# Patient Record
Sex: Male | Born: 1940 | Race: White | Hispanic: No | Smoking: Never smoker
Health system: Southern US, Community
[De-identification: ages and names within clinical notes are randomized; demographics above are authoritative.]

## PROBLEM LIST (undated history)

## (undated) DIAGNOSIS — E785 Hyperlipidemia, unspecified: Secondary | ICD-10-CM

## (undated) DIAGNOSIS — I10 Essential (primary) hypertension: Secondary | ICD-10-CM

## (undated) DIAGNOSIS — N400 Enlarged prostate without lower urinary tract symptoms: Secondary | ICD-10-CM

## (undated) HISTORY — DX: Hyperlipidemia, unspecified: E78.5

## (undated) HISTORY — DX: Benign prostatic hyperplasia without lower urinary tract symptoms: N40.0

## (undated) HISTORY — DX: Essential (primary) hypertension: I10

---

## 1978-03-13 HISTORY — PX: FINGER CONTRACTURE RELEASE: SHX637

## 2007-01-30 ENCOUNTER — Ambulatory Visit: Payer: Self-pay | Admitting: Internal Medicine

## 2007-01-30 DIAGNOSIS — I739 Peripheral vascular disease, unspecified: Secondary | ICD-10-CM

## 2007-01-30 DIAGNOSIS — G20 Parkinson's disease: Secondary | ICD-10-CM | POA: Insufficient documentation

## 2007-01-30 DIAGNOSIS — R7301 Impaired fasting glucose: Secondary | ICD-10-CM | POA: Insufficient documentation

## 2007-02-05 ENCOUNTER — Encounter: Payer: Self-pay | Admitting: Family Medicine

## 2007-02-05 ENCOUNTER — Ambulatory Visit: Payer: Self-pay

## 2007-02-25 ENCOUNTER — Ambulatory Visit: Payer: Self-pay | Admitting: Internal Medicine

## 2007-05-16 ENCOUNTER — Encounter: Payer: Self-pay | Admitting: Internal Medicine

## 2007-05-23 ENCOUNTER — Encounter: Payer: Self-pay | Admitting: Internal Medicine

## 2007-05-30 LAB — CONVERTED CEMR LAB
ALT: 28 U/L
AST: 20 U/L
Albumin: 4.3 g/dL
Alkaline Phosphatase: 52 U/L
BUN: 15 mg/dL
Basophils Absolute: 0 K/uL
Basophils Relative: 0.1 %
Bilirubin, Direct: 0.1 mg/dL
CO2: 30 meq/L
Calcium: 9.4 mg/dL
Chloride: 106 meq/L
Cholesterol: 198 mg/dL
Creatinine, Ser: 0.9 mg/dL
Creatinine,U: 168.7 mg/dL
Eosinophils Absolute: 0 K/uL
Eosinophils Relative: 0.3 %
GFR calc Af Amer: 108 mL/min
GFR calc non Af Amer: 89 mL/min
Glucose, Bld: 161 mg/dL — ABNORMAL HIGH
HCT: 45.8 %
HDL: 39.9 mg/dL
Hemoglobin: 15.9 g/dL
Hgb A1c MFr Bld: 6.6 % — ABNORMAL HIGH
LDL Cholesterol: 142 mg/dL — ABNORMAL HIGH
Lymphocytes Relative: 17.2 %
MCHC: 34.6 g/dL
MCV: 92.4 fL
Microalb Creat Ratio: 19.6 mg/g
Microalb, Ur: 3.3 mg/dL — ABNORMAL HIGH
Monocytes Absolute: 0.5 K/uL
Monocytes Relative: 6.2 %
Neutro Abs: 5.8 K/uL
Neutrophils Relative %: 76.2 %
PSA: 0.69 ng/mL
Phosphorus: 3.2 mg/dL
Platelets: 152 K/uL
Potassium: 4.4 meq/L
RBC: 4.96 M/uL
RDW: 12.5 %
Sed Rate: 4 mm/h
Sodium: 142 meq/L
TSH: 1.05 u[IU]/mL
Total Bilirubin: 0.8 mg/dL
Total CHOL/HDL Ratio: 5
Total CK: 114 U/L
Total Protein: 7.1 g/dL
Triglycerides: 83 mg/dL
VLDL: 17 mg/dL
WBC: 7.6 10*3/microliter

## 2007-06-14 ENCOUNTER — Encounter: Payer: Self-pay | Admitting: Internal Medicine

## 2007-06-24 ENCOUNTER — Encounter: Payer: Self-pay | Admitting: Internal Medicine

## 2007-07-26 ENCOUNTER — Encounter: Payer: Self-pay | Admitting: Internal Medicine

## 2007-08-30 ENCOUNTER — Ambulatory Visit: Payer: Self-pay | Admitting: Internal Medicine

## 2007-08-30 DIAGNOSIS — E785 Hyperlipidemia, unspecified: Secondary | ICD-10-CM | POA: Insufficient documentation

## 2007-08-30 DIAGNOSIS — M720 Palmar fascial fibromatosis [Dupuytren]: Secondary | ICD-10-CM

## 2007-09-02 LAB — CONVERTED CEMR LAB
BUN: 20 mg/dL (ref 6–23)
Basophils Relative: 0 % (ref 0.0–1.0)
Calcium: 8.8 mg/dL (ref 8.4–10.5)
Creatinine, Ser: 1 mg/dL (ref 0.4–1.5)
Creatinine,U: 157.4 mg/dL
GFR calc Af Amer: 95 mL/min
GFR calc non Af Amer: 79 mL/min
Glucose, Bld: 122 mg/dL — ABNORMAL HIGH (ref 70–99)
Hgb A1c MFr Bld: 6.2 % — ABNORMAL HIGH (ref 4.6–6.0)
Microalb Creat Ratio: 8.9 mg/g (ref 0.0–30.0)
Monocytes Absolute: 0.5 10*3/uL (ref 0.1–1.0)
Monocytes Relative: 7.7 % (ref 3.0–12.0)
Neutro Abs: 4.3 10*3/uL (ref 1.4–7.7)
Neutrophils Relative %: 68.3 % (ref 43.0–77.0)
Phosphorus: 3.3 mg/dL (ref 2.3–4.6)
Potassium: 4.1 meq/L (ref 3.5–5.1)
RDW: 12.4 % (ref 11.5–14.6)
TSH: 1.28 microintl units/mL (ref 0.35–5.50)

## 2009-05-19 ENCOUNTER — Emergency Department: Payer: Self-pay | Admitting: Internal Medicine

## 2009-06-14 ENCOUNTER — Ambulatory Visit: Payer: Self-pay | Admitting: Internal Medicine

## 2009-06-14 DIAGNOSIS — N401 Enlarged prostate with lower urinary tract symptoms: Secondary | ICD-10-CM

## 2009-06-14 DIAGNOSIS — N138 Other obstructive and reflux uropathy: Secondary | ICD-10-CM

## 2009-06-14 LAB — CONVERTED CEMR LAB
Glucose, Urine, Semiquant: NEGATIVE
Nitrite: NEGATIVE
WBC Urine, dipstick: NEGATIVE

## 2009-06-14 LAB — HM DIABETES FOOT EXAM

## 2009-06-15 LAB — CONVERTED CEMR LAB
ALT: 19 units/L (ref 0–53)
AST: 16 units/L (ref 0–37)
Alkaline Phosphatase: 62 units/L (ref 39–117)
BUN: 14 mg/dL (ref 6–23)
Bilirubin, Direct: 0.1 mg/dL (ref 0.0–0.3)
Creatinine, Ser: 1 mg/dL (ref 0.4–1.5)
Eosinophils Absolute: 0.2 10*3/uL (ref 0.0–0.7)
GFR calc non Af Amer: 78.32 mL/min (ref >60)
Lymphocytes Relative: 17.8 % (ref 12.0–46.0)
MCHC: 35.6 g/dL (ref 30.0–36.0)
MCV: 90.2 fL (ref 78.0–100.0)
Monocytes Absolute: 0.5 10*3/uL (ref 0.1–1.0)
Platelets: 166 10*3/uL (ref 150.0–400.0)
Potassium: 4.4 meq/L (ref 3.5–5.1)
RBC: 4.94 M/uL (ref 4.22–5.81)
RDW: 13.3 % (ref 11.5–14.6)
Sodium: 144 meq/L (ref 135–145)
Total Bilirubin: 0.5 mg/dL (ref 0.3–1.2)

## 2009-09-07 ENCOUNTER — Ambulatory Visit: Payer: Self-pay | Admitting: Internal Medicine

## 2009-09-07 DIAGNOSIS — I10 Essential (primary) hypertension: Secondary | ICD-10-CM

## 2009-10-17 ENCOUNTER — Encounter: Payer: Self-pay | Admitting: Internal Medicine

## 2009-10-17 ENCOUNTER — Inpatient Hospital Stay (HOSPITAL_COMMUNITY): Admission: EM | Admit: 2009-10-17 | Discharge: 2009-10-26 | Payer: Self-pay | Admitting: Emergency Medicine

## 2009-10-20 ENCOUNTER — Ambulatory Visit: Payer: Self-pay | Admitting: Infectious Diseases

## 2009-10-25 ENCOUNTER — Ambulatory Visit: Payer: Self-pay | Admitting: Physical Medicine & Rehabilitation

## 2009-10-25 ENCOUNTER — Telehealth: Payer: Self-pay | Admitting: Internal Medicine

## 2009-10-26 ENCOUNTER — Inpatient Hospital Stay (HOSPITAL_COMMUNITY)
Admission: RE | Admit: 2009-10-26 | Discharge: 2009-11-08 | Payer: Self-pay | Admitting: Physical Medicine & Rehabilitation

## 2009-10-29 ENCOUNTER — Ambulatory Visit: Payer: Self-pay | Admitting: Physical Medicine & Rehabilitation

## 2009-11-08 ENCOUNTER — Encounter: Payer: Self-pay | Admitting: Internal Medicine

## 2009-11-22 ENCOUNTER — Ambulatory Visit: Payer: Self-pay | Admitting: Internal Medicine

## 2009-11-22 LAB — CONVERTED CEMR LAB
Blood in Urine, dipstick: NEGATIVE
WBC Urine, dipstick: NEGATIVE
pH: 6

## 2009-12-24 ENCOUNTER — Ambulatory Visit: Payer: Self-pay | Admitting: Internal Medicine

## 2009-12-24 DIAGNOSIS — I872 Venous insufficiency (chronic) (peripheral): Secondary | ICD-10-CM | POA: Insufficient documentation

## 2010-02-23 ENCOUNTER — Ambulatory Visit: Payer: Self-pay | Admitting: Internal Medicine

## 2010-03-09 ENCOUNTER — Ambulatory Visit: Payer: Self-pay | Admitting: Internal Medicine

## 2010-04-14 NOTE — Miscellaneous (Signed)
Summary: Case Mgmt Form/[facility]  Case Mgmt Form/[facility]   Imported By: Lanelle Bal 12/03/2009 09:08:34  _____________________________________________________________________  External Attachment:    Type:   Image     Comment:   External Document

## 2010-04-14 NOTE — Assessment & Plan Note (Signed)
Summary: 4 M F/U DLO   Vital Signs:  Patient profile:   70 year old male Height:      70.75 inches Weight:      212 pounds Temp:     98.4 degrees F oral Pulse rate:   60 / minute Pulse rhythm:   regular BP sitting:   150 / 90  (left arm) Cuff size:   large  Vitals Entered By: Mervin Hack CMA Duncan Dull) (September 07, 2009 10:26 AM) CC: 57month follow-up   History of Present Illness: doing okay  went to another neurologist-- Dr Hyacinth Meeker in [facility] started zelink--MAOI inhibitor didn't help after 5 weeks so stopped didn't try the tamsulosin because of this Stiffness is some better with peanut oil topically  Now on saw palmetto sleeping through the night some daytime frequency but not troubling  BP still up a bit he feels it is something he can work on   Allergies: No Known Drug Allergies  Past History:  Past medical, surgical, family and social histories (including risk factors) reviewed for relevance to current acute and chronic problems.  Past Medical History: Diabetes mellitus, type II 2001  Has been diet controlled Parkinsons Hyperlipidemia Benign prostatic hypertrophy Hypertension  Past Surgical History: Reviewed history from 06/14/2009 and no changes required. 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 Dad died of CVA @76  Mom died @84 -- had jaundice 5 siblings 1 sister died as teen of meningitis No CAD, DM Mom had HTN No colon or prostate cancer  Social History: Reviewed history from 01/30/2007 and no changes required. 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     Psych:  normally interactive, good eye contact, not anxious appearing, and not depressed appearing.     Impression & Recommendations:  Problem # 1:  BENIGN PROSTATIC HYPERTROPHY (ICD-600.00) Assessment Comment Only doing okay on saw palmetto only took flomax for 1 week--not clear if it helped  Problem # 2:  PARKINSONISM (ICD-332.0) Assessment: Comment Only now seeing Dr Hyacinth Meeker  no meds failed sinemet twice  Problem # 3:  DIABETES MELLITUS, TYPE II (ICD-250.00) Assessment: Unchanged good control  Labs Reviewed: Creat: 1.0 (06/14/2009)    Reviewed HgBA1c results: 6.0 (06/14/2009)  6.2 (08/30/2007)  Problem # 4:  HYPERTENSION (ICD-401.9) Assessment: Comment Only discussed borderline positive microal also recommended meds--he prefers not  BP today: 150/90 Prior BP: 168/88 (06/14/2009)  Labs Reviewed: K+: 4.4 (06/14/2009) Creat: : 1.0 (06/14/2009)   Chol: 198 (01/30/2007)   HDL: 39.9 (01/30/2007)   LDL: 142 (01/30/2007)   TG: 83 (01/30/2007)  Complete Medication List: 1)  Vitamin D3 5000 Unit/ml Liqd (Cholecalciferol) .... Once daily 2)  Alpha-lipoic Acid 300 Mg Caps (Alpha-lipoic acid) .... Once daily 3)  Acetyl L-carnitine 500 Mg Caps (Acetylcarnitine hcl) .... Once daily 4)  Ginkgo Biloba 40 Mg Caps (Ginkgo biloba) .... Once daily 5)  Vitamin B-12 100 Mcg Tabs (Cyanocobalamin) .... Once daily 6)  Resveratrol 100 Mg Caps (Resveratrol) .... Once daily 7)  Cyto-q 80 Mg/84ml Liqd (Ubiquinol liposomal) .... Once daily 8)  Nattokinase 100 Mg Caps (Nattokinase) .... Once daily 9)  Fish Oil 1000  Mg Caps (Omega-3 fatty acids) .... Once daily 10)  Oil of Oregano 1500 Mg Caps (Oregano) .... Once daily 11)  Selenium 200 Mcg Tabs (Selenium) .... Once daily 12)  Garlic Oil 1000 Mg Caps (Garlic) .... Once daily 13)  Saw Palmetto 450 Mg Caps (Saw palmetto (serenoa repens)) .... Take 1 by mouth once daily  Patient Instructions: 1)  Please schedule a follow-up appointment in 6  months .   Current Allergies (reviewed today): No known allergies

## 2010-04-14 NOTE — Assessment & Plan Note (Signed)
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 Dad died of CVA @76  Mom died @84 -- had jaundice 5 siblings 1 sister died as teen of meningitis No CAD, DM Mom had HTN No colon or prostate cancer  Social History: Reviewed history from 01/30/2007 and no changes required. 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  NAD Neck:  supple,  no masses, no thyromegaly, no carotid bruits, and no cervical lymphadenopathy.   Lungs:  normal respiratory effort and normal breath sounds.   Heart:  normal rate, regular rhythm, no murmur, and no gallop.   Abdomen:  soft and non-tender.   No suprapubic dullness Neurologic:  moderate bradykinesia scant resting tremor Mild cog wheel rigidity Skin:  no suspicious lesions and no ulcerations.   Psych:  normally interactive, good eye contact, not anxious appearing, and not depressed appearing.    Diabetes Management Exam:    Foot Exam (with socks and/or shoes not present):       Sensory-Pinprick/Light touch:          Left medial foot (L-4): normal          Left dorsal foot (L-5): normal          Left lateral foot (S-1): normal          Right medial foot (L-4): normal          Right dorsal foot (L-5): normal          Right lateral foot (S-1): normal       Inspection:          Left foot: normal          Right foot: normal       Nails:          Left foot: fungal  infection          Right foot: fungal infection   Impression & Recommendations:  Problem # 1:  BENIGN PROSTATIC HYPERTROPHY (ICD-600.00) Assessment New  recent UTI but no now highly symptomatic will try tamsulosin---discussed possible orthostasis change to finasteride if not better  Orders: Prescription Created Electronically [phone]) UA Dipstick w/o Micro (manual) (60454)  Problem # 2:  DIABETES MELLITUS, TYPE II (ICD-250.00) Assessment: Unchanged  will recheck labs doesn't check and doesn't want meds BP up some but not clear this is consistent---would not start meds when starting tamsulosin anyway  Labs Reviewed: Creat: 1.0 (08/30/2007)    Reviewed HgBA1c results: 6.2 (08/30/2007)  6.6 (01/30/2007)  Orders: TLB-A1C / Hgb A1C (Glycohemoglobin) (83036-A1C) TLB-Renal Function Panel (80069-RENAL) TLB-CBC Platelet - w/Differential (85025-CBCD) TLB-Hepatic/Liver Function Pnl (80076-HEPATIC) TLB-TSH (Thyroid  Stimulating Hormone) (84443-TSH) Venipuncture (09811) TLB-Microalbumin/Creat Ratio, Urine (82043-MALB)  Problem # 3:  PARKINSONISM (ICD-332.0) Assessment: Deteriorated slow deterioration Dr Kennyth Arnold manages  Complete Medication List: 1)  Tamsulosin Hcl 0.4 Mg Caps (Tamsulosin hcl) .Marland Kitchen.. 1 tab daily for enlarged prostate 2)  Vitamin D3 5000 Unit/ml Liqd (Cholecalciferol) .... Once daily 3)  Alpha-lipoic Acid 300 Mg Caps (Alpha-lipoic acid) .... Once daily 4)  Acetyl L-carnitine 500 Mg Caps (Acetylcarnitine hcl) .... Once daily 5)  Ginkgo Biloba 40 Mg Caps (Ginkgo biloba) .... Once daily 6)  Vitamin B-12 100 Mcg Tabs (Cyanocobalamin) .... Once daily 7)  Resveratrol 100 Mg Caps (Resveratrol) .... Once daily 8)  Cyto-q 80 Mg/28ml Liqd (Ubiquinol liposomal) .... Once daily 9)  Nattokinase 100 Mg Caps (Nattokinase) .... Once daily 10)  Fish Oil 1000 Mg Caps (Omega-3 fatty acids) .... Once daily 11)  Oil of Oregano 1500 Mg Caps (Oregano) .... Once daily 12)  Selenium 200 Mcg Tabs (Selenium) .... Once daily 13)  Garlic Oil 1000 Mg Caps (Garlic) .... Once daily  Patient Instructions: 1)  Please call within about 2 weeks if the tamsulosin doesn't work. 2)  Please schedule a follow-up appointment in 3-4  months .  Prescriptions: TAMSULOSIN HCL 0.4 MG CAPS (TAMSULOSIN HCL) 1 tab daily for enlarged prostate  #30 x 11   Entered and Authorized by:   Cindee Salt MD   Signed by:   Cindee Salt MD on 06/14/2009   Method used:   Electronically to        [facility]  [address] [id number] (retail)       [address]       [place], [place]  [address]       Ph: [phone] or [phone]       Fax: [phone]   RxID:   [id number]   Current Allergies (reviewed today): No known allergies   Laboratory Results   Urine Tests  Date/Time Received: June 14, 2009 10:38 AM Date/Time Reported: June 14, 2009 10:38 AM  Routine Urinalysis   Color: yellow Appearance: Hazy Glucose:  negative   (Normal Range: Negative) Bilirubin: negative   (Normal Range: Negative) Ketone: negative   (Normal Range: Negative) Spec. Gravity: 1.025   (Normal Range: 1.003-1.035) Blood: negative   (Normal Range: Negative) pH: 5.5   (Normal Range: 5.0-8.0) Protein: trace   (Normal Range: Negative) Urobilinogen: 0.2   (Normal Range: 0-1) Nitrite: negative   (Normal Range: Negative) Leukocyte Esterace: negative   (Normal Range: Negative)

## 2010-04-14 NOTE — Assessment & Plan Note (Signed)
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  Past Medical History: Reviewed history from 09/07/2009 and no changes required. Diabetes mellitus, type II 2001  Has been diet controlled Parkinsons Hyperlipidemia Benign prostatic hypertrophy Hypertension  Past Surgical History: Reviewed history from 06/14/2009 and no changes required. 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 Dad died of CVA @76  Mom died @84 -- had jaundice 5 siblings 1 sister died as teen of meningitis No CAD, DM Mom had HTN No colon or prostate cancer  Social History: Reviewed history from 01/30/2007 and no changes required. 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   Neck:  supple, no masses, no thyromegaly, and no cervical lymphadenopathy.   Lungs:  normal respiratory effort, no intercostal retractions, no accessory muscle use, and normal breath sounds.   Heart:  normal rate, regular rhythm, no murmur, and no gallop.   Msk:  no joint tenderness and no joint swelling.   Extremities:  no edema Neurologic:  alert & oriented X3.   Moderate bradykinesia but fairly normal tone and no sig resting tremor Slow but stable walking with cane Psych:  normally interactive, good eye contact, not anxious appearing, and not depressed appearing.     Impression & Recommendations:  Problem # 1:  EDEMA (ICD-782.3) Assessment Improved better off the amlodipine no further intervention  Problem # 2:  HYPERTENSION (ICD-401.9) Assessment: Deteriorated up some but still not unacceptable for age will try without meds but consider restarting the metoprolol  The following medications were removed from the medication list:    Metoprolol Tartrate 25 Mg Tabs (Metoprolol tartrate) .Marland Kitchen... Take 1 by mouth two times a day  BP today: 158/80 Prior BP: 130/60 (12/24/2009)  Labs Reviewed: K+: 4.4 (06/14/2009) Creat: : 1.0 (06/14/2009)   Chol: 198 (01/30/2007)   HDL: 39.9 (01/30/2007)   LDL: 142 (01/30/2007)   TG: 83 (01/30/2007)  Problem # 3:  PARKINSONISM (ICD-332.0) Assessment: Unchanged stable functional status----close  to baseline before the diarrheal illness  Problem # 4:  DIABETES MELLITUS, TYPE II (ICD-250.00) Assessment: Unchanged blood work next time  Labs Reviewed: Creat: 1.0 (06/14/2009)    Reviewed HgBA1c results: 6.0 (06/14/2009)  6.2 (08/30/2007)  Complete Medication List: 1)  Vitamin D3 5000 Unit/ml Liqd (Cholecalciferol) .... Once daily  Patient  Instructions: 1)  Please schedule a follow-up appointment in 6 months for Medicare Wellness visit   Orders Added: 1)  Est. Patient Level IV [16109]    Current Allergies (reviewed today): No known allergies

## 2010-04-14 NOTE — Progress Notes (Signed)
  Phone Note Call from Patient Call back at Home Phone [phone]   Caller: Alan Peterson Call For: [facility] Summary of Call: I called pt. to set up f/u visit from the Hospital.  Pt's son,Alan Peterson,answered and said pt. is still in the hospital and will be going to rehab after he is released from the hospital for physical therapy to help w/ his weakness.  I asked his son to call after pt. finishes rehab to schedule a f/u appt. w/ Dr.Letvak. Initial call taken by: Beau Fanny,  October 25, 2009 9:42 AM  Follow-up for Phone Call        Okay that sounds good Follow-up by: Cindee Salt MD,  October 25, 2009 10:36 AM

## 2010-04-14 NOTE — Miscellaneous (Signed)
Summary: Professional Communication/[facility]  Professional Communication/[facility]   Imported By: Sherian Rein 11/26/2009 15:06:23  _____________________________________________________________________  External Attachment:    Type:   Image     Comment:   External Document

## 2010-04-14 NOTE — Assessment & Plan Note (Signed)
Summary: F/U HOSP,REHAB/CLE   Vital Signs:  Patient profile:   70 year old male Weight:      200 pounds Temp:     98.6 degrees F oral Pulse rate:   60 / minute Pulse rhythm:   regular BP sitting:   130 / 68  (left arm) Cuff size:   large  Vitals Entered By: Mervin Hack CMA Duncan Dull) (November 22, 2009 10:52 AM) CC: hospital follow-up   History of Present Illness: Hospitalized for diarrhea--Salmonella Then rehab for 2 weeks No diarrhea now  Appetite is fine weight is down though  No strength now Can't make it to work ---has to manage over the phone Generally sleeps okay-not tired  Has had nursing care at home--1 more visit Done with formal PT  Voiding okay no dysuria  some edema predated his illness  Allergies: No Known Drug Allergies  Review of Systems       The patient complains of dyspnea on exertion.  The patient denies chest pain, syncope, and abdominal pain.         weight is down 12#  Physical Exam  General:  alert.  NAD Neck:  supple, no masses, and no thyromegaly.   Lungs:  normal respiratory effort, no intercostal retractions, no accessory muscle use, and normal breath sounds.   Heart:  normal rate, regular rhythm, no murmur, and no gallop.   Abdomen:  soft and non-tender.   Extremities:  no edema mycotic toenails Neurologic:  marked bradykinesia and mild increased tone not miuch tremor Psych:  normally interactive, good eye contact, not anxious appearing, and not depressed appearing.     Impression & Recommendations:  Problem # 1:  FATIGUE (ICD-780.79) Assessment Comment Only post hospitalization and rehab slow improvement but not near previous baseline will continue home exercises eating well  Problem # 2:  HYPERTENSION (ICD-401.9) Assessment: Unchanged BP okay no changes needed consider cutting amlodipine--esp if ongoing edema   His updated medication list for this problem includes:    Amlodipine Besylate 10 Mg Tabs  (Amlodipine besylate) .Marland Kitchen... Take 1 by mouth  once daily    Metoprolol Tartrate 25 Mg Tabs (Metoprolol tartrate) .Marland Kitchen... Take 1 by mouth two times a day  BP today: 130/68 Prior BP: 150/90 (09/07/2009)  Labs Reviewed: K+: 4.4 (06/14/2009) Creat: : 1.0 (06/14/2009)   Chol: 198 (01/30/2007)   HDL: 39.9 (01/30/2007)   LDL: 142 (01/30/2007)   TG: 83 (01/30/2007)  Problem # 3:  BENIGN PROSTATIC HYPERTROPHY (ICD-600.00) Assessment: Unchanged voiding okay without meds or supplements for now  Complete Medication List: 1)  Amlodipine Besylate 10 Mg Tabs (Amlodipine besylate) .... Take 1 by mouth  once daily 2)  Metoprolol Tartrate 25 Mg Tabs (Metoprolol tartrate) .... Take 1 by mouth two times a day 3)  Vitamin D3 5000 Unit/ml Liqd (Cholecalciferol) .... Once daily  Patient Instructions: 1)  Please schedule a follow-up appointment in 1 month.   Current Allergies (reviewed today): No known allergies   Laboratory Results   Urine Tests  Date/Time Received: November 22, 2009 11:31 AM Date/Time Reported: November 22, 2009 11:31 AM  Routine Urinalysis   Color: yellow Appearance: Hazy Glucose: negative   (Normal Range: Negative) Bilirubin: negative   (Normal Range: Negative) Ketone: negative   (Normal Range: Negative) Spec. Gravity: 1.020   (Normal Range: 1.003-1.035) Blood: negative   (Normal Range: Negative) pH: 6.0   (Normal Range: 5.0-8.0) Protein: negative   (Normal Range: Negative) Urobilinogen: 0.2   (Normal Range: 0-1) Nitrite: negative   (  Normal Range: Negative) Leukocyte Esterace: negative   (Normal Range: Negative)

## 2010-04-14 NOTE — Assessment & Plan Note (Signed)
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  Past Medical History: Reviewed history from 09/07/2009 and no changes required. Diabetes mellitus, type II 2001  Has been diet controlled Parkinsons Hyperlipidemia Benign prostatic hypertrophy Hypertension  Past Surgical History: Reviewed history from 06/14/2009 and no changes required. 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 Dad died of CVA @76  Mom died @84 -- had jaundice 5 siblings 1 sister died as teen of meningitis No CAD, DM Mom had HTN No colon or prostate cancer  Social History: Reviewed history from 01/30/2007 and no changes required. 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   Neck:  supple, no masses, and no thyromegaly.   Lungs:  normal respiratory effort, no intercostal retractions, no accessory muscle use, and normal breath sounds.   Heart:  normal rate, regular rhythm, no murmur, and no gallop.   Extremities:  1+ non pitting edema bilaterally Neurologic:  mild increased tone mild bradykinesia flat facial expression Psych:  not anxious appearing and not depressed appearing.     Impression & Recommendations:  Problem # 1:  EDEMA (ICD-782.3) Assessment New may be from the amlodipine ??from malnurtrition and decreased proteins  BP fine so will try off the amlodipine  Problem # 2:  HYPERTENSION (ICD-401.9) Assessment: Unchanged good control try off the amlodipine  The following medications were removed from the medication list:    Amlodipine Besylate 10 Mg Tabs (Amlodipine besylate) .Marland Kitchen... Take 1 by mouth  once daily His updated medication list for this problem includes:    Metoprolol Tartrate 25 Mg Tabs (Metoprolol tartrate) .Marland Kitchen... Take 1 by mouth two times a day  BP today: 130/60 Prior BP: 130/68 (11/22/2009)  Labs Reviewed: K+: 4.4 (06/14/2009) Creat: : 1.0 (06/14/2009)   Chol: 198 (01/30/2007)   HDL: 39.9 (01/30/2007)   LDL: 142 (01/30/2007)   TG: 83 (01/30/2007)  Problem # 3:  PARKINSONISM (ICD-332.0) Assessment: Comment Only stable status has slowed his recovery  Complete Medication List: 1)  Metoprolol Tartrate 25 Mg Tabs (Metoprolol tartrate) .... Take 1 by mouth two times a day 2)  Vitamin D3 5000 Unit/ml Liqd (  Cholecalciferol) .... Once daily  Patient Instructions: 1)  Stop the amlodipine 2)  Please schedule a follow-up appointment in 2 months.   Current Allergies (reviewed today): No known allergies

## 2010-04-14 NOTE — Letter (Signed)
Summary: [facility] Triage Call Report  [facility] Triage Call Report   Imported By: Beau Fanny 10/18/2009 11:15:56  _____________________________________________________________________  External Attachment:    Type:   Image     Comment:   External Document  Appended Document: [facility] Triage Call Report    Phone Note Outgoing Call   Summary of Call: Please check on him Initial call taken by: Cindee Salt MD,  October 18, 2009 11:35 AM  Follow-up for Phone Call        Left message on machine for patient to call back. Sydell Axon LPN  October 18, 2009 1:17 PM  Spoke to patient's son and was advised that he took his dad to [facility] yesterday and they kept him.  Patient' son stated that they have been giving him IV fluids, running test and trying to find out why he is having diarrhea. Patient's son said that he is not doing any better and still having diarrhea today.  I requested patient's son [name] to keep Dr. Alphonsus Sias updated on patient's condition. Follow-up by: Sydell Axon LPN,  October 18, 2009 3:31 PM  Additional Follow-up for Phone Call Additional follow up Details #1::        In 1510 diarrhea better no diagnosis from stool studies May be able to go home tomorrow Cindee Salt MD  October 19, 2009 2:13 PM   Feeling some better Had Salmonella in urine and stool Antibiotics being adjusted Hopes to go home tomorrow will set up follow up next week Additional Follow-up by: Cindee Salt MD,  October 21, 2009 1:12 PM

## 2010-05-27 LAB — CBC
HCT: 36.2 % — ABNORMAL LOW (ref 39.0–52.0)
HCT: 37.1 % — ABNORMAL LOW (ref 39.0–52.0)
HCT: 47.8 % (ref 39.0–52.0)
Hemoglobin: 13.2 g/dL (ref 13.0–17.0)
Hemoglobin: 14.7 g/dL (ref 13.0–17.0)
MCH: 31.5 pg (ref 26.0–34.0)
MCH: 31.7 pg (ref 26.0–34.0)
MCHC: 34.5 g/dL (ref 30.0–36.0)
MCHC: 34.8 g/dL (ref 30.0–36.0)
MCHC: 34.7 g/dL (ref 30.0–36.0)
MCHC: 35.5 g/dL (ref 30.0–36.0)
MCHC: 35.5 g/dL (ref 30.0–36.0)
MCV: 88.9 fL (ref 78.0–100.0)
MCV: 89.4 fL (ref 78.0–100.0)
MCV: 90.7 fL (ref 78.0–100.0)
MCV: 91.1 fL (ref 78.0–100.0)
Platelets: 188 10*3/uL (ref 150–400)
Platelets: 192 10*3/uL (ref 150–400)
Platelets: 119 10*3/uL — ABNORMAL LOW (ref 150–400)
Platelets: 124 10*3/uL — ABNORMAL LOW (ref 150–400)
Platelets: 148 10*3/uL — ABNORMAL LOW (ref 150–400)
Platelets: 156 10*3/uL (ref 150–400)
Platelets: 172 10*3/uL (ref 150–400)
RBC: 4.45 MIL/uL (ref 4.22–5.81)
RBC: 3.99 MIL/uL — ABNORMAL LOW (ref 4.22–5.81)
RBC: 4.05 MIL/uL — ABNORMAL LOW (ref 4.22–5.81)
RBC: 4.18 MIL/uL — ABNORMAL LOW (ref 4.22–5.81)
RBC: 4.55 MIL/uL (ref 4.22–5.81)
RBC: 5.33 MIL/uL (ref 4.22–5.81)
RDW: 12.9 % (ref 11.5–15.5)
RDW: 13.1 % (ref 11.5–15.5)
RDW: 13 % (ref 11.5–15.5)
RDW: 13.4 % (ref 11.5–15.5)
RDW: 13.6 % (ref 11.5–15.5)
WBC: 7.3 10*3/uL (ref 4.0–10.5)
WBC: 5.4 10*3/uL (ref 4.0–10.5)
WBC: 6.8 10*3/uL (ref 4.0–10.5)
WBC: 7.2 10*3/uL (ref 4.0–10.5)
WBC: 7.4 10*3/uL (ref 4.0–10.5)
WBC: 8.6 10*3/uL (ref 4.0–10.5)
WBC: 9.4 10*3/uL (ref 4.0–10.5)

## 2010-05-27 LAB — URINE CULTURE
Colony Count: >=100000
Culture  Setup Time: 201108072129
Culture  Setup Time: 201108120042
Culture  Setup Time: 201108130122
Special Requests: NEGATIVE

## 2010-05-27 LAB — BASIC METABOLIC PANEL
BUN: 10 mg/dL (ref 6–23)
BUN: 11 mg/dL (ref 6–23)
BUN: 14 mg/dL (ref 6–23)
BUN: 16 mg/dL (ref 6–23)
BUN: 8 mg/dL (ref 6–23)
CO2: 30 mEq/L (ref 19–32)
CO2: 24 mEq/L (ref 19–32)
CO2: 29 mEq/L (ref 19–32)
Calcium: 7.9 mg/dL — ABNORMAL LOW (ref 8.4–10.5)
Calcium: 8.1 mg/dL — ABNORMAL LOW (ref 8.4–10.5)
Calcium: 8.3 mg/dL — ABNORMAL LOW (ref 8.4–10.5)
Chloride: 102 mEq/L (ref 96–112)
Chloride: 106 mEq/L (ref 96–112)
Chloride: 109 mEq/L (ref 96–112)
Chloride: 109 mEq/L (ref 96–112)
Chloride: 110 mEq/L (ref 96–112)
Chloride: 112 mEq/L (ref 96–112)
Creatinine, Ser: 0.73 mg/dL (ref 0.4–1.5)
Creatinine, Ser: 0.76 mg/dL (ref 0.4–1.5)
Creatinine, Ser: 0.78 mg/dL (ref 0.4–1.5)
Creatinine, Ser: 1.01 mg/dL (ref 0.4–1.5)
Creatinine, Ser: 1.04 mg/dL (ref 0.4–1.5)
GFR calc Af Amer: >60 mL/min (ref >60)
GFR calc Af Amer: >60 mL/min (ref >60)
GFR calc Af Amer: >60 mL/min (ref >60)
GFR calc Af Amer: >60 mL/min (ref >60)
GFR calc non Af Amer: >60 mL/min (ref >60)
GFR calc non Af Amer: >60 mL/min (ref >60)
GFR calc non Af Amer: >60 mL/min (ref >60)
Glucose, Bld: 123 mg/dL — ABNORMAL HIGH (ref 70–99)
Glucose, Bld: 126 mg/dL — ABNORMAL HIGH (ref 70–99)
Glucose, Bld: 132 mg/dL — ABNORMAL HIGH (ref 70–99)
Potassium: 3.9 mEq/L (ref 3.5–5.1)
Potassium: 2.5 mEq/L — CL (ref 3.5–5.1)
Potassium: 2.7 mEq/L — CL (ref 3.5–5.1)
Potassium: 4.3 mEq/L (ref 3.5–5.1)
Sodium: 137 mEq/L (ref 135–145)
Sodium: 138 mEq/L (ref 135–145)
Sodium: 140 mEq/L (ref 135–145)

## 2010-05-27 LAB — COMPREHENSIVE METABOLIC PANEL
ALT: 16 U/L (ref 0–53)
AST: 19 U/L (ref 0–37)
Albumin: 3 g/dL — ABNORMAL LOW (ref 3.5–5.2)
Albumin: 3.6 g/dL (ref 3.5–5.2)
BUN: 11 mg/dL (ref 6–23)
CO2: 23 mEq/L (ref 19–32)
Calcium: 7.9 mg/dL — ABNORMAL LOW (ref 8.4–10.5)
Chloride: 103 mEq/L (ref 96–112)
Chloride: 116 mEq/L — ABNORMAL HIGH (ref 96–112)
Creatinine, Ser: 0.85 mg/dL (ref 0.4–1.5)
GFR calc Af Amer: >60 mL/min (ref >60)
GFR calc Af Amer: >60 mL/min (ref >60)
GFR calc non Af Amer: 59 mL/min — ABNORMAL LOW (ref >60)
GFR calc non Af Amer: >60 mL/min (ref >60)
Glucose, Bld: 143 mg/dL — ABNORMAL HIGH (ref 70–99)
Potassium: 2.4 mEq/L — CL (ref 3.5–5.1)
Sodium: 139 mEq/L (ref 135–145)
Sodium: 142 mEq/L (ref 135–145)
Total Bilirubin: 1 mg/dL (ref 0.3–1.2)
Total Protein: 6 g/dL (ref 6.0–8.3)
Total Protein: 6.8 g/dL (ref 6.0–8.3)

## 2010-05-27 LAB — DIFFERENTIAL
Basophils Absolute: 0 10*3/uL (ref 0.0–0.1)
Basophils Absolute: 0 10*3/uL (ref 0.0–0.1)
Basophils Relative: 0 % (ref 0–1)
Basophils Relative: 0 % (ref 0–1)
Eosinophils Absolute: 0 10*3/uL (ref 0.0–0.7)
Lymphocytes Relative: 24 % (ref 12–46)
Lymphocytes Relative: 10 % — ABNORMAL LOW (ref 12–46)
Lymphocytes Relative: 9 % — ABNORMAL LOW (ref 12–46)
Lymphs Abs: 1.8 10*3/uL (ref 0.7–4.0)
Lymphs Abs: 0.7 10*3/uL (ref 0.7–4.0)
Monocytes Relative: 11 % (ref 3–12)
Monocytes Relative: 8 % (ref 3–12)
Neutro Abs: 5.8 10*3/uL (ref 1.7–7.7)
Neutro Abs: 5.9 10*3/uL (ref 1.7–7.7)
Neutrophils Relative %: 65 % (ref 43–77)
Neutrophils Relative %: 79 % — ABNORMAL HIGH (ref 43–77)
Neutrophils Relative %: 82 % — ABNORMAL HIGH (ref 43–77)

## 2010-05-27 LAB — OVA AND PARASITE EXAMINATION

## 2010-05-27 LAB — URINALYSIS, MICROSCOPIC ONLY
Bilirubin Urine: NEGATIVE
Hgb urine dipstick: NEGATIVE
Ketones, ur: NEGATIVE mg/dL
Nitrite: NEGATIVE
Specific Gravity, Urine: 1.015 (ref 1.005–1.030)
pH: 5 (ref 5.0–8.0)

## 2010-05-27 LAB — CLOSTRIDIUM DIFFICILE EIA: C difficile Toxins A+B, EIA: NEGATIVE

## 2010-05-27 LAB — URINALYSIS, ROUTINE W REFLEX MICROSCOPIC
Hgb urine dipstick: NEGATIVE
Nitrite: NEGATIVE
Protein, ur: 100 mg/dL — AB
Specific Gravity, Urine: 1.016 (ref 1.005–1.030)
Specific Gravity, Urine: 1.026 (ref 1.005–1.030)
pH: 5 (ref 5.0–8.0)

## 2010-05-27 LAB — HEMOGLOBIN A1C
Hgb A1c MFr Bld: 6.1 % — ABNORMAL HIGH (ref <5.7)
Mean Plasma Glucose: 128 mg/dL — ABNORMAL HIGH (ref <117)

## 2010-05-27 LAB — MAGNESIUM
Magnesium: 2.1 mg/dL (ref 1.5–2.5)
Magnesium: 2.1 mg/dL (ref 1.5–2.5)

## 2010-05-27 LAB — PHOSPHORUS: Phosphorus: 2.5 mg/dL (ref 2.3–4.6)

## 2010-05-27 LAB — STOOL CULTURE

## 2010-05-27 LAB — URINE MICROSCOPIC-ADD ON

## 2010-05-27 LAB — TSH: TSH: 0.958 u[IU]/mL (ref 0.350–4.500)

## 2010-06-07 ENCOUNTER — Telehealth: Payer: Self-pay | Admitting: *Deleted

## 2010-06-07 NOTE — Telephone Encounter (Signed)
Patient says that he has been having a lot of trouble with his ears stopping up, making it hard for him to hear out of them. He is asking if he could get referral to ENT. Prefers going to [facility].

## 2010-06-07 NOTE — Telephone Encounter (Signed)
Referral made 

## 2010-08-25 ENCOUNTER — Encounter: Payer: Self-pay | Admitting: Internal Medicine

## 2010-08-29 ENCOUNTER — Encounter: Payer: Self-pay | Admitting: Internal Medicine

## 2010-08-29 ENCOUNTER — Ambulatory Visit (INDEPENDENT_AMBULATORY_CARE_PROVIDER_SITE_OTHER): Payer: Medicare Other | Admitting: Internal Medicine

## 2010-08-29 VITALS — BP 158/80 | HR 70 | Temp 98.9°F | Ht 70.0 in | Wt 212.0 lb

## 2010-08-29 DIAGNOSIS — I10 Essential (primary) hypertension: Secondary | ICD-10-CM

## 2010-08-29 DIAGNOSIS — E785 Hyperlipidemia, unspecified: Secondary | ICD-10-CM

## 2010-08-29 DIAGNOSIS — G20 Parkinson's disease: Secondary | ICD-10-CM

## 2010-08-29 DIAGNOSIS — E119 Type 2 diabetes mellitus without complications: Secondary | ICD-10-CM

## 2010-08-29 DIAGNOSIS — Z0000 Encounter for general adult medical examination without abnormal findings: Secondary | ICD-10-CM

## 2010-08-29 DIAGNOSIS — Z23 Encounter for immunization: Secondary | ICD-10-CM

## 2010-08-29 DIAGNOSIS — R609 Edema, unspecified: Secondary | ICD-10-CM

## 2010-08-29 LAB — BASIC METABOLIC PANEL
BUN: 16 mg/dL (ref 6–23)
Calcium: 8.8 mg/dL (ref 8.4–10.5)
GFR: 98.14 mL/min (ref >60.00)
Glucose, Bld: 109 mg/dL — ABNORMAL HIGH (ref 70–99)
Sodium: 141 mEq/L (ref 135–145)

## 2010-08-29 LAB — CBC WITH DIFFERENTIAL/PLATELET
Basophils Absolute: 0 10*3/uL (ref 0.0–0.1)
Hemoglobin: 15.1 g/dL (ref 13.0–17.0)
Lymphocytes Relative: 15.3 % (ref 12.0–46.0)
Monocytes Relative: 5.3 % (ref 3.0–12.0)
Neutrophils Relative %: 78.9 % — ABNORMAL HIGH (ref 43.0–77.0)
Platelets: 158 10*3/uL (ref 150.0–400.0)
RDW: 13.1 % (ref 11.5–14.6)

## 2010-08-29 LAB — HEPATIC FUNCTION PANEL
Alkaline Phosphatase: 56 U/L (ref 39–117)
Bilirubin, Direct: 0.1 mg/dL (ref 0.0–0.3)

## 2010-08-29 LAB — LIPID PANEL
HDL: 46 mg/dL (ref >39.00)
Triglycerides: 84 mg/dL (ref 0.0–149.0)

## 2010-08-29 LAB — TSH: TSH: 1.12 u[IU]/mL (ref 0.35–5.50)

## 2010-08-29 NOTE — Assessment & Plan Note (Signed)
Reviewed his risk factors Increased risk of falls due to Parkinsons Does use cane Will update Td--doesn't want pneumovax

## 2010-08-29 NOTE — Assessment & Plan Note (Signed)
BP Readings from Last 3 Encounters:  08/29/10 158/80  02/23/10 158/80  12/24/09 130/60   Prefers no Rx Is at high risk of orthostasis due to Parkinsons

## 2010-08-29 NOTE — Assessment & Plan Note (Addendum)
Definite functional problems Can't step up on table here Can't use regular bed--will give Rx for electric bed so he doesn't have to sleep in recliner No meds  He requires frequent changes in position while in bed and he is unable to move himself properly in a regular bed. With the power feature, he can then move around as needed while in bed---as well as still allow independent transfers

## 2010-08-29 NOTE — Assessment & Plan Note (Signed)
Lab Results  Component Value Date   LDLCALC 142* 01/30/2007   Doesn't want meds for this

## 2010-08-29 NOTE — Progress Notes (Signed)
Subjective:    Patient ID: Alan Peterson, male    DOB: 1940/08/07, 70 y.o.   MRN: [id number]  HPI Had been doing well till overdoing it in lab last week On feet all day and it really wore him out Got increased ankle swelling and he is still fatigued Strength not completely back  Still no meds for Parkinson's  Nothing really helped No sig tremor Does have slow movements and is stiff  No chest pain Gets DOE with extended walking --no sig change in stamina though Sleeps in recliner--can't use a regular bed due to Parkinsons Can't get in or out of regular bed Would like to get electric bed so that he can sleep in bed again  Concerned about falls Uses cane when going to grocery store Reviewed his Medicare wellness form Discussed immunizations---he prefers to avoid Will take Td though  Current Outpatient Prescriptions on File Prior to Visit  Medication Sig Dispense Refill  . DISCONTD: Cholecalciferol (VITAMIN D3) LIQD Take by mouth daily.         Past Medical History  Diagnosis Date  . Diabetes mellitus   . Hyperlipidemia   . Hypertension   . BPH (benign prostatic hypertrophy)   . Parkinson's disease     Past Surgical History  Procedure Date  . Finger contracture release 1980    right, left 4th finger release 8/10    Family History  Problem Relation Age of Onset  . Hypertension Mother   . Coronary artery disease Neg Hx   . Diabetes Neg Hx   . Cancer Neg Hx     History   Social History  . Marital Status: Divorced    Spouse Name: N/A    Number of Children: 2  . Years of Education: N/A   Occupational History  . 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 Smoking status: Never Smoker   . Smokeless tobacco: Never Used  . Alcohol Use: Yes     beer  . Drug Use: Not on file  . Sexually Active: Not on file   Other Topics Concern  . Not on file   Social History Narrative   Son lives with him [place] the farm    Review of Systems  Constitutional: Positive for fatigue. Negative for unexpected weight change.       Weight is up 6# from last visit Drinks cranberry juice to try to prevent bladder infections  HENT: Negative for hearing loss, dental problem and tinnitus.        Hearing better since cerumen cleaned  Respiratory: Negative for chest tightness and shortness of breath.   Cardiovascular: Positive for leg swelling. Negative for chest pain and palpitations.  Gastrointestinal: Negative for constipation and blood in stool.  Genitourinary: Positive for frequency. Negative for dysuria and difficulty urinating.       Nocturia x 1 Some daytime frequency  Musculoskeletal: Negative for back pain and arthralgias.  Skin:       Had knot on right thumb---has gone away since  Neurological: Positive for weakness. Negative for dizziness, syncope, light-headedness, numbness and headaches.  Psychiatric/Behavioral: Negative for sleep disturbance and dysphoric mood. The patient is nervous/anxious.        Gets "upset with myself" trying to find out what is wrong       Objective:   Physical Exam  Constitutional: He is oriented to person, place, and time. He appears well-developed and well-nourished. No distress.  Eyes: Conjunctivae  and EOM are normal. Pupils are equal, round, and reactive to light.  Neck: Normal range of motion. Neck supple. No thyromegaly present.  Cardiovascular: Normal rate, regular rhythm, normal heart sounds and intact distal pulses.  Exam reveals no gallop.   No murmur heard. Pulmonary/Chest: Effort normal and breath sounds normal. No respiratory distress. He has no wheezes. He has no rales.  Musculoskeletal: Normal range of motion. He exhibits edema. He exhibits no tenderness.       Trace pedal and ankle edema  Lymphadenopathy:    He has no cervical adenopathy.  Neurological: He is alert and oriented to person, place, and time.       Louis Meckel,  [place]" [phone] Recall 3/3   Moderate bradykinesia Mild resting tremor Mild Parkinsonian gait Fairly normal tone  Skin: No rash noted.       Mycotic toenails but no ulcers  Psychiatric: He has a normal mood and affect. His behavior is normal. Judgment and thought content normal.          Assessment & Plan:

## 2010-08-29 NOTE — Assessment & Plan Note (Signed)
Mild Will check labs Needs to stop sea salt

## 2010-08-29 NOTE — Assessment & Plan Note (Signed)
Lab Results  Component Value Date   HGBA1C  Value: 6.1 (NOTE)                                                                       According to the ADA Clinical Practice Recommendations for 2011, when HbA1c is used as a screening test:   >=6.5%   Diagnostic of Diabetes Mellitus           (if abnormal result  is confirmed)  5.7-6.4%   Increased risk of developing Diabetes Mellitus  References:Diagnosis and Classification of Diabetes Mellitus,Diabetes Care,2011,34(Suppl 1):S62-S69 and Standards of Medical Care in         Diabetes - 2011,Diabetes Care,2011,34  (Suppl 1):S11-S61.* 10/27/2009   Doesn't check himself Will do labs again Check lipid also

## 2010-08-30 ENCOUNTER — Telehealth: Payer: Self-pay | Admitting: *Deleted

## 2010-08-30 NOTE — Telephone Encounter (Signed)
Rep from [facility] called, they are asking for chart notes to support hospital bed.  They have the copy of the office note but they need addendum with more supporting information, diagnosis codes.

## 2010-08-31 NOTE — Telephone Encounter (Signed)
What additional info do they need  Parkinsons is 332.0

## 2010-09-01 ENCOUNTER — Telehealth: Payer: Self-pay | Admitting: *Deleted

## 2010-09-01 NOTE — Telephone Encounter (Signed)
Left message to have Mitzi return my call.

## 2010-09-01 NOTE — Telephone Encounter (Signed)
I have addended the note from a few days ago Please send this to them to see if it works

## 2010-09-01 NOTE — Telephone Encounter (Signed)
Opened in error

## 2010-09-01 NOTE — Telephone Encounter (Signed)
Mitzi returned call. She says that the order needs to state that patient requires positioning of the body that it is not feasible with an ordinary bed and that he also requires frequent changes in body positions.

## 2010-09-05 ENCOUNTER — Telehealth: Payer: Self-pay | Admitting: *Deleted

## 2010-09-05 NOTE — Telephone Encounter (Signed)
Alan Peterson at [facility] says she received the 2nd office note that was to have included the addendum but there was no new information on it, she say she read line for line and everything was the same, except for the new copy had "other encounter related information" at the bottom, but nothing else.  Please advise.  Fax number is [phone].

## 2010-09-05 NOTE — Telephone Encounter (Signed)
We need to print the addendum and then fax that It sounds like the addendum didn't go

## 2010-09-15 NOTE — Telephone Encounter (Signed)
This has been taken care of per Carrie Efird. 

## 2011-02-27 ENCOUNTER — Ambulatory Visit (INDEPENDENT_AMBULATORY_CARE_PROVIDER_SITE_OTHER): Payer: Medicare Other | Admitting: Internal Medicine

## 2011-02-27 ENCOUNTER — Encounter: Payer: Self-pay | Admitting: Internal Medicine

## 2011-02-27 VITALS — BP 138/68 | HR 72 | Temp 97.7°F | Ht 70.0 in | Wt 210.0 lb

## 2011-02-27 DIAGNOSIS — G20 Parkinson's disease: Secondary | ICD-10-CM

## 2011-02-27 DIAGNOSIS — I10 Essential (primary) hypertension: Secondary | ICD-10-CM

## 2011-02-27 DIAGNOSIS — E119 Type 2 diabetes mellitus without complications: Secondary | ICD-10-CM

## 2011-02-27 DIAGNOSIS — E785 Hyperlipidemia, unspecified: Secondary | ICD-10-CM

## 2011-02-27 NOTE — Assessment & Plan Note (Signed)
Continues to have symptoms Never responded to meds Consider retry with sinemet if he worsens Using OTC product for now

## 2011-02-27 NOTE — Progress Notes (Signed)
  Subjective:    Patient ID: Alan Peterson, male    DOB: 16-Dec-1940, 70 y.o.   MRN: [id number]  HPI Here with son  Still gets soreness at night---wakes up stiff and sore Slow walking at first---has to really think about moving to the side No sig tremors Does loosen up after a while Still goes to work every day----gets fatigued by the late afternoon Taking OTC Cununa as tea---natural source of l-dopa. Feels it is helping some  Doesn't check sugars Tries to eat right  No chest pain No SOB Some AM congestion No edema recently---or at least much better  No current outpatient prescriptions on file prior to visit.    No Known Allergies  Past Medical History  Diagnosis Date  . Diabetes mellitus   . Hyperlipidemia   . Hypertension   . BPH (benign prostatic hypertrophy)   . Parkinson's disease     Past Surgical History  Procedure Date  . Finger contracture release 1980    right, left 4th finger release 8/10    Family History  Problem Relation Age of Onset  . Hypertension Mother   . Coronary artery disease Neg Hx   . Diabetes Neg Hx   . Cancer Neg Hx     History   Social History  . Marital Status: Divorced    Spouse Name: N/A    Number of Children: 2  . Years of Education: N/A   Occupational History  . 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 Smoking status: Never Smoker   . Smokeless tobacco: Never Used  . Alcohol Use: Yes     beer  . Drug Use: Not on file  . Sexually Active: Not on file   Other Topics Concern  . Not on file   Social History Narrative   Son lives with him on the farm   Review of Systems Sleeps okay at night---takes "power nap ~15 minutes" in afternoon Appetite is fine Weight is stable    Objective:   Physical Exam  Constitutional: He appears well-developed and well-nourished. No distress.  Neck: Normal range of motion. Neck supple.  Cardiovascular: Normal rate, regular rhythm  and normal heart sounds.  Exam reveals no gallop.   No murmur heard.      Faint pulse on right foot, 1+ on left  Pulmonary/Chest: Effort normal and breath sounds normal. No respiratory distress. He has no wheezes. He has no rales.  Musculoskeletal: He exhibits no edema.       Mycotic toenails without foot lesions  Lymphadenopathy:    He has no cervical adenopathy.  Neurological:       Walks with short steps but not shuffling Unable to get on table---got feet on step then froze Some drooling Mild increase tone  LE>UE  Skin: No rash noted.  Psychiatric: He has a normal mood and affect. His behavior is normal. Judgment and thought content normal.          Assessment & Plan:

## 2011-02-27 NOTE — Patient Instructions (Signed)
Please set up eye appointment

## 2011-02-27 NOTE — Assessment & Plan Note (Signed)
meds not appropriate given his muscle symptoms Lab Results  Component Value Date   LDLCALC 142* 01/30/2007

## 2011-02-27 NOTE — Assessment & Plan Note (Signed)
Has had good control No meds Will set up with [facility]

## 2011-02-27 NOTE — Assessment & Plan Note (Signed)
BP Readings from Last 3 Encounters:  02/27/11 138/68  08/29/10 158/80  02/23/10 158/80   Better today No meds

## 2011-03-03 ENCOUNTER — Ambulatory Visit: Payer: Medicare Other | Admitting: Internal Medicine

## 2011-09-01 ENCOUNTER — Encounter: Payer: Self-pay | Admitting: *Deleted

## 2011-09-01 ENCOUNTER — Encounter: Payer: Self-pay | Admitting: Internal Medicine

## 2011-09-01 ENCOUNTER — Ambulatory Visit (INDEPENDENT_AMBULATORY_CARE_PROVIDER_SITE_OTHER): Payer: Medicare Other | Admitting: Internal Medicine

## 2011-09-01 VITALS — BP 146/80 | HR 73 | Temp 97.7°F | Ht 70.0 in | Wt 198.0 lb

## 2011-09-01 DIAGNOSIS — I10 Essential (primary) hypertension: Secondary | ICD-10-CM

## 2011-09-01 DIAGNOSIS — E119 Type 2 diabetes mellitus without complications: Secondary | ICD-10-CM | POA: Diagnosis not present

## 2011-09-01 DIAGNOSIS — G20 Parkinson's disease: Secondary | ICD-10-CM

## 2011-09-01 DIAGNOSIS — E785 Hyperlipidemia, unspecified: Secondary | ICD-10-CM | POA: Diagnosis not present

## 2011-09-01 DIAGNOSIS — G20A1 Parkinson's disease without dyskinesia, without mention of fluctuations: Secondary | ICD-10-CM

## 2011-09-01 DIAGNOSIS — N400 Enlarged prostate without lower urinary tract symptoms: Secondary | ICD-10-CM

## 2011-09-01 LAB — CBC WITH DIFFERENTIAL/PLATELET
Basophils Absolute: 0 10*3/uL (ref 0.0–0.1)
Basophils Relative: 0.5 % (ref 0.0–3.0)
Eosinophils Absolute: 0 10*3/uL (ref 0.0–0.7)
HCT: 43.3 % (ref 39.0–52.0)
Hemoglobin: 14.6 g/dL (ref 13.0–17.0)
Lymphocytes Relative: 19.3 % (ref 12.0–46.0)
Lymphs Abs: 1.4 10*3/uL (ref 0.7–4.0)
MCHC: 33.7 g/dL (ref 30.0–36.0)
Monocytes Relative: 7.4 % (ref 3.0–12.0)
Neutro Abs: 5.1 10*3/uL (ref 1.4–7.7)
RBC: 4.64 Mil/uL (ref 4.22–5.81)
RDW: 13.1 % (ref 11.5–14.6)

## 2011-09-01 LAB — HEPATIC FUNCTION PANEL
ALT: 15 U/L (ref 0–53)
Albumin: 4.3 g/dL (ref 3.5–5.2)
Alkaline Phosphatase: 60 U/L (ref 39–117)
Bilirubin, Direct: 0 mg/dL (ref 0.0–0.3)
Total Protein: 7.2 g/dL (ref 6.0–8.3)

## 2011-09-01 LAB — MICROALBUMIN / CREATININE URINE RATIO: Microalb Creat Ratio: 1.5 mg/g (ref 0.0–30.0)

## 2011-09-01 LAB — LIPID PANEL
Cholesterol: 166 mg/dL (ref 0–200)
HDL: 46.1 mg/dL (ref >39.00)
Triglycerides: 58 mg/dL (ref 0.0–149.0)

## 2011-09-01 LAB — BASIC METABOLIC PANEL
CO2: 28 mEq/L (ref 19–32)
Glucose, Bld: 116 mg/dL — ABNORMAL HIGH (ref 70–99)
Potassium: 4.2 mEq/L (ref 3.5–5.1)
Sodium: 143 mEq/L (ref 135–145)

## 2011-09-01 LAB — HEMOGLOBIN A1C: Hgb A1c MFr Bld: 5.9 % (ref 4.6–6.5)

## 2011-09-01 NOTE — Assessment & Plan Note (Signed)
BP Readings from Last 3 Encounters:  09/01/11 146/80  02/27/11 138/68  08/29/10 158/80   Not quite at goal but with neuro syndrome, I am afraid of orthostasis with Rx Observe only

## 2011-09-01 NOTE — Assessment & Plan Note (Signed)
Doesn't check but control has been good Will check labs

## 2011-09-01 NOTE — Assessment & Plan Note (Signed)
Statin not appropriate with muscle issues

## 2011-09-01 NOTE — Progress Notes (Signed)
  Subjective:    Patient ID: Alan Peterson, male    DOB: 21-Nov-1940, 71 y.o.   MRN: [id number]  HPI Here with son Has gotten worse in the past 3-4 weeks Muscles are weaker, despite regular exercise (stands on vibrator, stretches, weight, walking) Harder even getting out of chair---muscles in extremities are weaker Hasn't seen Dr Hyacinth Meeker --neurologist --in over a year  Not checking sugars Eats healthy  No chest pain  No breathing problems Some ongoing swelling in ankles--mostly by the end of the day  No current outpatient prescriptions on file prior to visit.    No Known Allergies  Past Medical History  Diagnosis Date  . Diabetes mellitus   . Hyperlipidemia   . Hypertension   . BPH (benign prostatic hypertrophy)   . Parkinson's disease     Past Surgical History  Procedure Date  . Finger contracture release 1980    right, left 4th finger release 8/10    Family History  Problem Relation Age of Onset  . Hypertension Mother   . Coronary artery disease Neg Hx   . Diabetes Neg Hx   . Cancer Neg Hx     History   Social History  . Marital Status: Divorced    Spouse Name: N/A    Number of Children: 2  . Years of Education: N/A   Occupational History  . 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 Smoking status: Never Smoker   . Smokeless tobacco: Never Used  . Alcohol Use: Yes     beer  . Drug Use: Not on file  . Sexually Active: Not on file   Other Topics Concern  . Not on file   Social History Narrative   Son lives with him on the farm   Review of Systems Has lost some more weight Sleeps well Up once to void. Some daytime urgency    Objective:   Physical Exam  Constitutional: He appears well-developed. No distress.  Neck: Normal range of motion.  Cardiovascular: Normal rate, regular rhythm and normal heart sounds.  Exam reveals no gallop.   No murmur heard.      Pedal pulses not palpable    Pulmonary/Chest: Effort normal and breath sounds normal. No respiratory distress. He has no wheezes. He has no rales.  Musculoskeletal: He exhibits edema.       1+ pedal edema  Lymphadenopathy:    He has no cervical adenopathy.  Neurological:       Bradykinesia Mildly increased tone Generalized weakness drooling  Psychiatric: He has a normal mood and affect. His behavior is normal.       frustrated          Assessment & Plan:

## 2011-09-01 NOTE — Assessment & Plan Note (Signed)
Mild symptoms No Rx 

## 2011-09-01 NOTE — Assessment & Plan Note (Signed)
Has clear Parkinson's features but weakness is new Needs to go back to neurologist

## 2011-09-11 DIAGNOSIS — IMO0001 Reserved for inherently not codable concepts without codable children: Secondary | ICD-10-CM | POA: Diagnosis not present

## 2011-09-11 DIAGNOSIS — R5381 Other malaise: Secondary | ICD-10-CM | POA: Diagnosis not present

## 2011-09-11 DIAGNOSIS — G219 Secondary parkinsonism, unspecified: Secondary | ICD-10-CM | POA: Diagnosis not present

## 2011-09-11 DIAGNOSIS — G20 Parkinson's disease: Secondary | ICD-10-CM | POA: Diagnosis not present

## 2011-10-02 DIAGNOSIS — G608 Other hereditary and idiopathic neuropathies: Secondary | ICD-10-CM | POA: Diagnosis not present

## 2011-10-10 DIAGNOSIS — R262 Difficulty in walking, not elsewhere classified: Secondary | ICD-10-CM | POA: Diagnosis not present

## 2011-10-10 DIAGNOSIS — R5381 Other malaise: Secondary | ICD-10-CM | POA: Diagnosis not present

## 2011-10-10 DIAGNOSIS — G219 Secondary parkinsonism, unspecified: Secondary | ICD-10-CM | POA: Diagnosis not present

## 2011-10-10 DIAGNOSIS — IMO0001 Reserved for inherently not codable concepts without codable children: Secondary | ICD-10-CM | POA: Diagnosis not present

## 2011-10-10 DIAGNOSIS — G20 Parkinson's disease: Secondary | ICD-10-CM | POA: Diagnosis not present

## 2011-10-10 DIAGNOSIS — G609 Hereditary and idiopathic neuropathy, unspecified: Secondary | ICD-10-CM | POA: Diagnosis not present

## 2011-10-23 DIAGNOSIS — G609 Hereditary and idiopathic neuropathy, unspecified: Secondary | ICD-10-CM | POA: Diagnosis not present

## 2011-10-23 DIAGNOSIS — R262 Difficulty in walking, not elsewhere classified: Secondary | ICD-10-CM | POA: Diagnosis not present

## 2011-10-23 DIAGNOSIS — R5383 Other fatigue: Secondary | ICD-10-CM | POA: Diagnosis not present

## 2011-10-23 DIAGNOSIS — G20 Parkinson's disease: Secondary | ICD-10-CM | POA: Diagnosis not present

## 2011-11-30 DIAGNOSIS — R5381 Other malaise: Secondary | ICD-10-CM | POA: Diagnosis not present

## 2011-11-30 DIAGNOSIS — R262 Difficulty in walking, not elsewhere classified: Secondary | ICD-10-CM | POA: Diagnosis not present

## 2011-11-30 DIAGNOSIS — G219 Secondary parkinsonism, unspecified: Secondary | ICD-10-CM | POA: Diagnosis not present

## 2011-11-30 DIAGNOSIS — G20 Parkinson's disease: Secondary | ICD-10-CM | POA: Diagnosis not present

## 2011-11-30 DIAGNOSIS — R5383 Other fatigue: Secondary | ICD-10-CM | POA: Diagnosis not present

## 2012-01-24 DIAGNOSIS — G219 Secondary parkinsonism, unspecified: Secondary | ICD-10-CM | POA: Diagnosis not present

## 2012-01-24 DIAGNOSIS — R5383 Other fatigue: Secondary | ICD-10-CM | POA: Diagnosis not present

## 2012-01-24 DIAGNOSIS — R262 Difficulty in walking, not elsewhere classified: Secondary | ICD-10-CM | POA: Diagnosis not present

## 2012-01-24 DIAGNOSIS — R5381 Other malaise: Secondary | ICD-10-CM | POA: Diagnosis not present

## 2012-01-24 DIAGNOSIS — G20 Parkinson's disease: Secondary | ICD-10-CM | POA: Diagnosis not present

## 2012-04-02 ENCOUNTER — Encounter: Payer: Medicare Other | Admitting: Internal Medicine

## 2012-04-09 ENCOUNTER — Encounter: Payer: Self-pay | Admitting: Internal Medicine

## 2012-04-09 ENCOUNTER — Ambulatory Visit (INDEPENDENT_AMBULATORY_CARE_PROVIDER_SITE_OTHER): Payer: Medicare Other | Admitting: Internal Medicine

## 2012-04-09 VITALS — BP 138/82 | HR 66 | Temp 97.5°F | Ht 70.0 in | Wt 194.0 lb

## 2012-04-09 DIAGNOSIS — G20 Parkinson's disease: Secondary | ICD-10-CM | POA: Diagnosis not present

## 2012-04-09 DIAGNOSIS — I10 Essential (primary) hypertension: Secondary | ICD-10-CM | POA: Diagnosis not present

## 2012-04-09 DIAGNOSIS — Z1331 Encounter for screening for depression: Secondary | ICD-10-CM | POA: Diagnosis not present

## 2012-04-09 DIAGNOSIS — E785 Hyperlipidemia, unspecified: Secondary | ICD-10-CM | POA: Diagnosis not present

## 2012-04-09 DIAGNOSIS — Z0000 Encounter for general adult medical examination without abnormal findings: Secondary | ICD-10-CM | POA: Diagnosis not present

## 2012-04-09 DIAGNOSIS — E119 Type 2 diabetes mellitus without complications: Secondary | ICD-10-CM | POA: Diagnosis not present

## 2012-04-09 DIAGNOSIS — N400 Enlarged prostate without lower urinary tract symptoms: Secondary | ICD-10-CM

## 2012-04-09 DIAGNOSIS — G20C Parkinsonism, unspecified: Secondary | ICD-10-CM

## 2012-04-09 DIAGNOSIS — Z1211 Encounter for screening for malignant neoplasm of colon: Secondary | ICD-10-CM

## 2012-04-09 DIAGNOSIS — G20A1 Parkinson's disease without dyskinesia, without mention of fluctuations: Secondary | ICD-10-CM

## 2012-04-09 DIAGNOSIS — R609 Edema, unspecified: Secondary | ICD-10-CM

## 2012-04-09 LAB — BASIC METABOLIC PANEL
GFR: 95.02 mL/min (ref >60.00)
Potassium: 3.7 mEq/L (ref 3.5–5.1)
Sodium: 143 mEq/L (ref 135–145)

## 2012-04-09 LAB — CBC WITH DIFFERENTIAL/PLATELET
Eosinophils Relative: 0.5 % (ref 0.0–5.0)
HCT: 44.8 % (ref 39.0–52.0)
Hemoglobin: 15.3 g/dL (ref 13.0–17.0)
Lymphocytes Relative: 21.7 % (ref 12.0–46.0)
Lymphs Abs: 1.4 10*3/uL (ref 0.7–4.0)
Monocytes Relative: 7.7 % (ref 3.0–12.0)
Neutro Abs: 4.5 10*3/uL (ref 1.4–7.7)
RBC: 4.92 Mil/uL (ref 4.22–5.81)
WBC: 6.4 10*3/uL (ref 4.5–10.5)

## 2012-04-09 LAB — HEPATIC FUNCTION PANEL
AST: 17 U/L (ref 0–37)
Albumin: 4.4 g/dL (ref 3.5–5.2)
Alkaline Phosphatase: 56 U/L (ref 39–117)
Bilirubin, Direct: 0.1 mg/dL (ref 0.0–0.3)
Total Protein: 7.6 g/dL (ref 6.0–8.3)

## 2012-04-09 LAB — HEMOGLOBIN A1C: Hgb A1c MFr Bld: 5.8 % (ref 4.6–6.5)

## 2012-04-09 LAB — TSH: TSH: 1.06 u[IU]/mL (ref 0.35–5.50)

## 2012-04-09 NOTE — Assessment & Plan Note (Signed)
Voids okay No meds needed 

## 2012-04-09 NOTE — Assessment & Plan Note (Signed)
Venous insufficiency Discussed elevation

## 2012-04-09 NOTE — Assessment & Plan Note (Signed)
BP Readings from Last 3 Encounters:  04/09/12 138/82  09/01/11 146/80  02/27/11 138/68   124/70 at Dr Hyacinth Meeker No microalbuminuria No Rx needed

## 2012-04-09 NOTE — Assessment & Plan Note (Signed)
Lab Results  Component Value Date   LDLCALC 108* 09/01/2011   Given his Parkinson's, he doesn't want statin Close to goal without though

## 2012-04-09 NOTE — Assessment & Plan Note (Signed)
Didn't see improvement with neupro Is off again now Has follow up with Dr Hyacinth Meeker coming up

## 2012-04-09 NOTE — Assessment & Plan Note (Signed)
I have personally reviewed the Medicare Annual Wellness questionnaire and have noted 1. The patient's medical and social history 2. Their use of alcohol, tobacco or illicit drugs 3. Their current medications and supplements 4. The patient's functional ability including ADL's, fall risks, home safety risks and hearing or visual             impairment. 5. Diet and physical activities 6. Evidence for depression or mood disorders  The patients weight, height, BMI and visual acuity have been recorded in the chart I have made referrals, counseling and provided education to the patient based review of the above and I have provided the pt with a written personalized care plan for preventive services.  I have provided you with a copy of your personalized plan for preventive services. Please take the time to review along with your updated medication list.  He prefers no immunizations Will do stool immunoassay No PSA due to age--discussed

## 2012-04-09 NOTE — Progress Notes (Signed)
Subjective:    Patient ID: Alan Peterson, male    DOB: 1940-06-15, 72 y.o.   MRN: [id number]  HPI Here with son Wellness visit and follow up Vision and hearing okay Keeps up with Dr Oren Bracket, also sees neurologist No falls Occasional down time---no persistent depression. 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 rotigotine (NEUPRO) 4 MG/24HR Place 1 patch onto the skin daily.        No Known Allergies  Past Medical History  Diagnosis Date  . Diabetes mellitus   . Hyperlipidemia   . Hypertension   . BPH (benign prostatic hypertrophy)   . Parkinson's disease     Past Surgical History  Procedure Date  . Finger contracture release 1980    right, left 4th finger release 8/10    Family History  Problem Relation Age of Onset  . Hypertension Mother   . Coronary artery disease Neg Hx   . Diabetes Neg Hx   . Cancer Neg Hx     History   Social History  . Marital Status: Divorced    Spouse Name: N/A    Number of Children: 2  . Years of Education: N/A   Occupational History  . 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 Smoking status: Never Smoker   . Smokeless tobacco: Never Used  . Alcohol Use: Yes     Comment: beer  . Drug Use: Not on file  . Sexually Active: Not on file   Other Topics Concern  . Not on  file   Social History Narrative   Son lives with him on the farmHas a living will "somewhere"Requests son Alan Peterson to make decisions for himWould accept resuscitation attemptsProbably would want tube feeds   Review of Systems Appetite is good Weight 4# though Sleeps okay Bowels are fine Some puffiness of feet in the past few weeks---better in the morning. Not painful Easy bruising     Objective:   Physical Exam  Constitutional: He is oriented to person, place, and time. He appears well-developed and well-nourished. No distress.  Neck: Normal range of motion. Neck supple. No thyromegaly present.  Cardiovascular: Normal rate, normal heart sounds and intact distal pulses.  Exam reveals no gallop.   No murmur heard.      Regular with frequent extra beats  Pulmonary/Chest: Effort normal and breath sounds normal. No respiratory distress. He has no wheezes. He has no rales.  Abdominal: Soft. There is no tenderness.  Musculoskeletal: He exhibits edema.       1-2+ pitting edema in ankles  Lymphadenopathy:    He has no cervical adenopathy.  Neurological: He is alert and oriented to person, place, and time.       President-- "Obama, ?" [id number] Recall 3/3 Later "Bush"  Slow gait Unable to get on table Stiff Somewhat masked facies  Skin: No  erythema.       Slight stasis changes in calves  Psychiatric: He has a normal mood and affect. His behavior is normal.          Assessment & Plan:

## 2012-04-09 NOTE — Assessment & Plan Note (Signed)
Doesn't check Will do labs today 

## 2012-04-10 NOTE — Progress Notes (Signed)
  Subjective:    Patient ID: Alan Peterson, male    DOB: 10-03-1940, 72 y.o.   MRN: [id number]  HPI  Current Outpatient Prescriptions on File Prior to Visit  Medication Sig Dispense Refill  . rotigotine (NEUPRO) 4 MG/24HR Place 1 patch onto the skin daily.        No Known Allergies  Past Medical History  Diagnosis Date  . Diabetes mellitus   . Hyperlipidemia   . Hypertension   . BPH (benign prostatic hypertrophy)   . Parkinson's disease     Past Surgical History  Procedure Date  . Finger contracture release 1980    right, left 4th finger release 8/10    Family History  Problem Relation Age of Onset  . Hypertension Mother   . Coronary artery disease Neg Hx   . Diabetes Neg Hx   . Cancer Neg Hx     History   Social History  . Marital Status: Divorced    Spouse Name: N/A    Number of Children: 2  . Years of Education: N/A   Occupational History  . 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 Smoking status: Never Smoker   . Smokeless tobacco: Never Used  . Alcohol Use: Yes     Comment: beer  . Drug Use: Not on file  . Sexually Active: Not on file   Other Topics Concern  . Not on file   Social History Narrative   Son lives with him on the farmHas a living will "somewhere"Requests son Alan Peterson to make decisions for himWould accept resuscitation attemptsProbably would want tube feeds     Review of Systems     Objective:   Physical Exam        Assessment & Plan:

## 2012-04-12 ENCOUNTER — Encounter: Payer: Self-pay | Admitting: *Deleted

## 2012-05-03 DIAGNOSIS — G20 Parkinson's disease: Secondary | ICD-10-CM | POA: Diagnosis not present

## 2012-05-03 DIAGNOSIS — R5381 Other malaise: Secondary | ICD-10-CM | POA: Diagnosis not present

## 2012-05-03 DIAGNOSIS — R262 Difficulty in walking, not elsewhere classified: Secondary | ICD-10-CM | POA: Diagnosis not present

## 2012-05-03 DIAGNOSIS — R5383 Other fatigue: Secondary | ICD-10-CM | POA: Diagnosis not present

## 2012-05-03 DIAGNOSIS — G219 Secondary parkinsonism, unspecified: Secondary | ICD-10-CM | POA: Diagnosis not present

## 2012-07-25 DIAGNOSIS — R262 Difficulty in walking, not elsewhere classified: Secondary | ICD-10-CM | POA: Diagnosis not present

## 2012-07-25 DIAGNOSIS — R5383 Other fatigue: Secondary | ICD-10-CM | POA: Diagnosis not present

## 2012-07-25 DIAGNOSIS — G219 Secondary parkinsonism, unspecified: Secondary | ICD-10-CM | POA: Diagnosis not present

## 2012-07-25 DIAGNOSIS — G20 Parkinson's disease: Secondary | ICD-10-CM | POA: Diagnosis not present

## 2012-10-08 ENCOUNTER — Ambulatory Visit (INDEPENDENT_AMBULATORY_CARE_PROVIDER_SITE_OTHER): Payer: Medicare Other | Admitting: Internal Medicine

## 2012-10-08 ENCOUNTER — Encounter: Payer: Self-pay | Admitting: Internal Medicine

## 2012-10-08 VITALS — BP 146/70 | HR 66 | Temp 98.0°F | Wt 197.0 lb

## 2012-10-08 DIAGNOSIS — G20 Parkinson's disease: Secondary | ICD-10-CM | POA: Diagnosis not present

## 2012-10-08 DIAGNOSIS — E119 Type 2 diabetes mellitus without complications: Secondary | ICD-10-CM | POA: Diagnosis not present

## 2012-10-08 DIAGNOSIS — I10 Essential (primary) hypertension: Secondary | ICD-10-CM | POA: Diagnosis not present

## 2012-10-08 DIAGNOSIS — G20A1 Parkinson's disease without dyskinesia, without mention of fluctuations: Secondary | ICD-10-CM

## 2012-10-08 LAB — HEMOGLOBIN A1C: Hgb A1c MFr Bld: 5.8 % (ref 4.6–6.5)

## 2012-10-08 NOTE — Assessment & Plan Note (Signed)
Has been slack with diet Hopefully still good control

## 2012-10-08 NOTE — Assessment & Plan Note (Signed)
Doing some better on the sinemet Continues with Dr Hyacinth Meeker

## 2012-10-08 NOTE — Assessment & Plan Note (Signed)
BP Readings from Last 3 Encounters:  10/08/12 146/70  04/09/12 138/82  09/01/11 146/80   I am concerned about orthostasis with Rx since he has Parkinsons Will only Rx (probably ARB) if microal positive

## 2012-10-08 NOTE — Progress Notes (Signed)
  Subjective:    Patient ID: Alan Peterson, male    DOB: [birth year], 72 y.o.   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 rotigotine (NEUPRO) 4 MG/24HR Place 1 patch onto the skin daily.       No current facility-administered medications on file prior to visit.    No Known Allergies  Past Medical History  Diagnosis Date  . Diabetes mellitus   . Hyperlipidemia   . Hypertension   . BPH (benign prostatic hypertrophy)   . Parkinson's disease     Past Surgical History  Procedure Laterality Date  . Finger contracture release  1980    right, left 4th finger release 8/10    Family History  Problem Relation Age of Onset  . Hypertension Mother   . Coronary artery disease Neg Hx   . Diabetes Neg Hx   . Cancer Neg Hx     History   Social History  . Marital Status: Divorced    Spouse Name: N/A    Number of Children: 2  . Years of Education: N/A   Occupational History  . 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 Smoking status: Never Smoker   . Smokeless tobacco: Never Used  . Alcohol Use: Yes     Comment: beer  . Drug Use: Not on file  . Sexually Active: Not on file   Other Topics Concern  . Not on file   Social History Narrative   [place] the farm      Has a living will "somewhere"   Requests son Alan Ruiz to make decisions for him   Would accept resuscitation attempts   Probably would want tube feeds   Review of Systems Feels his urine is darker on the  neupro Sleeps great Weight is stable    Objective:   Physical Exam  Constitutional: He appears well-developed and well-nourished. No distress.  Neck: Normal range of motion.  Cardiovascular: Normal rate, regular rhythm, normal heart sounds and intact distal pulses.  Exam reveals no gallop and no friction rub.   No murmur heard. Frequent extra beats  Pulmonary/Chest: Effort normal and breath sounds normal. No respiratory distress. He has no wheezes. He has no rales.  Musculoskeletal: He exhibits edema.  1+ ankle and foot edema  Lymphadenopathy:    He has no cervical adenopathy.  Neurological:  Some stiffness and bradycardia  Psychiatric: He has a normal mood and affect. His behavior is normal.          Assessment & Plan:

## 2012-10-09 ENCOUNTER — Encounter: Payer: Self-pay | Admitting: *Deleted

## 2012-12-11 DIAGNOSIS — G20 Parkinson's disease: Secondary | ICD-10-CM | POA: Diagnosis not present

## 2012-12-11 DIAGNOSIS — R5381 Other malaise: Secondary | ICD-10-CM | POA: Diagnosis not present

## 2012-12-11 DIAGNOSIS — R262 Difficulty in walking, not elsewhere classified: Secondary | ICD-10-CM | POA: Diagnosis not present

## 2012-12-11 DIAGNOSIS — G219 Secondary parkinsonism, unspecified: Secondary | ICD-10-CM | POA: Diagnosis not present

## 2013-02-25 ENCOUNTER — Encounter: Payer: Self-pay | Admitting: Internal Medicine

## 2013-02-25 ENCOUNTER — Ambulatory Visit (INDEPENDENT_AMBULATORY_CARE_PROVIDER_SITE_OTHER): Payer: Medicare Other | Admitting: Internal Medicine

## 2013-02-25 VITALS — BP 144/80 | HR 58 | Temp 96.7°F | Wt 200.0 lb

## 2013-02-25 DIAGNOSIS — L97309 Non-pressure chronic ulcer of unspecified ankle with unspecified severity: Secondary | ICD-10-CM

## 2013-02-25 DIAGNOSIS — I83013 Varicose veins of right lower extremity with ulcer of ankle: Secondary | ICD-10-CM

## 2013-02-25 DIAGNOSIS — I83003 Varicose veins of unspecified lower extremity with ulcer of ankle: Secondary | ICD-10-CM | POA: Insufficient documentation

## 2013-02-25 MED ORDER — FUROSEMIDE 40 MG PO TABS: 40.0000 mg | ORAL_TABLET | Freq: Every day | ORAL | Status: DC | PRN

## 2013-02-25 NOTE — Progress Notes (Signed)
   Subjective:    Patient ID: Alan Peterson, male    DOB: 03/24/1940, 72 y.o.   MRN: [id number]  HPI Has ulcer on his medial right ankle for a week Just blistered and thin skin busted  Has stable edema---doesn't think that is worse  Using tincture of iodine--may have helped (has dried it up) Mild pain  Current Outpatient Prescriptions on File Prior to Visit  Medication Sig Dispense Refill  . carbidopa-levodopa (SINEMET IR) 25-100 MG per tablet Take 1 tablet by mouth 2 (two) times daily.       No current facility-administered medications on file prior to visit.    No Known Allergies  Past Medical History  Diagnosis Date  . Diabetes mellitus   . Hyperlipidemia   . Hypertension   . BPH (benign prostatic hypertrophy)   . Parkinson's disease     Past Surgical History  Procedure Laterality Date  . Finger contracture release  1980    right, left 4th finger release 8/10    Family History  Problem Relation Age of Onset  . Hypertension Mother   . Coronary artery disease Neg Hx   . Diabetes Neg Hx   . Cancer Neg Hx     History   Social History  . Marital Status: Divorced    Spouse Name: N/A    Number of Children: 2  . Years of Education: N/A   Occupational History  . 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 Smoking status: Never Smoker   . Smokeless tobacco: Never Used  . Alcohol Use: Yes     Comment: beer  . Drug Use: Not on file  . Sexual Activity: Not on file   Other Topics Concern  . Not on file   Social History Narrative   Son lives with him on the farm      Has a living will "somewhere"   Requests son [name] to make decisions for him   Would accept resuscitation attempts   Probably would want tube feeds   Review of Systems No fever Doesn't feel sick Appetite is great     Objective:   Physical Exam  Constitutional: He appears well-developed. No distress.  Cardiovascular:  Very faint pulse in  right foot  Musculoskeletal: He exhibits edema.  2+ edema in both calves/feet  Skin:  ~2.5cm circular ulcer on medial malleolus of right ankle Borders have dry eschar Not infected  Venous stasis changes in calves but no heat (mostly reddish)          Assessment & Plan:

## 2013-02-25 NOTE — Patient Instructions (Addendum)
Please clean the wound with soap and water. Cover daily with antibiotic ointment and a non stick dressing Please take the furosemide tab every day that you have swelling in your feet

## 2013-02-25 NOTE — Assessment & Plan Note (Signed)
Clearly has venous insufficiency and edema There is a small chance he has arterial insufficiency as well Not infected Will try antibiotic ointment and telfa---dressed here Start prn furosemide to remove fluid If not healing, will check ABI

## 2013-02-25 NOTE — Progress Notes (Signed)
Pre-visit discussion using our clinic review tool. No additional management support is needed unless otherwise documented below in the visit note.  

## 2013-03-05 ENCOUNTER — Encounter: Payer: Self-pay | Admitting: Internal Medicine

## 2013-03-05 ENCOUNTER — Ambulatory Visit (INDEPENDENT_AMBULATORY_CARE_PROVIDER_SITE_OTHER): Payer: Medicare Other | Admitting: Internal Medicine

## 2013-03-05 VITALS — BP 134/88 | HR 63 | Temp 97.4°F | Wt 196.8 lb

## 2013-03-05 DIAGNOSIS — I872 Venous insufficiency (chronic) (peripheral): Secondary | ICD-10-CM | POA: Diagnosis not present

## 2013-03-05 DIAGNOSIS — L97309 Non-pressure chronic ulcer of unspecified ankle with unspecified severity: Secondary | ICD-10-CM | POA: Diagnosis not present

## 2013-03-05 DIAGNOSIS — I83003 Varicose veins of unspecified lower extremity with ulcer of ankle: Secondary | ICD-10-CM

## 2013-03-05 DIAGNOSIS — E119 Type 2 diabetes mellitus without complications: Secondary | ICD-10-CM | POA: Diagnosis not present

## 2013-03-05 LAB — LIPID PANEL
Cholesterol: 168 mg/dL (ref 0–200)
HDL: 42.7 mg/dL (ref >39.00)
VLDL: 17.4 mg/dL (ref 0.0–40.0)

## 2013-03-05 LAB — BASIC METABOLIC PANEL
BUN: 25 mg/dL — ABNORMAL HIGH (ref 6–23)
CO2: 30 mEq/L (ref 19–32)
Chloride: 106 mEq/L (ref 96–112)
Creatinine, Ser: 0.9 mg/dL (ref 0.4–1.5)
Glucose, Bld: 129 mg/dL — ABNORMAL HIGH (ref 70–99)
Sodium: 142 mEq/L (ref 135–145)

## 2013-03-05 NOTE — Assessment & Plan Note (Signed)
I urged him to continue the furosemide most days---does limit him since he voids frequently for a few hours

## 2013-03-05 NOTE — Assessment & Plan Note (Signed)
Resolved No care needed ---just avoid trauma

## 2013-03-05 NOTE — Patient Instructions (Signed)
Please continue to prop your feet up when you are sitting. Take the furosemide most days---this will keep the swelling down and reduce the risk of another ulcer. We will call if you need to take potassium with the fluid pill.

## 2013-03-05 NOTE — Progress Notes (Signed)
   Subjective:    Patient ID: Alan Peterson, male    DOB: May 31, 1940, 72 y.o.   MRN: [id number]  HPI Ulcer is better Almost healed up  Used the furosemide daily till skipping yesterday Has really diuresed when he has taken it  Has been leaving it open---just home and walks around without shoes  Current Outpatient Prescriptions on File Prior to Visit  Medication Sig Dispense Refill  . carbidopa-levodopa (SINEMET IR) 25-100 MG per tablet Take 1 tablet by mouth 2 (two) times daily.      . furosemide (LASIX) 40 MG tablet Take 1 tablet (40 mg total) by mouth daily as needed.  30 tablet  3  . rotigotine (NEUPRO) 2 MG/24HR Place 1 patch onto the skin daily.       No current facility-administered medications on file prior to visit.    No Known Allergies  Past Medical History  Diagnosis Date  . Diabetes mellitus   . Hyperlipidemia   . Hypertension   . BPH (benign prostatic hypertrophy)   . Parkinson's disease     Past Surgical History  Procedure Laterality Date  . Finger contracture release  1980    right, left 4th finger release 8/10    Family History  Problem Relation Age of Onset  . Hypertension Mother   . Coronary artery disease Neg Hx   . Diabetes Neg Hx   . Cancer Neg Hx     History   Social History  . Marital Status: Divorced    Spouse Name: N/A    Number of Children: 2  . Years of Education: N/A   Occupational History  . 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 Smoking status: Never Smoker   . Smokeless tobacco: Never Used  . Alcohol Use: Yes     Comment: beer  . Drug Use: Not on file  . Sexual Activity: Not on file   Other Topics Concern  . Not on file   Social History Narrative   Son lives with him on the farm      Has a living will "somewhere"   Requests son [name] to make decisions for him   Would accept resuscitation attempts   Probably would want tube feeds   Review of Systems No  fever No pain in legs other than some leg cramps    Objective:   Physical Exam  Constitutional: He appears well-developed. No distress.  Cardiovascular:  Very faint dorsalis pedis pulse on right  Musculoskeletal: He exhibits edema.  1+ ankle edema still  Skin:  Medial left ankle ulcer has fully granulated No inflammation          Assessment & Plan:

## 2013-03-05 NOTE — Assessment & Plan Note (Signed)
Remain fine Will recheck

## 2013-03-05 NOTE — Progress Notes (Signed)
Pre-visit discussion using our clinic review tool. No additional management support is needed unless otherwise documented below in the visit note.  

## 2013-03-07 LAB — HEMOGLOBIN A1C: Hgb A1c MFr Bld: 5.7 % (ref 4.6–6.5)

## 2013-03-10 ENCOUNTER — Encounter: Payer: Self-pay | Admitting: *Deleted

## 2013-04-23 ENCOUNTER — Encounter: Payer: Self-pay | Admitting: Internal Medicine

## 2013-04-23 ENCOUNTER — Ambulatory Visit (INDEPENDENT_AMBULATORY_CARE_PROVIDER_SITE_OTHER): Payer: Medicare Other | Admitting: Internal Medicine

## 2013-04-23 VITALS — BP 158/80 | HR 68 | Temp 97.8°F | Ht 70.0 in | Wt 201.0 lb

## 2013-04-23 DIAGNOSIS — E1149 Type 2 diabetes mellitus with other diabetic neurological complication: Secondary | ICD-10-CM

## 2013-04-23 DIAGNOSIS — Z0000 Encounter for general adult medical examination without abnormal findings: Secondary | ICD-10-CM | POA: Diagnosis not present

## 2013-04-23 DIAGNOSIS — I872 Venous insufficiency (chronic) (peripheral): Secondary | ICD-10-CM | POA: Diagnosis not present

## 2013-04-23 DIAGNOSIS — G20 Parkinson's disease: Secondary | ICD-10-CM | POA: Diagnosis not present

## 2013-04-23 DIAGNOSIS — N400 Enlarged prostate without lower urinary tract symptoms: Secondary | ICD-10-CM

## 2013-04-23 DIAGNOSIS — I10 Essential (primary) hypertension: Secondary | ICD-10-CM

## 2013-04-23 LAB — HM DIABETES FOOT EXAM

## 2013-04-23 NOTE — Assessment & Plan Note (Signed)
Better Uses the furosemide rarely No ulcers now

## 2013-04-23 NOTE — Progress Notes (Signed)
Pre-visit discussion using our clinic review tool. No additional management support is needed unless otherwise documented below in the visit note.  

## 2013-04-23 NOTE — Assessment & Plan Note (Signed)
BP Readings from Last 3 Encounters:  04/23/13 158/80  03/05/13 134/88  02/25/13 144/80   Fine without meds usually I would not be aggressive given his Parkinsons

## 2013-04-23 NOTE — Assessment & Plan Note (Signed)
Lab Results  Component Value Date   HGBA1C 5.7 03/05/2013   Good control

## 2013-04-23 NOTE — Assessment & Plan Note (Signed)
Mild problems with flow and frequency No meds for now

## 2013-04-23 NOTE — Progress Notes (Signed)
Subjective:    Patient ID: Alan Peterson, male    DOB: 1940/09/06, 73 y.o.   MRN: [id number]  HPI Here for Medicare wellness visit and regular follow up No falls No depression or anhedonia No alcohol or tobacco Hearing and vision are okay. 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 Had aide to help with shower and shaving--tough with his Parkinson's Reviewed his form  Still sees Dr Hyacinth MeekerMiller for his Parkinson's Ran out of neupro--too much money No clear cut changes--will discuss with him Still has hesitation trying to start walking Does walk and do light weights  Diabetes control still fine Recent A1c still normal No meds No sores or pain in feet  Does have some swelling in legs still Generally better though Rarely uses the furosemide No ulcers now  Current Outpatient Prescriptions on File Prior to Visit  Medication Sig Dispense Refill  . carbidopa-levodopa (SINEMET IR) 25-100 MG per tablet Take 1 tablet by mouth 2 (two) times daily.      . furosemide (LASIX) 40 MG tablet Take 1 tablet (40 mg total) by mouth daily as needed.  30 tablet  3  . rotigotine (NEUPRO) 2 MG/24HR Place 1 patch onto the skin daily.       No current facility-administered medications on file prior to visit.    No Known Allergies  Past Medical History  Diagnosis Date  . Diabetes mellitus   . Hyperlipidemia   . Hypertension   . BPH (benign prostatic hypertrophy)   . Parkinson's disease     Past Surgical History  Procedure Laterality Date  . Finger contracture release  1980    right, left 4th finger release 8/10    Family History  Problem Relation Age of Onset  . Hypertension Mother   . Coronary artery disease Neg Hx   . Diabetes Neg Hx   . Cancer Neg Hx     History   Social History  . Marital Status: Divorced    Spouse Name: N/A    Number of  Children: 2  . Years of Education: N/A   Occupational History  . 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 Smoking status: Never Smoker   . Smokeless tobacco: Never Used  . Alcohol Use: Yes     Comment: beer  . Drug Use: Not on file  . Sexual Activity: Not on file   Other Topics Concern  . Not on file   Social History Narrative   Son lives with him on the farm      Has a living will "somewhere"   Requests son [name] to make decisions for him   Would accept resuscitation attempts   Probably would want tube feeds   Review of Systems Appetite is fine Weight is up 3# Sleeps well Bowels are fine-- no blood or changes    Objective:   Physical Exam  Constitutional: He is oriented to person, place, and time. He appears well-developed and well-nourished. No distress.  Neck: Normal range of motion. Neck supple. No thyromegaly present.  Cardiovascular: Normal rate, regular rhythm and normal heart sounds.  Exam reveals no gallop.   No murmur heard. Occasional skipped beats Faint pedal pulses  Pulmonary/Chest: Effort normal and breath sounds normal. No respiratory distress. He has no wheezes. He has no rales.  Abdominal: Soft. There is  no tenderness.  Musculoskeletal:  1+ calf/ankle edema   Lymphadenopathy:    He has no cervical adenopathy.  Neurological: He is alert and oriented to person, place, and time.  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

## 2013-04-23 NOTE — Assessment & Plan Note (Signed)
I have personally reviewed the Medicare Annual Wellness questionnaire and have noted 1. The patient's medical and social history 2. Their use of alcohol, tobacco or illicit drugs 3. Their current medications and supplements 4. The patient's functional ability including ADL's, fall risks, home safety risks and hearing or visual             impairment. 5. Diet and physical activities 6. Evidence for depression or mood disorders  The patients weight, height, BMI and visual acuity have been recorded in the chart I have made referrals, counseling and provided education to the patient based review of the above and I have provided the pt with a written personalized care plan for preventive services.  I have provided you with a copy of your personalized plan for preventive services. Please take the time to review along with your updated medication list.  Prefers no cancer screening now

## 2013-04-23 NOTE — Assessment & Plan Note (Addendum)
Ongoing issues No obvious progression Continues with Dr Hyacinth MeekerMiller  Concerned about drooling Could try glycopyrolate but I prefer to hold off on meds Asked him to discuss botox with Dr Hyacinth MeekerMiller

## 2013-04-23 NOTE — Patient Instructions (Addendum)
Please set up your regular eye examination  Diabetes Meal Planning Guide The diabetes meal planning guide is a tool to help you plan your meals and snacks. It is important for people with diabetes to manage their blood glucose (sugar) levels. Choosing the right foods and the right amounts throughout your day will help control your blood glucose. Eating right can even help you improve your blood pressure and reach or maintain a healthy weight. CARBOHYDRATE COUNTING MADE EASY When you eat carbohydrates, they turn to sugar. This raises your blood glucose level. Counting carbohydrates can help you control this level so you feel better. When you plan your meals by counting carbohydrates, you can have more flexibility in what you eat and balance your medicine with your food intake. Carbohydrate counting simply means adding up the total amount of carbohydrate grams in your meals and snacks. Try to eat about the same amount at each meal. Foods with carbohydrates are listed below. Each portion below is 1 carbohydrate serving or 15 grams of carbohydrates. Ask your dietician how many grams of carbohydrates you should eat at each meal or snack. Grains and Starches  1 slice bread.   English muffin or hotdog/hamburger bun.   cup cold cereal (unsweetened).   cup cooked pasta or rice.   cup starchy vegetables (corn, potatoes, peas, beans, winter squash).  1 tortilla (6 inches).   bagel.  1 waffle or pancake (size of a CD).   cup cooked cereal.  4 to 6 small crackers. *Whole grain is recommended. Fruit  1 cup fresh unsweetened berries, melon, papaya, pineapple.  1 small fresh fruit.   banana or mango.   cup fruit juice (4 oz unsweetened).   cup canned fruit in natural juice or water.  2 tbs dried fruit.  12 to 15 grapes or cherries. Milk and Yogurt  1 cup fat-free or 1% milk.  1 cup soy milk.  6 oz light yogurt with sugar-free sweetener.  6 oz low-fat soy yogurt.  6 oz  plain yogurt. Vegetables  1 cup raw or  cup cooked is counted as 0 carbohydrates or a "free" food.  If you eat 3 or more servings at 1 meal, count them as 1 carbohydrate serving. Other Carbohydrates   oz chips or pretzels.   cup ice cream or frozen yogurt.   cup sherbet or sorbet.  2 inch square cake, no frosting.  1 tbs honey, sugar, jam, jelly, or syrup.  2 small cookies.  3 squares of graham crackers.  3 cups popcorn.  6 crackers.  1 cup broth-based soup.  Count 1 cup casserole or other mixed foods as 2 carbohydrate servings.  Foods with less than 20 calories in a serving may be counted as 0 carbohydrates or a "free" food. You may want to purchase a book or computer software that lists the carbohydrate gram counts of different foods. In addition, the nutrition facts panel on the labels of the foods you eat are a good source of this information. The label will tell you how big the serving size is and the total number of carbohydrate grams you will be eating per serving. Divide this number by 15 to obtain the number of carbohydrate servings in a portion. Remember, 1 carbohydrate serving equals 15 grams of carbohydrate. SERVING SIZES Measuring foods and serving sizes helps you make sure you are getting the right amount of food. The list below tells how big or small some common serving sizes are.  1 oz.........4 stacked dice.  3 oz........Marland Kitchen.Deck of cards.  1 tsp.......Marland Kitchen.Tip of little finger.  1 tbs......Marland Kitchen.Marland Kitchen.Thumb.  2 tbs.......Marland Kitchen.Golf ball.   cup......Marland Kitchen.Half of a fist.  1 cup.......Marland Kitchen.A fist. SAMPLE DIABETES MEAL PLAN Below is a sample meal plan that includes foods from the grain and starches, dairy, vegetable, fruit, and meat groups. A dietician can individualize a meal plan to fit your calorie needs and tell you the number of servings needed from each food group. However, controlling the total amount of carbohydrates in your meal or snack is more important than making  sure you include all of the food groups at every meal. You may interchange carbohydrate containing foods (dairy, starches, and fruits). The meal plan below is an example of a 2000 calorie diet using carbohydrate counting. This meal plan has 17 carbohydrate servings. Breakfast  1 cup oatmeal (2 carb servings).   cup light yogurt (1 carb serving).  1 cup blueberries (1 carb serving).   cup almonds. Snack  1 large apple (2 carb servings).  1 low-fat string cheese stick. Lunch  Chicken breast salad.  1 cup spinach.   cup chopped tomatoes.  2 oz chicken breast, sliced.  2 tbs low-fat [place] dressing.  12 whole-wheat crackers (2 carb servings).  12 to 15 grapes (1 carb serving).  1 cup low-fat milk (1 carb serving). Snack  1 cup carrots.   cup hummus (1 carb serving). Dinner  3 oz broiled salmon.  1 cup brown rice (3 carb servings). Snack  1  cups steamed broccoli (1 carb serving) drizzled with 1 tsp olive oil and lemon juice.  1 cup light pudding (2 carb servings). DIABETES MEAL PLANNING WORKSHEET Your dietician can use this worksheet to help you decide how many servings of foods and what types of foods are right for you.  BREAKFAST Food Group and Servings / Carb Servings Grain/Starches __________________________________ Dairy __________________________________________ Vegetable ______________________________________ Fruit ___________________________________________ Meat __________________________________________ Fat ____________________________________________ LUNCH Food Group and Servings / Carb Servings Grain/Starches ___________________________________ Dairy ___________________________________________ Fruit ____________________________________________ Meat ___________________________________________ Fat _____________________________________________ Laural GoldenINNER Food Group and Servings / Carb Servings Grain/Starches  ___________________________________ Dairy ___________________________________________ Fruit ____________________________________________ Meat ___________________________________________ Fat _____________________________________________ SNACKS Food Group and Servings / Carb Servings Grain/Starches ___________________________________ Dairy ___________________________________________ Vegetable _______________________________________ Fruit ____________________________________________ Meat ___________________________________________ Fat _____________________________________________ DAILY TOTALS Starches _________________________ Vegetable ________________________ Fruit ____________________________ Dairy ____________________________ Meat ____________________________ Fat ______________________________ Document Released: 11/24/2004 Document Revised: 05/22/2011 Document Reviewed: 10/05/2008 [employer] Patient Information 2014 [employer]

## 2013-04-24 ENCOUNTER — Telehealth: Payer: Self-pay | Admitting: Internal Medicine

## 2013-04-24 NOTE — Telephone Encounter (Signed)
Relevant patient education assigned to patient using Emmi. ° °

## 2013-04-25 ENCOUNTER — Telehealth: Payer: Self-pay

## 2013-04-25 NOTE — Telephone Encounter (Signed)
Relevant patient education assigned to patient using Emmi. ° °

## 2013-06-11 DIAGNOSIS — R262 Difficulty in walking, not elsewhere classified: Secondary | ICD-10-CM | POA: Diagnosis not present

## 2013-06-11 DIAGNOSIS — R5381 Other malaise: Secondary | ICD-10-CM | POA: Diagnosis not present

## 2013-06-11 DIAGNOSIS — R5383 Other fatigue: Secondary | ICD-10-CM | POA: Diagnosis not present

## 2013-06-11 DIAGNOSIS — G20 Parkinson's disease: Secondary | ICD-10-CM | POA: Diagnosis not present

## 2013-06-11 DIAGNOSIS — G219 Secondary parkinsonism, unspecified: Secondary | ICD-10-CM | POA: Diagnosis not present

## 2013-08-05 ENCOUNTER — Telehealth: Payer: Self-pay | Admitting: Internal Medicine

## 2013-08-05 NOTE — Telephone Encounter (Signed)
Left message on VM that pt would need an appt in order to fill the form out and advised calling the office for an appt

## 2013-08-05 NOTE — Telephone Encounter (Signed)
Does patient need a OV? Form on your desk

## 2013-08-05 NOTE — Telephone Encounter (Signed)
Pt dropped off [employer] Form to be filled out by Dr Alphonsus Sias. Please call when form is ready. Form placed on Dee's desk to give to Dr Alphonsus Sias. Thank you!

## 2013-08-05 NOTE — Telephone Encounter (Signed)
Yes, he will need an appt to do that form

## 2013-08-18 ENCOUNTER — Ambulatory Visit (INDEPENDENT_AMBULATORY_CARE_PROVIDER_SITE_OTHER): Payer: Medicare Other | Admitting: Internal Medicine

## 2013-08-18 ENCOUNTER — Encounter: Payer: Self-pay | Admitting: Internal Medicine

## 2013-08-18 VITALS — BP 150/80 | HR 80 | Temp 98.0°F | Wt 191.0 lb

## 2013-08-18 DIAGNOSIS — G20 Parkinson's disease: Secondary | ICD-10-CM | POA: Diagnosis not present

## 2013-08-18 NOTE — Progress Notes (Signed)
Pre visit review using our clinic review tool, if applicable. No additional management support is needed unless otherwise documented below in the visit note. 

## 2013-08-18 NOTE — Progress Notes (Signed)
   Subjective:    Patient ID: Alan Peterson, male    DOB: 10/16/1940, 73 y.o.   MRN: [id number]  HPI Larey Seat on April 10th Had things in his hands, and reached back to [place] door. Latch didn't release and he fell backwards and hit tailbone Was able to break his fall some Has had to be careful sitting since then   Hasn't driven since then due to pain Has family, etc drive him around since then Still hopes to drive  Current Outpatient Prescriptions on File Prior to Visit  Medication Sig Dispense Refill  . carbidopa-levodopa (SINEMET IR) 25-100 MG per tablet Take 1 tablet by mouth 2 (two) times daily.      . rotigotine (NEUPRO) 2 MG/24HR Place 1 patch onto the skin daily.       No current facility-administered medications on file prior to visit.    No Known Allergies  Past Medical History  Diagnosis Date  . Diabetes mellitus   . Hyperlipidemia   . Hypertension   . BPH (benign prostatic hypertrophy)   . Parkinson's disease     Past Surgical History  Procedure Laterality Date  . Finger contracture release  1980    right, left 4th finger release 8/10    Family History  Problem Relation Age of Onset  . Hypertension Mother   . Coronary artery disease Neg Hx   . Diabetes Neg Hx   . Cancer Neg Hx     History   Social History  . Marital Status: Divorced    Spouse Name: N/A    Number of Children: 2  . Years of Education: N/A   Occupational History  . 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 Smoking status: Never Smoker   . Smokeless tobacco: Never Used  . Alcohol Use: Yes     Comment: beer  . Drug Use: Not on file  . Sexual Activity: Not on file   Other Topics Concern  . Not on file   Social History Narrative   Son lives with him [place] the farm      Has a living will "somewhere"   Requests son Alan Peterson to make decisions for him   Would accept resuscitation attempts   Probably would want tube feeds   Review of  Systems Sleeps okay--tends to sleep "too much". Thinks it is related to neupro Appetite is fine    Objective:   Physical Exam  Neurological:  Independent out of chair fairly quickly Walks with small steps but not shuffling--slightly bent over Some bradykinesia with hands          Assessment & Plan:

## 2013-08-18 NOTE — Assessment & Plan Note (Signed)
I suspect he is not safe enough to continue driving I discussed this with him  I will indicate that he needs to pass driving test before he should be allowed to drive (I suspect he won't)

## 2013-10-22 ENCOUNTER — Ambulatory Visit (INDEPENDENT_AMBULATORY_CARE_PROVIDER_SITE_OTHER): Payer: Medicare Other | Admitting: Internal Medicine

## 2013-10-22 ENCOUNTER — Encounter: Payer: Self-pay | Admitting: Internal Medicine

## 2013-10-22 VITALS — BP 128/70 | HR 75 | Temp 97.8°F | Wt 188.0 lb

## 2013-10-22 DIAGNOSIS — R0609 Other forms of dyspnea: Secondary | ICD-10-CM | POA: Diagnosis not present

## 2013-10-22 DIAGNOSIS — I10 Essential (primary) hypertension: Secondary | ICD-10-CM | POA: Diagnosis not present

## 2013-10-22 DIAGNOSIS — E1149 Type 2 diabetes mellitus with other diabetic neurological complication: Secondary | ICD-10-CM

## 2013-10-22 DIAGNOSIS — E785 Hyperlipidemia, unspecified: Secondary | ICD-10-CM

## 2013-10-22 DIAGNOSIS — R0989 Other specified symptoms and signs involving the circulatory and respiratory systems: Secondary | ICD-10-CM

## 2013-10-22 LAB — HEMOGLOBIN A1C: Hgb A1c MFr Bld: 5.8 % (ref 4.6–6.5)

## 2013-10-22 NOTE — Assessment & Plan Note (Signed)
Still seems to be well controlled but doesn't check Will check A1c

## 2013-10-22 NOTE — Assessment & Plan Note (Signed)
Lab Results  Component Value Date   LDLCALC 108* 03/05/2013   Normal glycemia without meds No meds due to risks of side effects

## 2013-10-22 NOTE — Assessment & Plan Note (Signed)
BP Readings from Last 3 Encounters:  10/22/13 128/70  08/18/13 150/80  04/23/13 158/80   Good now Would not treat anyway due to risk of orthostatic hypotension

## 2013-10-22 NOTE — Progress Notes (Signed)
   Subjective:    Patient ID: Alan BlightJames Peterson, male    DOB: May 25, 1940, [age] y.o.   MRN: [id number]  HPI Here with son Doing okay  Still having some back pain since April No falls since Still bent over--wonders about getting a brace  Still with sig hesitancy walking---does okay once he gets started No med changes Due to see neurologist in October  Notices DOE More limited by leg pains/spasms when walking No chest pain No dizziness or syncope. Ongoing balance problems---especially in AM  Not checking sugars Still hasn't made eye appointment  Voids okay Sleeps 6 hours straight without nocturia  Current Outpatient Prescriptions on File Prior to Visit  Medication Sig Dispense Refill  . carbidopa-levodopa (SINEMET IR) 25-100 MG per tablet Take 1 tablet by mouth 2 (two) times daily.      . rotigotine (NEUPRO) 2 MG/24HR Place 1 patch onto the skin daily.       No current facility-administered medications on file prior to visit.    No Known Allergies  Past Medical History  Diagnosis Date  . Diabetes mellitus   . Hyperlipidemia   . Hypertension   . BPH (benign prostatic hypertrophy)   . Parkinson's disease     Past Surgical History  Procedure Laterality Date  . Finger contracture release  1980    right, left 4th finger release 8/10    Family History  Problem Relation Age of Onset  . Hypertension Mother   . Coronary artery disease Neg Hx   . Diabetes Neg Hx   . Cancer Neg Hx     History   Social History  . Marital Status: Divorced    Spouse Name: N/A    Number of Children: 2  . Years of Education: N/A   Occupational History  . 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 Smoking status: Never Smoker   . Smokeless tobacco: Never Used  . Alcohol Use: Yes     Comment: beer  . Drug Use: Not on file  . Sexual Activity: Not on file   Other Topics Concern  . Not on file   Social History Narrative   [place]      Has a living will "somewhere"   Requests son Alan RuizJohn to make decisions for him   Would accept resuscitation attempts   Probably would want tube feeds   Review of Systems Still working--goes in 3 days per week (someone drives him)--from home otherwise Good appetite  Weight down 3# Generally sleeps okay     Objective:   Physical Exam  Constitutional: He appears well-developed. No distress.  Neck: No thyromegaly present.  Neck flexed and he can't extend  Cardiovascular: Normal rate, regular rhythm, normal heart sounds and intact distal pulses.  Exam reveals no gallop.   No murmur heard. Trigeminy 1+ PT pulses  Pulmonary/Chest: Effort normal and breath sounds normal. No respiratory distress. He has no wheezes. He has no rales.  Abdominal: Soft. There is no tenderness.  Musculoskeletal: He exhibits no edema and no tenderness.  Lymphadenopathy:    He has no cervical adenopathy.  Neurological:  Short steps, hunched over, stiff and bradykinesia  Skin:  Rubor in feet Mycotic toenails No ulcers  Psychiatric: He has a normal mood and affect. His behavior is normal.          Assessment & Plan:

## 2013-10-22 NOTE — Progress Notes (Signed)
Pre visit review using our clinic review tool, if applicable. No additional management support is needed unless otherwise documented below in the visit note. 

## 2013-10-22 NOTE — Assessment & Plan Note (Addendum)
Has had recent change in exercise tolerance No signs of infection No clear CHF Exam benign EKG shows no ischemic changes  Discussed options If ongoing problems would consider echo CXR if any pulmonary symptoms

## 2013-10-22 NOTE — Assessment & Plan Note (Signed)
Ongoing problems but no sig change in functional status Upcoming neurology follow up

## 2013-10-27 ENCOUNTER — Encounter: Payer: Self-pay | Admitting: *Deleted

## 2014-01-07 DIAGNOSIS — R269 Unspecified abnormalities of gait and mobility: Secondary | ICD-10-CM | POA: Diagnosis not present

## 2014-01-07 DIAGNOSIS — G629 Polyneuropathy, unspecified: Secondary | ICD-10-CM | POA: Diagnosis not present

## 2014-01-07 DIAGNOSIS — G20 Parkinson's disease: Secondary | ICD-10-CM | POA: Diagnosis not present

## 2014-04-24 ENCOUNTER — Encounter: Payer: Self-pay | Admitting: Internal Medicine

## 2014-04-24 ENCOUNTER — Ambulatory Visit (INDEPENDENT_AMBULATORY_CARE_PROVIDER_SITE_OTHER): Payer: Medicare Other | Admitting: Internal Medicine

## 2014-04-24 VITALS — BP 122/70 | HR 91 | Temp 97.8°F | Wt 183.8 lb

## 2014-04-24 DIAGNOSIS — I10 Essential (primary) hypertension: Secondary | ICD-10-CM | POA: Diagnosis not present

## 2014-04-24 DIAGNOSIS — E1140 Type 2 diabetes mellitus with diabetic neuropathy, unspecified: Secondary | ICD-10-CM

## 2014-04-24 DIAGNOSIS — E1142 Type 2 diabetes mellitus with diabetic polyneuropathy: Secondary | ICD-10-CM

## 2014-04-24 DIAGNOSIS — G20 Parkinson's disease: Secondary | ICD-10-CM

## 2014-04-24 DIAGNOSIS — Z0000 Encounter for general adult medical examination without abnormal findings: Secondary | ICD-10-CM

## 2014-04-24 DIAGNOSIS — N400 Enlarged prostate without lower urinary tract symptoms: Secondary | ICD-10-CM

## 2014-04-24 DIAGNOSIS — Z7189 Other specified counseling: Secondary | ICD-10-CM | POA: Diagnosis not present

## 2014-04-24 LAB — COMPREHENSIVE METABOLIC PANEL
ALT: 9 U/L (ref 0–53)
AST: 12 U/L (ref 0–37)
Albumin: 4.3 g/dL (ref 3.5–5.2)
Alkaline Phosphatase: 64 U/L (ref 39–117)
BUN: 20 mg/dL (ref 6–23)
CALCIUM: 9.4 mg/dL (ref 8.4–10.5)
CO2: 30 meq/L (ref 19–32)
Chloride: 105 mEq/L (ref 96–112)
Creatinine, Ser: 0.96 mg/dL (ref 0.40–1.50)
GFR: 81 mL/min (ref >60.00)
GLUCOSE: 114 mg/dL — AB (ref 70–99)
Potassium: 4.2 mEq/L (ref 3.5–5.1)
SODIUM: 141 meq/L (ref 135–145)
Total Bilirubin: 0.6 mg/dL (ref 0.2–1.2)
Total Protein: 7.3 g/dL (ref 6.0–8.3)

## 2014-04-24 LAB — CBC WITH DIFFERENTIAL/PLATELET
BASOS ABS: 0 10*3/uL (ref 0.0–0.1)
Basophils Relative: 0.3 % (ref 0.0–3.0)
EOS ABS: 0 10*3/uL (ref 0.0–0.7)
Eosinophils Relative: 0.2 % (ref 0.0–5.0)
HCT: 43.4 % (ref 39.0–52.0)
HEMOGLOBIN: 15 g/dL (ref 13.0–17.0)
LYMPHS ABS: 1.2 10*3/uL (ref 0.7–4.0)
LYMPHS PCT: 14 % (ref 12.0–46.0)
MCHC: 34.5 g/dL (ref 30.0–36.0)
MCV: 90.6 fl (ref 78.0–100.0)
MONO ABS: 0.5 10*3/uL (ref 0.1–1.0)
Monocytes Relative: 5.7 % (ref 3.0–12.0)
NEUTROS ABS: 6.6 10*3/uL (ref 1.4–7.7)
Neutrophils Relative %: 79.8 % — ABNORMAL HIGH (ref 43.0–77.0)
Platelets: 165 10*3/uL (ref 150.0–400.0)
RBC: 4.79 Mil/uL (ref 4.22–5.81)
RDW: 13.2 % (ref 11.5–15.5)
WBC: 8.2 10*3/uL (ref 4.0–10.5)

## 2014-04-24 LAB — MICROALBUMIN / CREATININE URINE RATIO
Creatinine,U: 199.4 mg/dL
MICROALB/CREAT RATIO: 2.1 mg/g (ref 0.0–30.0)
Microalb, Ur: 4.1 mg/dL — ABNORMAL HIGH (ref 0.0–1.9)

## 2014-04-24 LAB — LIPID PANEL
Cholesterol: 175 mg/dL (ref 0–200)
HDL: 44.9 mg/dL (ref >39.00)
LDL Cholesterol: 116 mg/dL — ABNORMAL HIGH (ref 0–99)
NonHDL: 130.1
Total CHOL/HDL Ratio: 4
Triglycerides: 69 mg/dL (ref 0.0–149.0)
VLDL: 13.8 mg/dL (ref 0.0–40.0)

## 2014-04-24 LAB — HM DIABETES FOOT EXAM

## 2014-04-24 LAB — HEMOGLOBIN A1C: HEMOGLOBIN A1C: 5.8 % (ref 4.6–6.5)

## 2014-04-24 LAB — T4, FREE: FREE T4: 0.91 ng/dL (ref 0.60–1.60)

## 2014-04-24 NOTE — Patient Instructions (Signed)
Please set up an eye appointment with [facility] as soon as possible.

## 2014-04-24 NOTE — Assessment & Plan Note (Signed)
BP Readings from Last 3 Encounters:  04/24/14 122/70  10/22/13 128/70  08/18/13 150/80   Has been fine without Rx High risk of orthostatic problems with Parkinson's--not sure ACEI good idea even if proteinuria (but will check)

## 2014-04-24 NOTE — Assessment & Plan Note (Signed)
Worsening functional status Discussed getting more help at home--he is resistant to this also

## 2014-04-24 NOTE — Assessment & Plan Note (Signed)
Hopefully still good control without meds Due for labs Urged him to get eye appt--he has been resistant. Asked son to make appt

## 2014-04-24 NOTE — Progress Notes (Signed)
Subjective:    Patient ID: Alan Peterson, male    DOB: 13-Nov-1940, 74 y.o.   MRN: [id number]  HPI Here for Medicare wellness visit and follow up of diabetes and other chronic medical conditions Reviewed advanced directives and wellness form Has had some falls--did hurt back with recent one Sees Dr Chriss CzarKeith Miller for neurology, hasn't seen eye doctor in years ([place]) No alcohol or tobacco Does try to do some exercise--but difficult for him Needs help with instrumental ADLs and for shower and some transfers. Had aide 2 hours twice a week and son lives nearby Hearing stopped up last week--some problems. Vision is okay. No longer wears glasses since not driving No depression or anhedonia--still enjoys his work No apparent cognitive problems  Parkinson's has worsened some Hurt back in fall and now is more hunched over Still walks with cane Only goes to work 1-2 times per week---mostly working at home  Not checking sugars No Rx for diabetes Still with some sensory loss in legs and arms No sig pain though--- no ulcers  Still has some DOE No change since last visit No chest pain or palpitations No dizziness or syncope Does note fast breathing with activity  Voids a lot in day Nocturia x 1 Some trouble with urge incontinence-- does use a pad on chair just in case  Current Outpatient Prescriptions on File Prior to Visit  Medication Sig Dispense Refill  . carbidopa-levodopa (SINEMET IR) 25-100 MG per tablet Take 1 tablet by mouth 2 (two) times daily.    . rotigotine (NEUPRO) 2 MG/24HR Place 1 patch onto the skin daily.     No current facility-administered medications on file prior to visit.    No Known Allergies  Past Medical History  Diagnosis Date  . Diabetes mellitus   . Hyperlipidemia   . Hypertension   . BPH (benign prostatic hypertrophy)   . Parkinson's disease     Past Surgical History  Procedure Laterality Date  . Finger contracture release  1980    right,  left 4th finger release 8/10    Family History  Problem Relation Age of Onset  . Hypertension Mother   . Coronary artery disease Neg Hx   . Diabetes Neg Hx   . Cancer Neg Hx     History   Social History  . Marital Status: Divorced    Spouse Name: N/A  . Number of Children: 2  . Years of Education: N/A   Occupational History  . 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 Smoking status: Never Smoker   . Smokeless tobacco: Never Used  . Alcohol Use: Yes     Comment: beer  . Drug Use: Not on file  . Sexual Activity: Not on file   Other Topics Concern  . Not on file   Social History Narrative   Son lives with him [place] the farm      Has a living will "somewhere"   Requests son [name] to make decisions for him   Would accept resuscitation attempts   Probably would want tube feeds   Review of Systems Appetite is good Weight down 5# since last visit Sleeps fine Teeth are bad--- will probably need them pulled. Hasn't seen dentist Some pain in fingers---mostly stiffness    Objective:   Physical Exam  Constitutional: He is oriented to person, place, and time. He appears well-developed and well-nourished. No distress.  HENT:  Mouth/Throat: Oropharynx  is clear and moist. No oropharyngeal exudate.  Neck: No thyromegaly present.  Cardiovascular: Normal rate, regular rhythm and normal heart sounds.  Exam reveals no gallop.   No murmur heard. Normal PT pulses but absent DP  Pulmonary/Chest: Effort normal and breath sounds normal. No respiratory distress. He has no wheezes. He has no rales.  Abdominal: Soft. There is no tenderness.  Musculoskeletal: He exhibits no edema or tenderness.  Lymphadenopathy:    He has no cervical adenopathy.  Neurological: He is alert and oriented to person, place, and time.  President-- "Alan Peterson, Alan Peterson, Alan Peterson" [phone] [id number]---- then d-l-r-[id number]-w 3/3  Decreased sensation in feet    Skin:  Feet purplish by toes and cold No ulcers or rash   Psychiatric: He has a normal mood and affect. His behavior is normal.          Assessment & Plan:

## 2014-04-24 NOTE — Assessment & Plan Note (Signed)
Mild symptoms  No Rx indicated

## 2014-04-24 NOTE — Assessment & Plan Note (Signed)
See social history 

## 2014-04-24 NOTE — Progress Notes (Signed)
Pre visit review using our clinic review tool, if applicable. No additional management support is needed unless otherwise documented below in the visit note. 

## 2014-04-24 NOTE — Assessment & Plan Note (Signed)
Sensory without pain so no Rx

## 2014-04-24 NOTE — Assessment & Plan Note (Signed)
I have personally reviewed the Medicare Annual Wellness questionnaire and have noted 1. The patient's medical and social history 2. Their use of alcohol, tobacco or illicit drugs 3. Their current medications and supplements 4. The patient's functional ability including ADL's, fall risks, home safety risks and hearing or visual             impairment. 5. Diet and physical activities 6. Evidence for depression or mood disorders  The patients weight, height, BMI and visual acuity have been recorded in the chart I have made referrals, counseling and provided education to the patient based review of the above and I have provided the pt with a written personalized care plan for preventive services.  I have provided you with a copy of your personalized plan for preventive services. Please take the time to review along with your updated medication list.  He refuses flu, pneumonia and shingles vaccines No cancer screening by his choice

## 2014-04-27 ENCOUNTER — Encounter: Payer: Self-pay | Admitting: *Deleted

## 2014-10-27 ENCOUNTER — Encounter: Payer: Self-pay | Admitting: Internal Medicine

## 2014-10-27 ENCOUNTER — Ambulatory Visit (INDEPENDENT_AMBULATORY_CARE_PROVIDER_SITE_OTHER): Payer: Medicare Other | Admitting: Internal Medicine

## 2014-10-27 VITALS — BP 140/80 | HR 72 | Temp 98.1°F | Ht 69.0 in | Wt 186.0 lb

## 2014-10-27 DIAGNOSIS — R7301 Impaired fasting glucose: Secondary | ICD-10-CM

## 2014-10-27 DIAGNOSIS — N400 Enlarged prostate without lower urinary tract symptoms: Secondary | ICD-10-CM

## 2014-10-27 DIAGNOSIS — G20 Parkinson's disease: Secondary | ICD-10-CM

## 2014-10-27 DIAGNOSIS — I10 Essential (primary) hypertension: Secondary | ICD-10-CM

## 2014-10-27 LAB — HEMOGLOBIN A1C: Hgb A1c MFr Bld: 5.5 % (ref 4.6–6.5)

## 2014-10-27 NOTE — Assessment & Plan Note (Signed)
BP Readings from Last 3 Encounters:  10/27/14 140/80  04/24/14 122/70  10/22/13 128/70   Reasonable still without meds

## 2014-10-27 NOTE — Assessment & Plan Note (Signed)
Continues on the meds Overdue for neuro follow up Hasn't driven in over a year--now working from home

## 2014-10-27 NOTE — Progress Notes (Signed)
Pre visit review using our clinic review tool, if applicable. No additional management support is needed unless otherwise documented below in the visit note. 

## 2014-10-27 NOTE — Assessment & Plan Note (Signed)
Normal A1c without meds Will downgrade diagnosis (and quality measures) therefore Check A1c again

## 2014-10-27 NOTE — Progress Notes (Signed)
   Subjective:    Patient ID: Alan Peterson, male    DOB: 08-23-1940, 74 y.o.   MRN: [id number]  HPI Here for follow up of diabetes and other chronic medical conditions With son as usual  Still doesn't check sugars Not clear he really is diabetic now No meds Ongoing sensory changes-- no pain  Parkinson's is about the same Missed last neurology appt--is rescheduling  Voids okay Nocturia x 2 Some daytime urgency and frequency  Current Outpatient Prescriptions on File Prior to Visit  Medication Sig Dispense Refill  . carbidopa-levodopa (SINEMET IR) 25-100 MG per tablet Take 1 tablet by mouth 2 (two) times daily.     No current facility-administered medications on file prior to visit.    No Known Allergies  Past Medical History  Diagnosis Date  . Diabetes mellitus   . Hyperlipidemia   . Hypertension   . BPH (benign prostatic hypertrophy)   . Parkinson's disease     Past Surgical History  Procedure Laterality Date  . Finger contracture release  1980    right, left 4th finger release 8/10    Family History  Problem Relation Age of Onset  . Hypertension Mother   . Coronary artery disease Neg Hx   . Diabetes Neg Hx   . Cancer Neg Hx     Social History   Social History  . Marital Status: Divorced    Spouse Name: N/A  . Number of Children: 2  . Years of Education: N/A   Occupational History  . 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 Smoking status: Never Smoker   . Smokeless tobacco: Never Used  . Alcohol Use: Yes     Comment: beer  . Drug Use: Not on file  . Sexual Activity: Not on file   Other Topics Concern  . Not on file   Social History Narrative   Son lives with him [place] the farm      Has a living will "somewhere"   Requests son [name] to make decisions for him   Would accept resuscitation attempts   Probably would want tube feeds   Review of Systems Sleeps well Appetite is good Weight is  up 2# Congestion and rhinorrhea-- no meds. Goes back a year Some eye gunk in AM but no redness Now working from home    Objective:   Physical Exam  Constitutional: No distress.  Neck: Normal range of motion. Neck supple. No thyromegaly present.  Cardiovascular: Normal rate, regular rhythm and normal heart sounds.  Exam reveals no gallop.   No murmur heard. Pulmonary/Chest: Effort normal and breath sounds normal. No respiratory distress. He has no wheezes. He has no rales.  Musculoskeletal: He exhibits no edema.  Flexed neck  Lymphadenopathy:    He has no cervical adenopathy.  Neurological:  Not overly stiff but bradykinesia  Psychiatric: He has a normal mood and affect. His behavior is normal.          Assessment & Plan:

## 2014-10-27 NOTE — Assessment & Plan Note (Signed)
Ongoing symptoms but not severe Will hold off on meds

## 2014-10-28 ENCOUNTER — Encounter: Payer: Self-pay | Admitting: *Deleted

## 2014-11-03 DIAGNOSIS — H2513 Age-related nuclear cataract, bilateral: Secondary | ICD-10-CM | POA: Diagnosis not present

## 2014-11-09 DIAGNOSIS — H60503 Unspecified acute noninfective otitis externa, bilateral: Secondary | ICD-10-CM | POA: Diagnosis not present

## 2014-11-09 DIAGNOSIS — H6123 Impacted cerumen, bilateral: Secondary | ICD-10-CM | POA: Diagnosis not present

## 2014-12-07 ENCOUNTER — Ambulatory Visit (INDEPENDENT_AMBULATORY_CARE_PROVIDER_SITE_OTHER): Payer: Medicare Other | Admitting: Internal Medicine

## 2014-12-07 ENCOUNTER — Encounter: Payer: Self-pay | Admitting: Internal Medicine

## 2014-12-07 VITALS — BP 124/78 | HR 72 | Temp 98.5°F | Wt 189.0 lb

## 2014-12-07 DIAGNOSIS — B372 Candidiasis of skin and nail: Secondary | ICD-10-CM

## 2014-12-07 MED ORDER — KETOCONAZOLE 2 % EX CREA: 1.0000 "application " | TOPICAL_CREAM | Freq: Two times a day (BID) | CUTANEOUS | Status: DC

## 2014-12-07 NOTE — Progress Notes (Signed)
Subjective:    Patient ID: Alan Peterson, male    DOB: Mar 10, 1941, 74 y.o.   MRN: [id number]  HPI  Pt presents to the clinic today with c/o a rash under his left armpit. He noticed this 1 week ago. The rash has now spread under his left breast. The rash itches and burns. He has noticed increased in sweat under the left armpit- insist that he persistently drains yellow fluid. He has never had a rash like this before. He has not tried anything OTC. He has been trying to keep it dry with a dry rag, but the rag gets soaked up within 30-45 minutes.  Review of Systems      Past Medical History  Diagnosis Date  . Diabetes mellitus   . Hyperlipidemia   . Hypertension   . BPH (benign prostatic hypertrophy)   . Parkinson's disease     Current Outpatient Prescriptions  Medication Sig Dispense Refill  . carbidopa-levodopa (SINEMET IR) 25-100 MG per tablet Take 1 tablet by mouth 2 (two) times daily.     No current facility-administered medications for this visit.    No Known Allergies  Family History  Problem Relation Age of Onset  . Hypertension Mother   . Coronary artery disease Neg Hx   . Diabetes Neg Hx   . Cancer Neg Hx     Social History   Social History  . Marital Status: Divorced    Spouse Name: N/A  . Number of Children: 2  . Years of Education: N/A   Occupational History  . 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 Smoking status: Never Smoker   . Smokeless tobacco: Never Used  . Alcohol Use: Yes     Comment: beer  . Drug Use: Not on file  . Sexual Activity: Not on file   Other Topics Concern  . Not on file   Social History Narrative   Son lives with him [place] the farm      Has a living will "somewhere"   Requests son [name] to make decisions for him   Would accept resuscitation attempts   Probably would want tube feeds     Constitutional: Denies fever, malaise, fatigue, headache or abrupt weight  changes.  Respiratory: Denies difficulty breathing, shortness of breath, cough or sputum production.   Cardiovascular: Denies chest pain, chest tightness, palpitations or swelling in the hands or feet.  Skin: Pt reports rash. Denies ulcercations.    No other specific complaints in a complete review of systems (except as listed in HPI above).  Objective:   Physical Exam   BP 124/78 mmHg  Pulse 72  Temp(Src) 98.5 F (36.9 C) (Oral)  Wt 189 lb (85.73 kg)  SpO2 92% Wt Readings from Last 3 Encounters:  12/07/14 189 lb (85.73 kg)  10/27/14 186 lb (84.369 kg)  04/24/14 183 lb 12 oz (83.348 kg)    General: Appears his stated age, chronically ill appearing in NAD. Skin: He has a severe yeast infection of the left armpit, extending under the left breast. There is serous drainage noted.  BMET    Component Value Date/Time   NA 141 04/24/2014 1128   K 4.2 04/24/2014 1128   CL 105 04/24/2014 1128   CO2 30 04/24/2014 1128   GLUCOSE 114* 04/24/2014 1128   BUN 20 04/24/2014 1128   CREATININE 0.96 04/24/2014 1128   CALCIUM 9.4 04/24/2014 1128   GFRNONAA >60 11/05/2009  0700   GFRAA  11/05/2009 0700    >60        The eGFR has been calculated using the MDRD equation. This calculation has not been validated in all clinical situations. eGFR's persistently <60 mL/min signify possible Chronic Kidney Disease.    Lipid Panel     Component Value Date/Time   CHOL 175 04/24/2014 1128   TRIG 69.0 04/24/2014 1128   HDL 44.90 04/24/2014 1128   CHOLHDL 4 04/24/2014 1128   VLDL 13.8 04/24/2014 1128   LDLCALC 116* 04/24/2014 1128    CBC    Component Value Date/Time   WBC 8.2 04/24/2014 1128   RBC 4.79 04/24/2014 1128   HGB 15.0 04/24/2014 1128   HCT 43.4 04/24/2014 1128   PLT 165.0 04/24/2014 1128   MCV 90.6 04/24/2014 1128   MCH 30.7 11/05/2009 0700   MCHC 34.5 04/24/2014 1128   RDW 13.2 04/24/2014 1128   LYMPHSABS 1.2 04/24/2014 1128   MONOABS 0.5 04/24/2014 1128   EOSABS  0.0 04/24/2014 1128   BASOSABS 0.0 04/24/2014 1128    Hgb A1C Lab Results  Component Value Date   HGBA1C 5.5 10/27/2014        Assessment & Plan:   Candidal Intertrigo:  Try to keep the area as dry as possible but avoid rubbing the area eRx for Ketoconazole cream Return precautions given  Follow up with PCP in 1 week 

## 2014-12-07 NOTE — Patient Instructions (Signed)
Intertrigo Intertrigo is a skin condition that occurs in between folds of skin in places on the body that rub together a lot and do not get much ventilation. It is caused by heat, moisture, friction, sweat retention, and lack of air circulation, which produces red, irritated patches and, sometimes, scaling or drainage. People who have diabetes, who are obese, or who have treatment with antibiotics are at increased risk for intertrigo. The most common sites for intertrigo to occur include:  The groin.  The breasts.  The armpits.  Folds of abdominal skin.  Webbed spaces between the fingers or toes. Intertrigo may be aggravated by:  Sweat.  Feces.  Yeast or bacteria that are present near skin folds.  Urine.  Vaginal discharge. HOME CARE INSTRUCTIONS  The following steps can be taken to reduce friction and keep the affected area cool and dry:  Expose skin folds to the air.  Keep deep skin folds separated with cotton or linen cloth. Avoid tight fitting clothing that could cause chafing.  Wear open-toed shoes or sandals to help reduce moisture between the toes.  Apply absorbent powders to affected areas as directed by your caregiver.  Apply over-the-counter barrier pastes, such as zinc oxide, as directed by your caregiver.  If you develop a fungal infection in the affected area, your caregiver may have you use antifungal creams. SEEK MEDICAL CARE IF:   The rash is not improving after 1 week of treatment.  The rash is getting worse (more red, more swollen, more painful, or spreading).  You have a fever or chills. MAKE SURE YOU:   Understand these instructions.  Will watch your condition.  Will get help right away if you are not doing well or get worse. Document Released: 02/27/2005 Document Revised: 05/22/2011 Document Reviewed: 08/12/2009 ExitCare Patient Information 2015 [employer]. This information is not intended to replace advice given to you by your health  care provider. Make sure you discuss any questions you have with your health care provider.  

## 2014-12-07 NOTE — Progress Notes (Signed)
Pre visit review using our clinic review tool, if applicable. No additional management support is needed unless otherwise documented below in the visit note. 

## 2014-12-14 ENCOUNTER — Ambulatory Visit (INDEPENDENT_AMBULATORY_CARE_PROVIDER_SITE_OTHER): Payer: Medicare Other | Admitting: Internal Medicine

## 2014-12-14 ENCOUNTER — Encounter: Payer: Self-pay | Admitting: Internal Medicine

## 2014-12-14 VITALS — BP 160/80 | HR 66 | Temp 98.1°F | Wt 188.0 lb

## 2014-12-14 DIAGNOSIS — B372 Candidiasis of skin and nail: Secondary | ICD-10-CM | POA: Insufficient documentation

## 2014-12-14 MED ORDER — FLUCONAZOLE 100 MG PO TABS: 100.0000 mg | ORAL_TABLET | Freq: Every day | ORAL | Status: DC

## 2014-12-14 NOTE — Progress Notes (Signed)
   Subjective:    Patient ID: Alan Peterson, male    DOB: 1940/11/10, 74 y.o.   MRN: [id number]  HPI Here for follow up on rash With DIL  Rash is some better but persistent Does have some itching No discharge from rash  Not sick No fever  Current Outpatient Prescriptions on File Prior to Visit  Medication Sig Dispense Refill  . carbidopa-levodopa (SINEMET IR) 25-100 MG per tablet Take 1 tablet by mouth 2 (two) times daily.    Marland Kitchen ketoconazole (NIZORAL) 2 % cream Apply 1 application topically 2 (two) times daily. 60 g 0   No current facility-administered medications on file prior to visit.    No Known Allergies  Past Medical History  Diagnosis Date  . Diabetes mellitus   . Hyperlipidemia   . Hypertension   . BPH (benign prostatic hypertrophy)   . Parkinson's disease     Past Surgical History  Procedure Laterality Date  . Finger contracture release  1980    right, left 4th finger release 8/10    Family History  Problem Relation Age of Onset  . Hypertension Mother   . Coronary artery disease Neg Hx   . Diabetes Neg Hx   . Cancer Neg Hx     Social History   Social History  . Marital Status: Divorced    Spouse Name: N/A  . Number of Children: 2  . Years of Education: N/A   Occupational History  . 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 Smoking status: Never Smoker   . Smokeless tobacco: Never Used  . Alcohol Use: Yes     Comment: beer  . Drug Use: Not on file  . Sexual Activity: Not on file   Other Topics Concern  . Not on file   Social History Narrative   Son lives with him [place] the farm      Has a living will "somewhere"   Requests son Alan Peterson to make decisions for him   Would accept resuscitation attempts   Probably would want tube feeds   Review of Systems Has had increased sweating from axilla at times Has some itching at rectum also--- no blood there Infection in ears---went to ENT for ear  cleaning and got antibiotic drops    Objective:   Physical Exam  Genitourinary:  No clear perianal rash---does have some diffuse redness though  Skin:  Widespread red rash around axilla (not inside much now) confluent all the way around to under breast  Scaling noted          Assessment & Plan:

## 2014-12-14 NOTE — Assessment & Plan Note (Signed)
Some better with the cream but will give fluconazole to try for now

## 2014-12-14 NOTE — Progress Notes (Signed)
Pre visit review using our clinic review tool, if applicable. No additional management support is needed unless otherwise documented below in the visit note. 

## 2015-02-15 ENCOUNTER — Telehealth: Payer: Self-pay | Admitting: Internal Medicine

## 2015-02-15 NOTE — Telephone Encounter (Signed)
Pt dropped off a Disability Parking Placard form to be filled out. He has a stamped envelope and check to send everything to the addressed envelope when finished. The best number to contact him is [phone]. Placing all ppw in Dr. Karle StarchLetvak's rx tower slot.

## 2015-02-16 NOTE — Telephone Encounter (Signed)
Patient notified form completed and [facility]

## 2015-02-16 NOTE — Telephone Encounter (Signed)
Form signed No charge 

## 2015-02-16 NOTE — Telephone Encounter (Signed)
Form on your desk  

## 2015-03-18 DIAGNOSIS — G609 Hereditary and idiopathic neuropathy, unspecified: Secondary | ICD-10-CM | POA: Diagnosis not present

## 2015-03-18 DIAGNOSIS — G20 Parkinson's disease: Secondary | ICD-10-CM | POA: Diagnosis not present

## 2015-03-18 DIAGNOSIS — R269 Unspecified abnormalities of gait and mobility: Secondary | ICD-10-CM | POA: Diagnosis not present

## 2015-03-18 DIAGNOSIS — Z6827 Body mass index (BMI) 27.0-27.9, adult: Secondary | ICD-10-CM | POA: Diagnosis not present

## 2015-05-04 ENCOUNTER — Ambulatory Visit (INDEPENDENT_AMBULATORY_CARE_PROVIDER_SITE_OTHER): Payer: Medicare Other | Admitting: Internal Medicine

## 2015-05-04 ENCOUNTER — Encounter: Payer: BLUE CROSS/BLUE SHIELD | Admitting: Internal Medicine

## 2015-05-04 ENCOUNTER — Encounter: Payer: Self-pay | Admitting: Internal Medicine

## 2015-05-04 VITALS — BP 118/80 | HR 86 | Temp 97.6°F | Resp 98 | Ht 69.0 in | Wt 192.2 lb

## 2015-05-04 DIAGNOSIS — N400 Enlarged prostate without lower urinary tract symptoms: Secondary | ICD-10-CM

## 2015-05-04 DIAGNOSIS — G20 Parkinson's disease: Secondary | ICD-10-CM

## 2015-05-04 DIAGNOSIS — Z0000 Encounter for general adult medical examination without abnormal findings: Secondary | ICD-10-CM

## 2015-05-04 DIAGNOSIS — R7301 Impaired fasting glucose: Secondary | ICD-10-CM

## 2015-05-04 DIAGNOSIS — B372 Candidiasis of skin and nail: Secondary | ICD-10-CM | POA: Diagnosis not present

## 2015-05-04 DIAGNOSIS — Z7189 Other specified counseling: Secondary | ICD-10-CM

## 2015-05-04 LAB — CBC WITH DIFFERENTIAL/PLATELET
BASOS PCT: 0.3 % (ref 0.0–3.0)
Basophils Absolute: 0 10*3/uL (ref 0.0–0.1)
EOS ABS: 0 10*3/uL (ref 0.0–0.7)
EOS PCT: 0.4 % (ref 0.0–5.0)
HEMATOCRIT: 43.3 % (ref 39.0–52.0)
HEMOGLOBIN: 14.8 g/dL (ref 13.0–17.0)
LYMPHS PCT: 12.9 % (ref 12.0–46.0)
Lymphs Abs: 1.3 10*3/uL (ref 0.7–4.0)
MCHC: 34.1 g/dL (ref 30.0–36.0)
MCV: 92 fl (ref 78.0–100.0)
Monocytes Absolute: 0.7 10*3/uL (ref 0.1–1.0)
Monocytes Relative: 6.7 % (ref 3.0–12.0)
Neutro Abs: 7.9 10*3/uL — ABNORMAL HIGH (ref 1.4–7.7)
Neutrophils Relative %: 79.7 % — ABNORMAL HIGH (ref 43.0–77.0)
Platelets: 200 10*3/uL (ref 150.0–400.0)
RBC: 4.71 Mil/uL (ref 4.22–5.81)
RDW: 12.8 % (ref 11.5–15.5)
WBC: 9.9 10*3/uL (ref 4.0–10.5)

## 2015-05-04 LAB — COMPREHENSIVE METABOLIC PANEL
ALBUMIN: 4.3 g/dL (ref 3.5–5.2)
ALT: 5 U/L (ref 0–53)
AST: 11 U/L (ref 0–37)
Alkaline Phosphatase: 59 U/L (ref 39–117)
BUN: 21 mg/dL (ref 6–23)
CO2: 27 mEq/L (ref 19–32)
Calcium: 9.3 mg/dL (ref 8.4–10.5)
Chloride: 105 mEq/L (ref 96–112)
Creatinine, Ser: 0.88 mg/dL (ref 0.40–1.50)
GFR: 89.31 mL/min (ref >60.00)
Glucose, Bld: 123 mg/dL — ABNORMAL HIGH (ref 70–99)
POTASSIUM: 4.1 meq/L (ref 3.5–5.1)
SODIUM: 141 meq/L (ref 135–145)
Total Bilirubin: 0.6 mg/dL (ref 0.2–1.2)
Total Protein: 7.3 g/dL (ref 6.0–8.3)

## 2015-05-04 LAB — HEMOGLOBIN A1C: Hgb A1c MFr Bld: 5.9 % (ref 4.6–6.5)

## 2015-05-04 LAB — HM DIABETES FOOT EXAM

## 2015-05-04 MED ORDER — KETOCONAZOLE 2 % EX CREA: 1.0000 "application " | TOPICAL_CREAM | Freq: Two times a day (BID) | CUTANEOUS | Status: DC

## 2015-05-04 NOTE — Assessment & Plan Note (Signed)
I have personally reviewed the Medicare Annual Wellness questionnaire and have noted 1. The patient's medical and social history 2. Their use of alcohol, tobacco or illicit drugs 3. Their current medications and supplements 4. The patient's functional ability including ADL's, fall risks, home safety risks and hearing or visual             impairment. 5. Diet and physical activities 6. Evidence for depression or mood disorders  The patients weight, height, BMI and visual acuity have been recorded in the chart I have made referrals, counseling and provided education to the patient based review of the above and I have provided the pt with a written personalized care plan for preventive services.  I have provided you with a copy of your personalized plan for preventive services. Please take the time to review along with your updated medication list.  He prefers no immunizations No cancer screening after discussion Unable to exercise

## 2015-05-04 NOTE — Assessment & Plan Note (Signed)
Will renew ketoconazole Fluconazole was expensive--will hold off on this

## 2015-05-04 NOTE — Addendum Note (Signed)
Addended by: Eual Fines on: 05/04/2015 10:17 AM   Modules accepted: Orders, Medications

## 2015-05-04 NOTE — Progress Notes (Signed)
Subjective:    Patient ID: Alan Peterson, male    DOB: [birth year], 75 y.o.   MRN: [id number]  HPI Here with son's girlfriend for Medicare wellness and follow up of chronic medical conditions Reviewed advanced directives Reviewed other doctors-- Dr Melvyn Neth at [facility] is neurologist Eyes checked about 6 months ago---vision okay Ears stopped up--some trouble with hearing No alcohol or tobacco No ER or hospital visits in past year Uses home body vibrator and tries to walk around house. Not able to do much exercise Not depressed--- not anhedonic Needs assist with bathing-- nurses aide twice a week. 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 carbidopa-levodopa (SINEMET IR) 25-100 MG per tablet Take 1 tablet by mouth 2 (two) times daily.    Marland Kitchen ketoconazole (NIZORAL) 2 % cream Apply 1 application topically 2 (two) times daily. 60 g 0   No current facility-administered medications on file prior to visit.    No Known  Allergies  Past Medical History  Diagnosis Date  . Diabetes mellitus   . Hyperlipidemia   . Hypertension   . BPH (benign prostatic hypertrophy)   . Parkinson's disease     Past Surgical History  Procedure Laterality Date  . Finger contracture release  1980    right, left 4th finger release 8/10    Family History  Problem Relation Age of Onset  . Hypertension Mother   . Coronary artery disease Neg Hx   . Diabetes Neg Hx   . Cancer Neg Hx     Social History   Social History  . Marital Status: Divorced    Spouse Name: N/A  . Number of Children: 2  . Years of Education: N/A   Occupational History  . 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 Smoking status: Never Smoker   . Smokeless tobacco: Never Used  . Alcohol Use: Yes     Comment: beer  . Drug Use: Not on file  . Sexual Activity: Not on file   Other Topics Concern  . Not on file   Social History Narrative   [place] farm      Has a living will "somewhere"   Requests son [name] to make decisions for him   Would accept resuscitation attempts   Probably would want tube feeds   Review of Systems Teeth in bad shape--- probably needs them removed Appetite is good---weight is up a couple of pounds Sleeps well Wears seat belt Bowels move every 2 days or  so--close to constipation. Doesn't take anything No suspicious skin lesions Some finger stiffness. No set back or joint pain    Objective:   Physical Exam  Constitutional: He is oriented to person, place, and time. He appears well-developed. No distress.  Neck: No thyromegaly present.  Flexed and cannot extend much  Cardiovascular: Normal rate, regular rhythm, normal heart sounds and intact distal pulses.  Exam reveals no gallop.   No murmur heard. Pulmonary/Chest: Effort normal and breath sounds normal. No respiratory distress. He has no wheezes. He has no rales.  Abdominal: Soft. There is  no tenderness.  Musculoskeletal: He exhibits no edema or tenderness.  Toes purplish and cool but pulses palpable  Lymphadenopathy:    He has no cervical adenopathy.  Neurological: He is alert and oriented to person, place, and time.  President-- "Benson [place], Bush" [phone] D-l-o-r-w Recall -- 2/3  No tremor Moderate bradykinesia and mildly increased tone  Skin:  Intertrigo in left axilla          Assessment & Plan:

## 2015-05-04 NOTE — Assessment & Plan Note (Signed)
Not diabetic Will recheck A1c though

## 2015-05-04 NOTE — Assessment & Plan Note (Signed)
See social history Gave blank forms and urged him to complete and notarize

## 2015-05-04 NOTE — Assessment & Plan Note (Signed)
Ongoing functional decline Keeps up with neurologist Now mostly home bound

## 2015-05-04 NOTE — Assessment & Plan Note (Signed)
Symptoms but not enough for Rx

## 2015-05-04 NOTE — Progress Notes (Signed)
Pre visit review using our clinic review tool, if applicable. No additional management support is needed unless otherwise documented below in the visit note. 

## 2015-06-18 DIAGNOSIS — Z6828 Body mass index (BMI) 28.0-28.9, adult: Secondary | ICD-10-CM | POA: Diagnosis not present

## 2015-06-18 DIAGNOSIS — R269 Unspecified abnormalities of gait and mobility: Secondary | ICD-10-CM | POA: Diagnosis not present

## 2015-06-18 DIAGNOSIS — G20 Parkinson's disease: Secondary | ICD-10-CM | POA: Diagnosis not present

## 2015-06-18 DIAGNOSIS — G609 Hereditary and idiopathic neuropathy, unspecified: Secondary | ICD-10-CM | POA: Diagnosis not present

## 2015-06-19 DIAGNOSIS — R2689 Other abnormalities of gait and mobility: Secondary | ICD-10-CM | POA: Diagnosis not present

## 2015-06-19 DIAGNOSIS — G20 Parkinson's disease: Secondary | ICD-10-CM | POA: Diagnosis not present

## 2015-06-19 DIAGNOSIS — M2560 Stiffness of unspecified joint, not elsewhere classified: Secondary | ICD-10-CM | POA: Diagnosis not present

## 2015-06-19 DIAGNOSIS — M542 Cervicalgia: Secondary | ICD-10-CM | POA: Diagnosis not present

## 2015-06-19 DIAGNOSIS — G629 Polyneuropathy, unspecified: Secondary | ICD-10-CM | POA: Diagnosis not present

## 2015-06-19 DIAGNOSIS — M6281 Muscle weakness (generalized): Secondary | ICD-10-CM | POA: Diagnosis not present

## 2015-06-19 DIAGNOSIS — Z9181 History of falling: Secondary | ICD-10-CM | POA: Diagnosis not present

## 2015-06-22 DIAGNOSIS — G629 Polyneuropathy, unspecified: Secondary | ICD-10-CM | POA: Diagnosis not present

## 2015-06-22 DIAGNOSIS — G20 Parkinson's disease: Secondary | ICD-10-CM | POA: Diagnosis not present

## 2015-06-22 DIAGNOSIS — R2689 Other abnormalities of gait and mobility: Secondary | ICD-10-CM | POA: Diagnosis not present

## 2015-06-22 DIAGNOSIS — M6281 Muscle weakness (generalized): Secondary | ICD-10-CM | POA: Diagnosis not present

## 2015-06-22 DIAGNOSIS — M2560 Stiffness of unspecified joint, not elsewhere classified: Secondary | ICD-10-CM | POA: Diagnosis not present

## 2015-06-22 DIAGNOSIS — Z9181 History of falling: Secondary | ICD-10-CM | POA: Diagnosis not present

## 2015-06-23 DIAGNOSIS — R2689 Other abnormalities of gait and mobility: Secondary | ICD-10-CM | POA: Diagnosis not present

## 2015-06-23 DIAGNOSIS — Z9181 History of falling: Secondary | ICD-10-CM | POA: Diagnosis not present

## 2015-06-23 DIAGNOSIS — M6281 Muscle weakness (generalized): Secondary | ICD-10-CM | POA: Diagnosis not present

## 2015-06-23 DIAGNOSIS — G629 Polyneuropathy, unspecified: Secondary | ICD-10-CM | POA: Diagnosis not present

## 2015-06-23 DIAGNOSIS — G20 Parkinson's disease: Secondary | ICD-10-CM | POA: Diagnosis not present

## 2015-06-23 DIAGNOSIS — M2560 Stiffness of unspecified joint, not elsewhere classified: Secondary | ICD-10-CM | POA: Diagnosis not present

## 2015-06-25 DIAGNOSIS — G629 Polyneuropathy, unspecified: Secondary | ICD-10-CM | POA: Diagnosis not present

## 2015-06-25 DIAGNOSIS — G20 Parkinson's disease: Secondary | ICD-10-CM | POA: Diagnosis not present

## 2015-06-25 DIAGNOSIS — M6281 Muscle weakness (generalized): Secondary | ICD-10-CM | POA: Diagnosis not present

## 2015-06-25 DIAGNOSIS — Z9181 History of falling: Secondary | ICD-10-CM | POA: Diagnosis not present

## 2015-06-25 DIAGNOSIS — R2689 Other abnormalities of gait and mobility: Secondary | ICD-10-CM | POA: Diagnosis not present

## 2015-06-25 DIAGNOSIS — M2560 Stiffness of unspecified joint, not elsewhere classified: Secondary | ICD-10-CM | POA: Diagnosis not present

## 2015-06-28 DIAGNOSIS — M2560 Stiffness of unspecified joint, not elsewhere classified: Secondary | ICD-10-CM | POA: Diagnosis not present

## 2015-06-28 DIAGNOSIS — R2689 Other abnormalities of gait and mobility: Secondary | ICD-10-CM | POA: Diagnosis not present

## 2015-06-28 DIAGNOSIS — G20 Parkinson's disease: Secondary | ICD-10-CM | POA: Diagnosis not present

## 2015-06-28 DIAGNOSIS — M6281 Muscle weakness (generalized): Secondary | ICD-10-CM | POA: Diagnosis not present

## 2015-06-28 DIAGNOSIS — Z9181 History of falling: Secondary | ICD-10-CM | POA: Diagnosis not present

## 2015-06-28 DIAGNOSIS — G629 Polyneuropathy, unspecified: Secondary | ICD-10-CM | POA: Diagnosis not present

## 2015-06-29 DIAGNOSIS — M6281 Muscle weakness (generalized): Secondary | ICD-10-CM | POA: Diagnosis not present

## 2015-06-29 DIAGNOSIS — R2689 Other abnormalities of gait and mobility: Secondary | ICD-10-CM | POA: Diagnosis not present

## 2015-06-29 DIAGNOSIS — G20 Parkinson's disease: Secondary | ICD-10-CM | POA: Diagnosis not present

## 2015-06-29 DIAGNOSIS — G629 Polyneuropathy, unspecified: Secondary | ICD-10-CM | POA: Diagnosis not present

## 2015-06-29 DIAGNOSIS — M2560 Stiffness of unspecified joint, not elsewhere classified: Secondary | ICD-10-CM | POA: Diagnosis not present

## 2015-06-29 DIAGNOSIS — Z9181 History of falling: Secondary | ICD-10-CM | POA: Diagnosis not present

## 2015-06-30 DIAGNOSIS — M2560 Stiffness of unspecified joint, not elsewhere classified: Secondary | ICD-10-CM | POA: Diagnosis not present

## 2015-06-30 DIAGNOSIS — G629 Polyneuropathy, unspecified: Secondary | ICD-10-CM | POA: Diagnosis not present

## 2015-06-30 DIAGNOSIS — Z9181 History of falling: Secondary | ICD-10-CM | POA: Diagnosis not present

## 2015-06-30 DIAGNOSIS — G20 Parkinson's disease: Secondary | ICD-10-CM | POA: Diagnosis not present

## 2015-06-30 DIAGNOSIS — R2689 Other abnormalities of gait and mobility: Secondary | ICD-10-CM | POA: Diagnosis not present

## 2015-06-30 DIAGNOSIS — M6281 Muscle weakness (generalized): Secondary | ICD-10-CM | POA: Diagnosis not present

## 2015-07-01 DIAGNOSIS — Z9181 History of falling: Secondary | ICD-10-CM | POA: Diagnosis not present

## 2015-07-01 DIAGNOSIS — M6281 Muscle weakness (generalized): Secondary | ICD-10-CM | POA: Diagnosis not present

## 2015-07-01 DIAGNOSIS — M2560 Stiffness of unspecified joint, not elsewhere classified: Secondary | ICD-10-CM | POA: Diagnosis not present

## 2015-07-01 DIAGNOSIS — R2689 Other abnormalities of gait and mobility: Secondary | ICD-10-CM | POA: Diagnosis not present

## 2015-07-01 DIAGNOSIS — G20 Parkinson's disease: Secondary | ICD-10-CM | POA: Diagnosis not present

## 2015-07-01 DIAGNOSIS — G629 Polyneuropathy, unspecified: Secondary | ICD-10-CM | POA: Diagnosis not present

## 2015-07-03 DIAGNOSIS — G629 Polyneuropathy, unspecified: Secondary | ICD-10-CM | POA: Diagnosis not present

## 2015-07-03 DIAGNOSIS — G20 Parkinson's disease: Secondary | ICD-10-CM | POA: Diagnosis not present

## 2015-07-03 DIAGNOSIS — R2689 Other abnormalities of gait and mobility: Secondary | ICD-10-CM | POA: Diagnosis not present

## 2015-07-03 DIAGNOSIS — M2560 Stiffness of unspecified joint, not elsewhere classified: Secondary | ICD-10-CM | POA: Diagnosis not present

## 2015-07-03 DIAGNOSIS — Z9181 History of falling: Secondary | ICD-10-CM | POA: Diagnosis not present

## 2015-07-03 DIAGNOSIS — M6281 Muscle weakness (generalized): Secondary | ICD-10-CM | POA: Diagnosis not present

## 2015-07-05 DIAGNOSIS — R2689 Other abnormalities of gait and mobility: Secondary | ICD-10-CM | POA: Diagnosis not present

## 2015-07-05 DIAGNOSIS — M2560 Stiffness of unspecified joint, not elsewhere classified: Secondary | ICD-10-CM | POA: Diagnosis not present

## 2015-07-05 DIAGNOSIS — G20 Parkinson's disease: Secondary | ICD-10-CM | POA: Diagnosis not present

## 2015-07-05 DIAGNOSIS — G629 Polyneuropathy, unspecified: Secondary | ICD-10-CM | POA: Diagnosis not present

## 2015-07-05 DIAGNOSIS — M6281 Muscle weakness (generalized): Secondary | ICD-10-CM | POA: Diagnosis not present

## 2015-07-05 DIAGNOSIS — Z9181 History of falling: Secondary | ICD-10-CM | POA: Diagnosis not present

## 2015-07-06 DIAGNOSIS — M2560 Stiffness of unspecified joint, not elsewhere classified: Secondary | ICD-10-CM | POA: Diagnosis not present

## 2015-07-06 DIAGNOSIS — M6281 Muscle weakness (generalized): Secondary | ICD-10-CM | POA: Diagnosis not present

## 2015-07-06 DIAGNOSIS — Z9181 History of falling: Secondary | ICD-10-CM | POA: Diagnosis not present

## 2015-07-06 DIAGNOSIS — R2689 Other abnormalities of gait and mobility: Secondary | ICD-10-CM | POA: Diagnosis not present

## 2015-07-06 DIAGNOSIS — G629 Polyneuropathy, unspecified: Secondary | ICD-10-CM | POA: Diagnosis not present

## 2015-07-06 DIAGNOSIS — G20 Parkinson's disease: Secondary | ICD-10-CM | POA: Diagnosis not present

## 2015-07-07 DIAGNOSIS — R2689 Other abnormalities of gait and mobility: Secondary | ICD-10-CM | POA: Diagnosis not present

## 2015-07-07 DIAGNOSIS — Z9181 History of falling: Secondary | ICD-10-CM | POA: Diagnosis not present

## 2015-07-07 DIAGNOSIS — M6281 Muscle weakness (generalized): Secondary | ICD-10-CM | POA: Diagnosis not present

## 2015-07-07 DIAGNOSIS — M2560 Stiffness of unspecified joint, not elsewhere classified: Secondary | ICD-10-CM | POA: Diagnosis not present

## 2015-07-07 DIAGNOSIS — G629 Polyneuropathy, unspecified: Secondary | ICD-10-CM | POA: Diagnosis not present

## 2015-07-07 DIAGNOSIS — G20 Parkinson's disease: Secondary | ICD-10-CM | POA: Diagnosis not present

## 2015-07-08 DIAGNOSIS — M6281 Muscle weakness (generalized): Secondary | ICD-10-CM | POA: Diagnosis not present

## 2015-07-08 DIAGNOSIS — M2560 Stiffness of unspecified joint, not elsewhere classified: Secondary | ICD-10-CM | POA: Diagnosis not present

## 2015-07-08 DIAGNOSIS — G20 Parkinson's disease: Secondary | ICD-10-CM | POA: Diagnosis not present

## 2015-07-08 DIAGNOSIS — Z9181 History of falling: Secondary | ICD-10-CM | POA: Diagnosis not present

## 2015-07-08 DIAGNOSIS — G629 Polyneuropathy, unspecified: Secondary | ICD-10-CM | POA: Diagnosis not present

## 2015-07-08 DIAGNOSIS — R2689 Other abnormalities of gait and mobility: Secondary | ICD-10-CM | POA: Diagnosis not present

## 2015-07-13 DIAGNOSIS — M6281 Muscle weakness (generalized): Secondary | ICD-10-CM | POA: Diagnosis not present

## 2015-07-13 DIAGNOSIS — R2689 Other abnormalities of gait and mobility: Secondary | ICD-10-CM | POA: Diagnosis not present

## 2015-07-13 DIAGNOSIS — M2560 Stiffness of unspecified joint, not elsewhere classified: Secondary | ICD-10-CM | POA: Diagnosis not present

## 2015-07-13 DIAGNOSIS — G20 Parkinson's disease: Secondary | ICD-10-CM | POA: Diagnosis not present

## 2015-07-13 DIAGNOSIS — G629 Polyneuropathy, unspecified: Secondary | ICD-10-CM | POA: Diagnosis not present

## 2015-07-13 DIAGNOSIS — Z9181 History of falling: Secondary | ICD-10-CM | POA: Diagnosis not present

## 2015-07-15 DIAGNOSIS — M2560 Stiffness of unspecified joint, not elsewhere classified: Secondary | ICD-10-CM | POA: Diagnosis not present

## 2015-07-15 DIAGNOSIS — G20 Parkinson's disease: Secondary | ICD-10-CM | POA: Diagnosis not present

## 2015-07-15 DIAGNOSIS — R2689 Other abnormalities of gait and mobility: Secondary | ICD-10-CM | POA: Diagnosis not present

## 2015-07-15 DIAGNOSIS — G629 Polyneuropathy, unspecified: Secondary | ICD-10-CM | POA: Diagnosis not present

## 2015-07-15 DIAGNOSIS — M6281 Muscle weakness (generalized): Secondary | ICD-10-CM | POA: Diagnosis not present

## 2015-07-15 DIAGNOSIS — Z9181 History of falling: Secondary | ICD-10-CM | POA: Diagnosis not present

## 2015-07-20 DIAGNOSIS — G629 Polyneuropathy, unspecified: Secondary | ICD-10-CM | POA: Diagnosis not present

## 2015-07-20 DIAGNOSIS — M2560 Stiffness of unspecified joint, not elsewhere classified: Secondary | ICD-10-CM | POA: Diagnosis not present

## 2015-07-20 DIAGNOSIS — M6281 Muscle weakness (generalized): Secondary | ICD-10-CM | POA: Diagnosis not present

## 2015-07-20 DIAGNOSIS — G20 Parkinson's disease: Secondary | ICD-10-CM | POA: Diagnosis not present

## 2015-07-20 DIAGNOSIS — Z9181 History of falling: Secondary | ICD-10-CM | POA: Diagnosis not present

## 2015-07-20 DIAGNOSIS — R2689 Other abnormalities of gait and mobility: Secondary | ICD-10-CM | POA: Diagnosis not present

## 2015-07-22 DIAGNOSIS — G629 Polyneuropathy, unspecified: Secondary | ICD-10-CM | POA: Diagnosis not present

## 2015-07-22 DIAGNOSIS — M2560 Stiffness of unspecified joint, not elsewhere classified: Secondary | ICD-10-CM | POA: Diagnosis not present

## 2015-07-22 DIAGNOSIS — R2689 Other abnormalities of gait and mobility: Secondary | ICD-10-CM | POA: Diagnosis not present

## 2015-07-22 DIAGNOSIS — Z9181 History of falling: Secondary | ICD-10-CM | POA: Diagnosis not present

## 2015-07-22 DIAGNOSIS — G20 Parkinson's disease: Secondary | ICD-10-CM | POA: Diagnosis not present

## 2015-07-22 DIAGNOSIS — M6281 Muscle weakness (generalized): Secondary | ICD-10-CM | POA: Diagnosis not present

## 2015-07-24 DIAGNOSIS — G629 Polyneuropathy, unspecified: Secondary | ICD-10-CM | POA: Diagnosis not present

## 2015-07-24 DIAGNOSIS — Z9181 History of falling: Secondary | ICD-10-CM | POA: Diagnosis not present

## 2015-07-24 DIAGNOSIS — G20 Parkinson's disease: Secondary | ICD-10-CM | POA: Diagnosis not present

## 2015-07-24 DIAGNOSIS — R2689 Other abnormalities of gait and mobility: Secondary | ICD-10-CM | POA: Diagnosis not present

## 2015-07-24 DIAGNOSIS — M6281 Muscle weakness (generalized): Secondary | ICD-10-CM | POA: Diagnosis not present

## 2015-07-24 DIAGNOSIS — M2560 Stiffness of unspecified joint, not elsewhere classified: Secondary | ICD-10-CM | POA: Diagnosis not present

## 2015-07-26 DIAGNOSIS — G629 Polyneuropathy, unspecified: Secondary | ICD-10-CM | POA: Diagnosis not present

## 2015-07-26 DIAGNOSIS — M6281 Muscle weakness (generalized): Secondary | ICD-10-CM | POA: Diagnosis not present

## 2015-07-26 DIAGNOSIS — Z9181 History of falling: Secondary | ICD-10-CM | POA: Diagnosis not present

## 2015-07-26 DIAGNOSIS — M2560 Stiffness of unspecified joint, not elsewhere classified: Secondary | ICD-10-CM | POA: Diagnosis not present

## 2015-07-26 DIAGNOSIS — R2689 Other abnormalities of gait and mobility: Secondary | ICD-10-CM | POA: Diagnosis not present

## 2015-07-26 DIAGNOSIS — G20 Parkinson's disease: Secondary | ICD-10-CM | POA: Diagnosis not present

## 2015-07-27 DIAGNOSIS — M2560 Stiffness of unspecified joint, not elsewhere classified: Secondary | ICD-10-CM | POA: Diagnosis not present

## 2015-07-27 DIAGNOSIS — G629 Polyneuropathy, unspecified: Secondary | ICD-10-CM | POA: Diagnosis not present

## 2015-07-27 DIAGNOSIS — M6281 Muscle weakness (generalized): Secondary | ICD-10-CM | POA: Diagnosis not present

## 2015-07-27 DIAGNOSIS — Z9181 History of falling: Secondary | ICD-10-CM | POA: Diagnosis not present

## 2015-07-27 DIAGNOSIS — G20 Parkinson's disease: Secondary | ICD-10-CM | POA: Diagnosis not present

## 2015-07-27 DIAGNOSIS — R2689 Other abnormalities of gait and mobility: Secondary | ICD-10-CM | POA: Diagnosis not present

## 2015-07-29 DIAGNOSIS — G629 Polyneuropathy, unspecified: Secondary | ICD-10-CM | POA: Diagnosis not present

## 2015-07-29 DIAGNOSIS — G20 Parkinson's disease: Secondary | ICD-10-CM | POA: Diagnosis not present

## 2015-07-29 DIAGNOSIS — R2689 Other abnormalities of gait and mobility: Secondary | ICD-10-CM | POA: Diagnosis not present

## 2015-07-29 DIAGNOSIS — M6281 Muscle weakness (generalized): Secondary | ICD-10-CM | POA: Diagnosis not present

## 2015-07-29 DIAGNOSIS — M2560 Stiffness of unspecified joint, not elsewhere classified: Secondary | ICD-10-CM | POA: Diagnosis not present

## 2015-07-29 DIAGNOSIS — Z9181 History of falling: Secondary | ICD-10-CM | POA: Diagnosis not present

## 2015-07-31 DIAGNOSIS — M6281 Muscle weakness (generalized): Secondary | ICD-10-CM | POA: Diagnosis not present

## 2015-07-31 DIAGNOSIS — M2560 Stiffness of unspecified joint, not elsewhere classified: Secondary | ICD-10-CM | POA: Diagnosis not present

## 2015-07-31 DIAGNOSIS — G629 Polyneuropathy, unspecified: Secondary | ICD-10-CM | POA: Diagnosis not present

## 2015-07-31 DIAGNOSIS — R2689 Other abnormalities of gait and mobility: Secondary | ICD-10-CM | POA: Diagnosis not present

## 2015-07-31 DIAGNOSIS — G20 Parkinson's disease: Secondary | ICD-10-CM | POA: Diagnosis not present

## 2015-07-31 DIAGNOSIS — Z9181 History of falling: Secondary | ICD-10-CM | POA: Diagnosis not present

## 2015-08-03 DIAGNOSIS — M2560 Stiffness of unspecified joint, not elsewhere classified: Secondary | ICD-10-CM | POA: Diagnosis not present

## 2015-08-03 DIAGNOSIS — G20 Parkinson's disease: Secondary | ICD-10-CM | POA: Diagnosis not present

## 2015-08-03 DIAGNOSIS — M6281 Muscle weakness (generalized): Secondary | ICD-10-CM | POA: Diagnosis not present

## 2015-08-03 DIAGNOSIS — G629 Polyneuropathy, unspecified: Secondary | ICD-10-CM | POA: Diagnosis not present

## 2015-08-03 DIAGNOSIS — R2689 Other abnormalities of gait and mobility: Secondary | ICD-10-CM | POA: Diagnosis not present

## 2015-08-03 DIAGNOSIS — Z9181 History of falling: Secondary | ICD-10-CM | POA: Diagnosis not present

## 2015-08-05 DIAGNOSIS — G629 Polyneuropathy, unspecified: Secondary | ICD-10-CM | POA: Diagnosis not present

## 2015-08-05 DIAGNOSIS — R2689 Other abnormalities of gait and mobility: Secondary | ICD-10-CM | POA: Diagnosis not present

## 2015-08-05 DIAGNOSIS — G20 Parkinson's disease: Secondary | ICD-10-CM | POA: Diagnosis not present

## 2015-08-05 DIAGNOSIS — Z9181 History of falling: Secondary | ICD-10-CM | POA: Diagnosis not present

## 2015-08-05 DIAGNOSIS — M2560 Stiffness of unspecified joint, not elsewhere classified: Secondary | ICD-10-CM | POA: Diagnosis not present

## 2015-08-05 DIAGNOSIS — M6281 Muscle weakness (generalized): Secondary | ICD-10-CM | POA: Diagnosis not present

## 2015-08-06 DIAGNOSIS — M2560 Stiffness of unspecified joint, not elsewhere classified: Secondary | ICD-10-CM | POA: Diagnosis not present

## 2015-08-06 DIAGNOSIS — M6281 Muscle weakness (generalized): Secondary | ICD-10-CM | POA: Diagnosis not present

## 2015-08-06 DIAGNOSIS — G20 Parkinson's disease: Secondary | ICD-10-CM | POA: Diagnosis not present

## 2015-08-06 DIAGNOSIS — G629 Polyneuropathy, unspecified: Secondary | ICD-10-CM | POA: Diagnosis not present

## 2015-08-06 DIAGNOSIS — R2689 Other abnormalities of gait and mobility: Secondary | ICD-10-CM | POA: Diagnosis not present

## 2015-08-06 DIAGNOSIS — Z9181 History of falling: Secondary | ICD-10-CM | POA: Diagnosis not present

## 2015-08-10 DIAGNOSIS — G20 Parkinson's disease: Secondary | ICD-10-CM | POA: Diagnosis not present

## 2015-08-10 DIAGNOSIS — G629 Polyneuropathy, unspecified: Secondary | ICD-10-CM | POA: Diagnosis not present

## 2015-08-10 DIAGNOSIS — R2689 Other abnormalities of gait and mobility: Secondary | ICD-10-CM | POA: Diagnosis not present

## 2015-08-10 DIAGNOSIS — Z9181 History of falling: Secondary | ICD-10-CM | POA: Diagnosis not present

## 2015-08-10 DIAGNOSIS — M6281 Muscle weakness (generalized): Secondary | ICD-10-CM | POA: Diagnosis not present

## 2015-08-10 DIAGNOSIS — M2560 Stiffness of unspecified joint, not elsewhere classified: Secondary | ICD-10-CM | POA: Diagnosis not present

## 2015-08-11 DIAGNOSIS — Z9181 History of falling: Secondary | ICD-10-CM | POA: Diagnosis not present

## 2015-08-11 DIAGNOSIS — M6281 Muscle weakness (generalized): Secondary | ICD-10-CM | POA: Diagnosis not present

## 2015-08-11 DIAGNOSIS — M2560 Stiffness of unspecified joint, not elsewhere classified: Secondary | ICD-10-CM | POA: Diagnosis not present

## 2015-08-11 DIAGNOSIS — R2689 Other abnormalities of gait and mobility: Secondary | ICD-10-CM | POA: Diagnosis not present

## 2015-08-11 DIAGNOSIS — G20 Parkinson's disease: Secondary | ICD-10-CM | POA: Diagnosis not present

## 2015-08-11 DIAGNOSIS — G629 Polyneuropathy, unspecified: Secondary | ICD-10-CM | POA: Diagnosis not present

## 2015-08-13 DIAGNOSIS — M6281 Muscle weakness (generalized): Secondary | ICD-10-CM | POA: Diagnosis not present

## 2015-08-13 DIAGNOSIS — G20 Parkinson's disease: Secondary | ICD-10-CM | POA: Diagnosis not present

## 2015-08-13 DIAGNOSIS — Z9181 History of falling: Secondary | ICD-10-CM | POA: Diagnosis not present

## 2015-08-13 DIAGNOSIS — M2560 Stiffness of unspecified joint, not elsewhere classified: Secondary | ICD-10-CM | POA: Diagnosis not present

## 2015-08-13 DIAGNOSIS — R2689 Other abnormalities of gait and mobility: Secondary | ICD-10-CM | POA: Diagnosis not present

## 2015-08-13 DIAGNOSIS — G629 Polyneuropathy, unspecified: Secondary | ICD-10-CM | POA: Diagnosis not present

## 2015-08-17 DIAGNOSIS — G629 Polyneuropathy, unspecified: Secondary | ICD-10-CM | POA: Diagnosis not present

## 2015-08-17 DIAGNOSIS — R2689 Other abnormalities of gait and mobility: Secondary | ICD-10-CM | POA: Diagnosis not present

## 2015-08-17 DIAGNOSIS — G20 Parkinson's disease: Secondary | ICD-10-CM | POA: Diagnosis not present

## 2015-08-17 DIAGNOSIS — M2560 Stiffness of unspecified joint, not elsewhere classified: Secondary | ICD-10-CM | POA: Diagnosis not present

## 2015-08-17 DIAGNOSIS — M6281 Muscle weakness (generalized): Secondary | ICD-10-CM | POA: Diagnosis not present

## 2015-08-17 DIAGNOSIS — Z9181 History of falling: Secondary | ICD-10-CM | POA: Diagnosis not present

## 2015-08-18 DIAGNOSIS — L89322 Pressure ulcer of left buttock, stage 2: Secondary | ICD-10-CM | POA: Diagnosis not present

## 2015-08-18 DIAGNOSIS — M542 Cervicalgia: Secondary | ICD-10-CM | POA: Diagnosis not present

## 2015-08-18 DIAGNOSIS — G20 Parkinson's disease: Secondary | ICD-10-CM | POA: Diagnosis not present

## 2015-08-18 DIAGNOSIS — Z9181 History of falling: Secondary | ICD-10-CM | POA: Diagnosis not present

## 2015-08-18 DIAGNOSIS — G629 Polyneuropathy, unspecified: Secondary | ICD-10-CM | POA: Diagnosis not present

## 2015-08-18 DIAGNOSIS — M6281 Muscle weakness (generalized): Secondary | ICD-10-CM | POA: Diagnosis not present

## 2015-08-19 DIAGNOSIS — G20 Parkinson's disease: Secondary | ICD-10-CM | POA: Diagnosis not present

## 2015-08-19 DIAGNOSIS — G629 Polyneuropathy, unspecified: Secondary | ICD-10-CM | POA: Diagnosis not present

## 2015-08-19 DIAGNOSIS — L89322 Pressure ulcer of left buttock, stage 2: Secondary | ICD-10-CM | POA: Diagnosis not present

## 2015-08-19 DIAGNOSIS — M542 Cervicalgia: Secondary | ICD-10-CM | POA: Diagnosis not present

## 2015-08-19 DIAGNOSIS — Z9181 History of falling: Secondary | ICD-10-CM | POA: Diagnosis not present

## 2015-08-19 DIAGNOSIS — M6281 Muscle weakness (generalized): Secondary | ICD-10-CM | POA: Diagnosis not present

## 2015-08-24 DIAGNOSIS — M6281 Muscle weakness (generalized): Secondary | ICD-10-CM | POA: Diagnosis not present

## 2015-08-24 DIAGNOSIS — M542 Cervicalgia: Secondary | ICD-10-CM | POA: Diagnosis not present

## 2015-08-24 DIAGNOSIS — G20 Parkinson's disease: Secondary | ICD-10-CM | POA: Diagnosis not present

## 2015-08-24 DIAGNOSIS — Z9181 History of falling: Secondary | ICD-10-CM | POA: Diagnosis not present

## 2015-08-24 DIAGNOSIS — G629 Polyneuropathy, unspecified: Secondary | ICD-10-CM | POA: Diagnosis not present

## 2015-08-24 DIAGNOSIS — L89322 Pressure ulcer of left buttock, stage 2: Secondary | ICD-10-CM | POA: Diagnosis not present

## 2015-08-26 DIAGNOSIS — G629 Polyneuropathy, unspecified: Secondary | ICD-10-CM | POA: Diagnosis not present

## 2015-08-26 DIAGNOSIS — M6281 Muscle weakness (generalized): Secondary | ICD-10-CM | POA: Diagnosis not present

## 2015-08-26 DIAGNOSIS — M542 Cervicalgia: Secondary | ICD-10-CM | POA: Diagnosis not present

## 2015-08-26 DIAGNOSIS — G20 Parkinson's disease: Secondary | ICD-10-CM | POA: Diagnosis not present

## 2015-08-26 DIAGNOSIS — Z9181 History of falling: Secondary | ICD-10-CM | POA: Diagnosis not present

## 2015-08-26 DIAGNOSIS — L89322 Pressure ulcer of left buttock, stage 2: Secondary | ICD-10-CM | POA: Diagnosis not present

## 2015-08-28 DIAGNOSIS — Z9181 History of falling: Secondary | ICD-10-CM | POA: Diagnosis not present

## 2015-08-28 DIAGNOSIS — G629 Polyneuropathy, unspecified: Secondary | ICD-10-CM | POA: Diagnosis not present

## 2015-08-28 DIAGNOSIS — G20 Parkinson's disease: Secondary | ICD-10-CM | POA: Diagnosis not present

## 2015-08-28 DIAGNOSIS — M542 Cervicalgia: Secondary | ICD-10-CM | POA: Diagnosis not present

## 2015-08-28 DIAGNOSIS — M6281 Muscle weakness (generalized): Secondary | ICD-10-CM | POA: Diagnosis not present

## 2015-08-28 DIAGNOSIS — L89322 Pressure ulcer of left buttock, stage 2: Secondary | ICD-10-CM | POA: Diagnosis not present

## 2015-08-30 DIAGNOSIS — M542 Cervicalgia: Secondary | ICD-10-CM | POA: Diagnosis not present

## 2015-08-30 DIAGNOSIS — Z9181 History of falling: Secondary | ICD-10-CM | POA: Diagnosis not present

## 2015-08-30 DIAGNOSIS — G629 Polyneuropathy, unspecified: Secondary | ICD-10-CM | POA: Diagnosis not present

## 2015-08-30 DIAGNOSIS — L89322 Pressure ulcer of left buttock, stage 2: Secondary | ICD-10-CM | POA: Diagnosis not present

## 2015-08-30 DIAGNOSIS — G20 Parkinson's disease: Secondary | ICD-10-CM | POA: Diagnosis not present

## 2015-08-30 DIAGNOSIS — M6281 Muscle weakness (generalized): Secondary | ICD-10-CM | POA: Diagnosis not present

## 2015-08-31 DIAGNOSIS — G20 Parkinson's disease: Secondary | ICD-10-CM | POA: Diagnosis not present

## 2015-08-31 DIAGNOSIS — M6281 Muscle weakness (generalized): Secondary | ICD-10-CM | POA: Diagnosis not present

## 2015-08-31 DIAGNOSIS — L89322 Pressure ulcer of left buttock, stage 2: Secondary | ICD-10-CM | POA: Diagnosis not present

## 2015-08-31 DIAGNOSIS — Z9181 History of falling: Secondary | ICD-10-CM | POA: Diagnosis not present

## 2015-08-31 DIAGNOSIS — M542 Cervicalgia: Secondary | ICD-10-CM | POA: Diagnosis not present

## 2015-08-31 DIAGNOSIS — G629 Polyneuropathy, unspecified: Secondary | ICD-10-CM | POA: Diagnosis not present

## 2015-09-01 DIAGNOSIS — G20 Parkinson's disease: Secondary | ICD-10-CM | POA: Diagnosis not present

## 2015-09-01 DIAGNOSIS — M6281 Muscle weakness (generalized): Secondary | ICD-10-CM | POA: Diagnosis not present

## 2015-09-01 DIAGNOSIS — G629 Polyneuropathy, unspecified: Secondary | ICD-10-CM | POA: Diagnosis not present

## 2015-09-01 DIAGNOSIS — Z9181 History of falling: Secondary | ICD-10-CM | POA: Diagnosis not present

## 2015-09-01 DIAGNOSIS — L89322 Pressure ulcer of left buttock, stage 2: Secondary | ICD-10-CM | POA: Diagnosis not present

## 2015-09-01 DIAGNOSIS — M542 Cervicalgia: Secondary | ICD-10-CM | POA: Diagnosis not present

## 2015-09-02 DIAGNOSIS — M542 Cervicalgia: Secondary | ICD-10-CM | POA: Diagnosis not present

## 2015-09-02 DIAGNOSIS — G629 Polyneuropathy, unspecified: Secondary | ICD-10-CM | POA: Diagnosis not present

## 2015-09-02 DIAGNOSIS — G20 Parkinson's disease: Secondary | ICD-10-CM | POA: Diagnosis not present

## 2015-09-02 DIAGNOSIS — M6281 Muscle weakness (generalized): Secondary | ICD-10-CM | POA: Diagnosis not present

## 2015-09-02 DIAGNOSIS — Z9181 History of falling: Secondary | ICD-10-CM | POA: Diagnosis not present

## 2015-09-02 DIAGNOSIS — L89322 Pressure ulcer of left buttock, stage 2: Secondary | ICD-10-CM | POA: Diagnosis not present

## 2015-09-03 DIAGNOSIS — G20 Parkinson's disease: Secondary | ICD-10-CM | POA: Diagnosis not present

## 2015-09-03 DIAGNOSIS — M6281 Muscle weakness (generalized): Secondary | ICD-10-CM | POA: Diagnosis not present

## 2015-09-03 DIAGNOSIS — G629 Polyneuropathy, unspecified: Secondary | ICD-10-CM | POA: Diagnosis not present

## 2015-09-03 DIAGNOSIS — Z9181 History of falling: Secondary | ICD-10-CM | POA: Diagnosis not present

## 2015-09-03 DIAGNOSIS — L89322 Pressure ulcer of left buttock, stage 2: Secondary | ICD-10-CM | POA: Diagnosis not present

## 2015-09-03 DIAGNOSIS — M542 Cervicalgia: Secondary | ICD-10-CM | POA: Diagnosis not present

## 2015-09-06 DIAGNOSIS — L89322 Pressure ulcer of left buttock, stage 2: Secondary | ICD-10-CM | POA: Diagnosis not present

## 2015-09-06 DIAGNOSIS — G629 Polyneuropathy, unspecified: Secondary | ICD-10-CM | POA: Diagnosis not present

## 2015-09-06 DIAGNOSIS — M6281 Muscle weakness (generalized): Secondary | ICD-10-CM | POA: Diagnosis not present

## 2015-09-06 DIAGNOSIS — Z9181 History of falling: Secondary | ICD-10-CM | POA: Diagnosis not present

## 2015-09-06 DIAGNOSIS — M542 Cervicalgia: Secondary | ICD-10-CM | POA: Diagnosis not present

## 2015-09-06 DIAGNOSIS — G20 Parkinson's disease: Secondary | ICD-10-CM | POA: Diagnosis not present

## 2015-09-07 DIAGNOSIS — Z9181 History of falling: Secondary | ICD-10-CM | POA: Diagnosis not present

## 2015-09-07 DIAGNOSIS — L89322 Pressure ulcer of left buttock, stage 2: Secondary | ICD-10-CM | POA: Diagnosis not present

## 2015-09-07 DIAGNOSIS — G20 Parkinson's disease: Secondary | ICD-10-CM | POA: Diagnosis not present

## 2015-09-07 DIAGNOSIS — G629 Polyneuropathy, unspecified: Secondary | ICD-10-CM | POA: Diagnosis not present

## 2015-09-07 DIAGNOSIS — M6281 Muscle weakness (generalized): Secondary | ICD-10-CM | POA: Diagnosis not present

## 2015-09-07 DIAGNOSIS — M542 Cervicalgia: Secondary | ICD-10-CM | POA: Diagnosis not present

## 2015-09-08 DIAGNOSIS — G20 Parkinson's disease: Secondary | ICD-10-CM | POA: Diagnosis not present

## 2015-09-08 DIAGNOSIS — M6281 Muscle weakness (generalized): Secondary | ICD-10-CM | POA: Diagnosis not present

## 2015-09-08 DIAGNOSIS — G629 Polyneuropathy, unspecified: Secondary | ICD-10-CM | POA: Diagnosis not present

## 2015-09-08 DIAGNOSIS — L89322 Pressure ulcer of left buttock, stage 2: Secondary | ICD-10-CM | POA: Diagnosis not present

## 2015-09-08 DIAGNOSIS — Z9181 History of falling: Secondary | ICD-10-CM | POA: Diagnosis not present

## 2015-09-08 DIAGNOSIS — M542 Cervicalgia: Secondary | ICD-10-CM | POA: Diagnosis not present

## 2015-09-09 DIAGNOSIS — Z9181 History of falling: Secondary | ICD-10-CM | POA: Diagnosis not present

## 2015-09-09 DIAGNOSIS — G629 Polyneuropathy, unspecified: Secondary | ICD-10-CM | POA: Diagnosis not present

## 2015-09-09 DIAGNOSIS — R300 Dysuria: Secondary | ICD-10-CM | POA: Diagnosis not present

## 2015-09-09 DIAGNOSIS — M542 Cervicalgia: Secondary | ICD-10-CM | POA: Diagnosis not present

## 2015-09-09 DIAGNOSIS — E559 Vitamin D deficiency, unspecified: Secondary | ICD-10-CM | POA: Diagnosis not present

## 2015-09-09 DIAGNOSIS — L89322 Pressure ulcer of left buttock, stage 2: Secondary | ICD-10-CM | POA: Diagnosis not present

## 2015-09-09 DIAGNOSIS — D510 Vitamin B12 deficiency anemia due to intrinsic factor deficiency: Secondary | ICD-10-CM | POA: Diagnosis not present

## 2015-09-09 DIAGNOSIS — I10 Essential (primary) hypertension: Secondary | ICD-10-CM | POA: Diagnosis not present

## 2015-09-09 DIAGNOSIS — G20 Parkinson's disease: Secondary | ICD-10-CM | POA: Diagnosis not present

## 2015-09-09 DIAGNOSIS — M6281 Muscle weakness (generalized): Secondary | ICD-10-CM | POA: Diagnosis not present

## 2015-09-10 DIAGNOSIS — Z9181 History of falling: Secondary | ICD-10-CM | POA: Insufficient documentation

## 2015-09-10 DIAGNOSIS — Z6828 Body mass index (BMI) 28.0-28.9, adult: Secondary | ICD-10-CM | POA: Diagnosis not present

## 2015-09-10 DIAGNOSIS — G609 Hereditary and idiopathic neuropathy, unspecified: Secondary | ICD-10-CM | POA: Diagnosis not present

## 2015-09-10 DIAGNOSIS — G20 Parkinson's disease: Secondary | ICD-10-CM | POA: Diagnosis not present

## 2015-09-11 DIAGNOSIS — M6281 Muscle weakness (generalized): Secondary | ICD-10-CM | POA: Diagnosis not present

## 2015-09-11 DIAGNOSIS — G20 Parkinson's disease: Secondary | ICD-10-CM | POA: Diagnosis not present

## 2015-09-11 DIAGNOSIS — Z9181 History of falling: Secondary | ICD-10-CM | POA: Diagnosis not present

## 2015-09-11 DIAGNOSIS — M542 Cervicalgia: Secondary | ICD-10-CM | POA: Diagnosis not present

## 2015-09-11 DIAGNOSIS — G629 Polyneuropathy, unspecified: Secondary | ICD-10-CM | POA: Diagnosis not present

## 2015-09-11 DIAGNOSIS — L89322 Pressure ulcer of left buttock, stage 2: Secondary | ICD-10-CM | POA: Diagnosis not present

## 2015-09-13 DIAGNOSIS — L89322 Pressure ulcer of left buttock, stage 2: Secondary | ICD-10-CM | POA: Diagnosis not present

## 2015-09-13 DIAGNOSIS — Z9181 History of falling: Secondary | ICD-10-CM | POA: Diagnosis not present

## 2015-09-13 DIAGNOSIS — G629 Polyneuropathy, unspecified: Secondary | ICD-10-CM | POA: Diagnosis not present

## 2015-09-13 DIAGNOSIS — M542 Cervicalgia: Secondary | ICD-10-CM | POA: Diagnosis not present

## 2015-09-13 DIAGNOSIS — G20 Parkinson's disease: Secondary | ICD-10-CM | POA: Diagnosis not present

## 2015-09-13 DIAGNOSIS — M6281 Muscle weakness (generalized): Secondary | ICD-10-CM | POA: Diagnosis not present

## 2015-09-15 DIAGNOSIS — M6281 Muscle weakness (generalized): Secondary | ICD-10-CM | POA: Diagnosis not present

## 2015-09-15 DIAGNOSIS — Z9181 History of falling: Secondary | ICD-10-CM | POA: Diagnosis not present

## 2015-09-15 DIAGNOSIS — L89322 Pressure ulcer of left buttock, stage 2: Secondary | ICD-10-CM | POA: Diagnosis not present

## 2015-09-15 DIAGNOSIS — G629 Polyneuropathy, unspecified: Secondary | ICD-10-CM | POA: Diagnosis not present

## 2015-09-15 DIAGNOSIS — M542 Cervicalgia: Secondary | ICD-10-CM | POA: Diagnosis not present

## 2015-09-15 DIAGNOSIS — G20 Parkinson's disease: Secondary | ICD-10-CM | POA: Diagnosis not present

## 2015-09-16 DIAGNOSIS — L89322 Pressure ulcer of left buttock, stage 2: Secondary | ICD-10-CM | POA: Diagnosis not present

## 2015-09-16 DIAGNOSIS — G20 Parkinson's disease: Secondary | ICD-10-CM | POA: Diagnosis not present

## 2015-09-16 DIAGNOSIS — M6281 Muscle weakness (generalized): Secondary | ICD-10-CM | POA: Diagnosis not present

## 2015-09-16 DIAGNOSIS — Z9181 History of falling: Secondary | ICD-10-CM | POA: Diagnosis not present

## 2015-09-16 DIAGNOSIS — M542 Cervicalgia: Secondary | ICD-10-CM | POA: Diagnosis not present

## 2015-09-16 DIAGNOSIS — G629 Polyneuropathy, unspecified: Secondary | ICD-10-CM | POA: Diagnosis not present

## 2015-09-17 ENCOUNTER — Telehealth: Payer: Self-pay | Admitting: Internal Medicine

## 2015-09-17 NOTE — Telephone Encounter (Signed)
Tiffany @ [facility] called she stated she took a urine same of pt and sent results to dr Royston Sinnerjoseph miller.  They would not write and rx for uti She wanted to know if you could see [facility] and would you send a rx for pt   Best number [phone]

## 2015-09-17 NOTE — Telephone Encounter (Signed)
Let her know that even if I could see the urinalysis, that I would need to do a clinical assessment to decide on Rx. If he is sick, he needs evaluation  I have no idea why he is on home health or why the test was done

## 2015-09-17 NOTE — Telephone Encounter (Signed)
Spoke to Campbell Soupiffany. She said Dr Hyacinth MeekerMiller (Neuro) has him on home PT and skilled nursing for wound care. I advised her he would have to see someone to be able to get a rx. She will let him know

## 2015-09-18 DIAGNOSIS — Z9181 History of falling: Secondary | ICD-10-CM | POA: Diagnosis not present

## 2015-09-18 DIAGNOSIS — L89322 Pressure ulcer of left buttock, stage 2: Secondary | ICD-10-CM | POA: Diagnosis not present

## 2015-09-18 DIAGNOSIS — M6281 Muscle weakness (generalized): Secondary | ICD-10-CM | POA: Diagnosis not present

## 2015-09-18 DIAGNOSIS — M542 Cervicalgia: Secondary | ICD-10-CM | POA: Diagnosis not present

## 2015-09-18 DIAGNOSIS — G20 Parkinson's disease: Secondary | ICD-10-CM | POA: Diagnosis not present

## 2015-09-18 DIAGNOSIS — G629 Polyneuropathy, unspecified: Secondary | ICD-10-CM | POA: Diagnosis not present

## 2015-09-21 DIAGNOSIS — M6281 Muscle weakness (generalized): Secondary | ICD-10-CM | POA: Diagnosis not present

## 2015-09-21 DIAGNOSIS — M542 Cervicalgia: Secondary | ICD-10-CM | POA: Diagnosis not present

## 2015-09-21 DIAGNOSIS — G20 Parkinson's disease: Secondary | ICD-10-CM | POA: Diagnosis not present

## 2015-09-21 DIAGNOSIS — L89322 Pressure ulcer of left buttock, stage 2: Secondary | ICD-10-CM | POA: Diagnosis not present

## 2015-09-21 DIAGNOSIS — Z9181 History of falling: Secondary | ICD-10-CM | POA: Diagnosis not present

## 2015-09-21 DIAGNOSIS — G629 Polyneuropathy, unspecified: Secondary | ICD-10-CM | POA: Diagnosis not present

## 2015-09-22 DIAGNOSIS — G20 Parkinson's disease: Secondary | ICD-10-CM | POA: Diagnosis not present

## 2015-09-22 DIAGNOSIS — M6281 Muscle weakness (generalized): Secondary | ICD-10-CM | POA: Diagnosis not present

## 2015-09-22 DIAGNOSIS — Z9181 History of falling: Secondary | ICD-10-CM | POA: Diagnosis not present

## 2015-09-22 DIAGNOSIS — M542 Cervicalgia: Secondary | ICD-10-CM | POA: Diagnosis not present

## 2015-09-22 DIAGNOSIS — G629 Polyneuropathy, unspecified: Secondary | ICD-10-CM | POA: Diagnosis not present

## 2015-09-22 DIAGNOSIS — L89322 Pressure ulcer of left buttock, stage 2: Secondary | ICD-10-CM | POA: Diagnosis not present

## 2015-09-23 DIAGNOSIS — G629 Polyneuropathy, unspecified: Secondary | ICD-10-CM | POA: Diagnosis not present

## 2015-09-23 DIAGNOSIS — M542 Cervicalgia: Secondary | ICD-10-CM | POA: Diagnosis not present

## 2015-09-23 DIAGNOSIS — L89322 Pressure ulcer of left buttock, stage 2: Secondary | ICD-10-CM | POA: Diagnosis not present

## 2015-09-23 DIAGNOSIS — G20 Parkinson's disease: Secondary | ICD-10-CM | POA: Diagnosis not present

## 2015-09-23 DIAGNOSIS — Z9181 History of falling: Secondary | ICD-10-CM | POA: Diagnosis not present

## 2015-09-23 DIAGNOSIS — M6281 Muscle weakness (generalized): Secondary | ICD-10-CM | POA: Diagnosis not present

## 2015-09-24 DIAGNOSIS — M6281 Muscle weakness (generalized): Secondary | ICD-10-CM | POA: Diagnosis not present

## 2015-09-24 DIAGNOSIS — G20 Parkinson's disease: Secondary | ICD-10-CM | POA: Diagnosis not present

## 2015-09-24 DIAGNOSIS — L89322 Pressure ulcer of left buttock, stage 2: Secondary | ICD-10-CM | POA: Diagnosis not present

## 2015-09-24 DIAGNOSIS — G629 Polyneuropathy, unspecified: Secondary | ICD-10-CM | POA: Diagnosis not present

## 2015-09-24 DIAGNOSIS — M542 Cervicalgia: Secondary | ICD-10-CM | POA: Diagnosis not present

## 2015-09-24 DIAGNOSIS — Z9181 History of falling: Secondary | ICD-10-CM | POA: Diagnosis not present

## 2015-09-25 DIAGNOSIS — Z9181 History of falling: Secondary | ICD-10-CM | POA: Diagnosis not present

## 2015-09-25 DIAGNOSIS — L89322 Pressure ulcer of left buttock, stage 2: Secondary | ICD-10-CM | POA: Diagnosis not present

## 2015-09-25 DIAGNOSIS — G20 Parkinson's disease: Secondary | ICD-10-CM | POA: Diagnosis not present

## 2015-09-25 DIAGNOSIS — M542 Cervicalgia: Secondary | ICD-10-CM | POA: Diagnosis not present

## 2015-09-25 DIAGNOSIS — G629 Polyneuropathy, unspecified: Secondary | ICD-10-CM | POA: Diagnosis not present

## 2015-09-25 DIAGNOSIS — M6281 Muscle weakness (generalized): Secondary | ICD-10-CM | POA: Diagnosis not present

## 2015-09-27 DIAGNOSIS — Z9181 History of falling: Secondary | ICD-10-CM | POA: Diagnosis not present

## 2015-09-27 DIAGNOSIS — M542 Cervicalgia: Secondary | ICD-10-CM | POA: Diagnosis not present

## 2015-09-27 DIAGNOSIS — G629 Polyneuropathy, unspecified: Secondary | ICD-10-CM | POA: Diagnosis not present

## 2015-09-27 DIAGNOSIS — M6281 Muscle weakness (generalized): Secondary | ICD-10-CM | POA: Diagnosis not present

## 2015-09-27 DIAGNOSIS — L89322 Pressure ulcer of left buttock, stage 2: Secondary | ICD-10-CM | POA: Diagnosis not present

## 2015-09-27 DIAGNOSIS — G20 Parkinson's disease: Secondary | ICD-10-CM | POA: Diagnosis not present

## 2015-09-28 DIAGNOSIS — M542 Cervicalgia: Secondary | ICD-10-CM | POA: Diagnosis not present

## 2015-09-28 DIAGNOSIS — Z9181 History of falling: Secondary | ICD-10-CM | POA: Diagnosis not present

## 2015-09-28 DIAGNOSIS — G20 Parkinson's disease: Secondary | ICD-10-CM | POA: Diagnosis not present

## 2015-09-28 DIAGNOSIS — L89322 Pressure ulcer of left buttock, stage 2: Secondary | ICD-10-CM | POA: Diagnosis not present

## 2015-09-28 DIAGNOSIS — M6281 Muscle weakness (generalized): Secondary | ICD-10-CM | POA: Diagnosis not present

## 2015-09-28 DIAGNOSIS — G629 Polyneuropathy, unspecified: Secondary | ICD-10-CM | POA: Diagnosis not present

## 2015-09-29 DIAGNOSIS — G20 Parkinson's disease: Secondary | ICD-10-CM | POA: Diagnosis not present

## 2015-09-29 DIAGNOSIS — G629 Polyneuropathy, unspecified: Secondary | ICD-10-CM | POA: Diagnosis not present

## 2015-09-29 DIAGNOSIS — L89322 Pressure ulcer of left buttock, stage 2: Secondary | ICD-10-CM | POA: Diagnosis not present

## 2015-09-29 DIAGNOSIS — Z9181 History of falling: Secondary | ICD-10-CM | POA: Diagnosis not present

## 2015-09-29 DIAGNOSIS — M6281 Muscle weakness (generalized): Secondary | ICD-10-CM | POA: Diagnosis not present

## 2015-09-29 DIAGNOSIS — M542 Cervicalgia: Secondary | ICD-10-CM | POA: Diagnosis not present

## 2015-09-30 DIAGNOSIS — M6281 Muscle weakness (generalized): Secondary | ICD-10-CM | POA: Diagnosis not present

## 2015-09-30 DIAGNOSIS — Z9181 History of falling: Secondary | ICD-10-CM | POA: Diagnosis not present

## 2015-09-30 DIAGNOSIS — M542 Cervicalgia: Secondary | ICD-10-CM | POA: Diagnosis not present

## 2015-09-30 DIAGNOSIS — G20 Parkinson's disease: Secondary | ICD-10-CM | POA: Diagnosis not present

## 2015-09-30 DIAGNOSIS — L89322 Pressure ulcer of left buttock, stage 2: Secondary | ICD-10-CM | POA: Diagnosis not present

## 2015-09-30 DIAGNOSIS — G629 Polyneuropathy, unspecified: Secondary | ICD-10-CM | POA: Diagnosis not present

## 2015-10-01 DIAGNOSIS — M542 Cervicalgia: Secondary | ICD-10-CM | POA: Diagnosis not present

## 2015-10-01 DIAGNOSIS — G629 Polyneuropathy, unspecified: Secondary | ICD-10-CM | POA: Diagnosis not present

## 2015-10-01 DIAGNOSIS — L89322 Pressure ulcer of left buttock, stage 2: Secondary | ICD-10-CM | POA: Diagnosis not present

## 2015-10-01 DIAGNOSIS — G20 Parkinson's disease: Secondary | ICD-10-CM | POA: Diagnosis not present

## 2015-10-01 DIAGNOSIS — Z9181 History of falling: Secondary | ICD-10-CM | POA: Diagnosis not present

## 2015-10-01 DIAGNOSIS — M6281 Muscle weakness (generalized): Secondary | ICD-10-CM | POA: Diagnosis not present

## 2015-10-05 DIAGNOSIS — G629 Polyneuropathy, unspecified: Secondary | ICD-10-CM | POA: Diagnosis not present

## 2015-10-05 DIAGNOSIS — G20 Parkinson's disease: Secondary | ICD-10-CM | POA: Diagnosis not present

## 2015-10-05 DIAGNOSIS — M542 Cervicalgia: Secondary | ICD-10-CM | POA: Diagnosis not present

## 2015-10-05 DIAGNOSIS — M6281 Muscle weakness (generalized): Secondary | ICD-10-CM | POA: Diagnosis not present

## 2015-10-05 DIAGNOSIS — L89322 Pressure ulcer of left buttock, stage 2: Secondary | ICD-10-CM | POA: Diagnosis not present

## 2015-10-05 DIAGNOSIS — Z9181 History of falling: Secondary | ICD-10-CM | POA: Diagnosis not present

## 2015-10-07 DIAGNOSIS — G20 Parkinson's disease: Secondary | ICD-10-CM | POA: Diagnosis not present

## 2015-10-07 DIAGNOSIS — G629 Polyneuropathy, unspecified: Secondary | ICD-10-CM | POA: Diagnosis not present

## 2015-10-07 DIAGNOSIS — Z9181 History of falling: Secondary | ICD-10-CM | POA: Diagnosis not present

## 2015-10-07 DIAGNOSIS — M542 Cervicalgia: Secondary | ICD-10-CM | POA: Diagnosis not present

## 2015-10-07 DIAGNOSIS — L89322 Pressure ulcer of left buttock, stage 2: Secondary | ICD-10-CM | POA: Diagnosis not present

## 2015-10-07 DIAGNOSIS — M6281 Muscle weakness (generalized): Secondary | ICD-10-CM | POA: Diagnosis not present

## 2015-10-08 DIAGNOSIS — M6281 Muscle weakness (generalized): Secondary | ICD-10-CM | POA: Diagnosis not present

## 2015-10-08 DIAGNOSIS — G629 Polyneuropathy, unspecified: Secondary | ICD-10-CM | POA: Diagnosis not present

## 2015-10-08 DIAGNOSIS — M542 Cervicalgia: Secondary | ICD-10-CM | POA: Diagnosis not present

## 2015-10-08 DIAGNOSIS — G20 Parkinson's disease: Secondary | ICD-10-CM | POA: Diagnosis not present

## 2015-10-08 DIAGNOSIS — L89322 Pressure ulcer of left buttock, stage 2: Secondary | ICD-10-CM | POA: Diagnosis not present

## 2015-10-08 DIAGNOSIS — Z9181 History of falling: Secondary | ICD-10-CM | POA: Diagnosis not present

## 2015-10-12 DIAGNOSIS — G629 Polyneuropathy, unspecified: Secondary | ICD-10-CM | POA: Diagnosis not present

## 2015-10-12 DIAGNOSIS — Z9181 History of falling: Secondary | ICD-10-CM | POA: Diagnosis not present

## 2015-10-12 DIAGNOSIS — M542 Cervicalgia: Secondary | ICD-10-CM | POA: Diagnosis not present

## 2015-10-12 DIAGNOSIS — G20 Parkinson's disease: Secondary | ICD-10-CM | POA: Diagnosis not present

## 2015-10-12 DIAGNOSIS — M6281 Muscle weakness (generalized): Secondary | ICD-10-CM | POA: Diagnosis not present

## 2015-10-12 DIAGNOSIS — L89322 Pressure ulcer of left buttock, stage 2: Secondary | ICD-10-CM | POA: Diagnosis not present

## 2015-10-14 DIAGNOSIS — M542 Cervicalgia: Secondary | ICD-10-CM | POA: Diagnosis not present

## 2015-10-14 DIAGNOSIS — G629 Polyneuropathy, unspecified: Secondary | ICD-10-CM | POA: Diagnosis not present

## 2015-10-14 DIAGNOSIS — L89322 Pressure ulcer of left buttock, stage 2: Secondary | ICD-10-CM | POA: Diagnosis not present

## 2015-10-14 DIAGNOSIS — M6281 Muscle weakness (generalized): Secondary | ICD-10-CM | POA: Diagnosis not present

## 2015-10-14 DIAGNOSIS — Z9181 History of falling: Secondary | ICD-10-CM | POA: Diagnosis not present

## 2015-10-14 DIAGNOSIS — G20 Parkinson's disease: Secondary | ICD-10-CM | POA: Diagnosis not present

## 2015-10-17 DIAGNOSIS — G20 Parkinson's disease: Secondary | ICD-10-CM | POA: Diagnosis not present

## 2015-10-17 DIAGNOSIS — R1310 Dysphagia, unspecified: Secondary | ICD-10-CM | POA: Diagnosis not present

## 2015-10-17 DIAGNOSIS — Z9181 History of falling: Secondary | ICD-10-CM | POA: Diagnosis not present

## 2015-10-17 DIAGNOSIS — G629 Polyneuropathy, unspecified: Secondary | ICD-10-CM | POA: Diagnosis not present

## 2015-10-18 DIAGNOSIS — G20 Parkinson's disease: Secondary | ICD-10-CM | POA: Diagnosis not present

## 2015-10-18 DIAGNOSIS — R1310 Dysphagia, unspecified: Secondary | ICD-10-CM | POA: Diagnosis not present

## 2015-10-18 DIAGNOSIS — Z9181 History of falling: Secondary | ICD-10-CM | POA: Diagnosis not present

## 2015-10-18 DIAGNOSIS — G629 Polyneuropathy, unspecified: Secondary | ICD-10-CM | POA: Diagnosis not present

## 2015-10-19 DIAGNOSIS — G20 Parkinson's disease: Secondary | ICD-10-CM | POA: Diagnosis not present

## 2015-10-19 DIAGNOSIS — Z9181 History of falling: Secondary | ICD-10-CM | POA: Diagnosis not present

## 2015-10-19 DIAGNOSIS — G629 Polyneuropathy, unspecified: Secondary | ICD-10-CM | POA: Diagnosis not present

## 2015-10-19 DIAGNOSIS — R1310 Dysphagia, unspecified: Secondary | ICD-10-CM | POA: Diagnosis not present

## 2015-10-22 DIAGNOSIS — G20 Parkinson's disease: Secondary | ICD-10-CM | POA: Diagnosis not present

## 2015-10-22 DIAGNOSIS — R1310 Dysphagia, unspecified: Secondary | ICD-10-CM | POA: Diagnosis not present

## 2015-10-22 DIAGNOSIS — Z9181 History of falling: Secondary | ICD-10-CM | POA: Diagnosis not present

## 2015-10-22 DIAGNOSIS — G629 Polyneuropathy, unspecified: Secondary | ICD-10-CM | POA: Diagnosis not present

## 2015-10-26 DIAGNOSIS — G20 Parkinson's disease: Secondary | ICD-10-CM | POA: Diagnosis not present

## 2015-10-26 DIAGNOSIS — Z9181 History of falling: Secondary | ICD-10-CM | POA: Diagnosis not present

## 2015-10-26 DIAGNOSIS — R1310 Dysphagia, unspecified: Secondary | ICD-10-CM | POA: Diagnosis not present

## 2015-10-26 DIAGNOSIS — G629 Polyneuropathy, unspecified: Secondary | ICD-10-CM | POA: Diagnosis not present

## 2015-10-28 DIAGNOSIS — R1310 Dysphagia, unspecified: Secondary | ICD-10-CM | POA: Diagnosis not present

## 2015-10-28 DIAGNOSIS — Z9181 History of falling: Secondary | ICD-10-CM | POA: Diagnosis not present

## 2015-10-28 DIAGNOSIS — G629 Polyneuropathy, unspecified: Secondary | ICD-10-CM | POA: Diagnosis not present

## 2015-10-28 DIAGNOSIS — G20 Parkinson's disease: Secondary | ICD-10-CM | POA: Diagnosis not present

## 2015-10-29 DIAGNOSIS — R1310 Dysphagia, unspecified: Secondary | ICD-10-CM | POA: Diagnosis not present

## 2015-10-29 DIAGNOSIS — Z9181 History of falling: Secondary | ICD-10-CM | POA: Diagnosis not present

## 2015-10-29 DIAGNOSIS — G629 Polyneuropathy, unspecified: Secondary | ICD-10-CM | POA: Diagnosis not present

## 2015-10-29 DIAGNOSIS — G20 Parkinson's disease: Secondary | ICD-10-CM | POA: Diagnosis not present

## 2015-11-03 DIAGNOSIS — G629 Polyneuropathy, unspecified: Secondary | ICD-10-CM | POA: Diagnosis not present

## 2015-11-03 DIAGNOSIS — R1310 Dysphagia, unspecified: Secondary | ICD-10-CM | POA: Diagnosis not present

## 2015-11-03 DIAGNOSIS — Z9181 History of falling: Secondary | ICD-10-CM | POA: Diagnosis not present

## 2015-11-03 DIAGNOSIS — G20 Parkinson's disease: Secondary | ICD-10-CM | POA: Diagnosis not present

## 2015-11-05 DIAGNOSIS — Z9181 History of falling: Secondary | ICD-10-CM | POA: Diagnosis not present

## 2015-11-05 DIAGNOSIS — G20 Parkinson's disease: Secondary | ICD-10-CM | POA: Diagnosis not present

## 2015-11-05 DIAGNOSIS — R1310 Dysphagia, unspecified: Secondary | ICD-10-CM | POA: Diagnosis not present

## 2015-11-05 DIAGNOSIS — G629 Polyneuropathy, unspecified: Secondary | ICD-10-CM | POA: Diagnosis not present

## 2015-11-08 DIAGNOSIS — G20 Parkinson's disease: Secondary | ICD-10-CM | POA: Diagnosis not present

## 2015-11-08 DIAGNOSIS — Z9181 History of falling: Secondary | ICD-10-CM | POA: Diagnosis not present

## 2015-11-08 DIAGNOSIS — R1310 Dysphagia, unspecified: Secondary | ICD-10-CM | POA: Diagnosis not present

## 2015-11-08 DIAGNOSIS — G629 Polyneuropathy, unspecified: Secondary | ICD-10-CM | POA: Diagnosis not present

## 2015-11-10 DIAGNOSIS — R1310 Dysphagia, unspecified: Secondary | ICD-10-CM | POA: Diagnosis not present

## 2015-11-10 DIAGNOSIS — G20 Parkinson's disease: Secondary | ICD-10-CM | POA: Diagnosis not present

## 2015-11-10 DIAGNOSIS — G629 Polyneuropathy, unspecified: Secondary | ICD-10-CM | POA: Diagnosis not present

## 2015-11-10 DIAGNOSIS — Z9181 History of falling: Secondary | ICD-10-CM | POA: Diagnosis not present

## 2015-11-11 DIAGNOSIS — G629 Polyneuropathy, unspecified: Secondary | ICD-10-CM | POA: Diagnosis not present

## 2015-11-11 DIAGNOSIS — G20 Parkinson's disease: Secondary | ICD-10-CM | POA: Diagnosis not present

## 2015-11-11 DIAGNOSIS — Z9181 History of falling: Secondary | ICD-10-CM | POA: Diagnosis not present

## 2015-11-11 DIAGNOSIS — H6123 Impacted cerumen, bilateral: Secondary | ICD-10-CM | POA: Diagnosis not present

## 2015-11-11 DIAGNOSIS — R1310 Dysphagia, unspecified: Secondary | ICD-10-CM | POA: Diagnosis not present

## 2015-11-12 DIAGNOSIS — G629 Polyneuropathy, unspecified: Secondary | ICD-10-CM | POA: Diagnosis not present

## 2015-11-12 DIAGNOSIS — G20 Parkinson's disease: Secondary | ICD-10-CM | POA: Diagnosis not present

## 2015-11-12 DIAGNOSIS — R1310 Dysphagia, unspecified: Secondary | ICD-10-CM | POA: Diagnosis not present

## 2015-11-12 DIAGNOSIS — Z9181 History of falling: Secondary | ICD-10-CM | POA: Diagnosis not present

## 2015-11-24 DIAGNOSIS — Z9181 History of falling: Secondary | ICD-10-CM | POA: Diagnosis not present

## 2015-11-24 DIAGNOSIS — R1310 Dysphagia, unspecified: Secondary | ICD-10-CM | POA: Diagnosis not present

## 2015-11-24 DIAGNOSIS — G20 Parkinson's disease: Secondary | ICD-10-CM | POA: Diagnosis not present

## 2015-11-24 DIAGNOSIS — G629 Polyneuropathy, unspecified: Secondary | ICD-10-CM | POA: Diagnosis not present

## 2015-11-26 DIAGNOSIS — G629 Polyneuropathy, unspecified: Secondary | ICD-10-CM | POA: Diagnosis not present

## 2015-11-26 DIAGNOSIS — Z9181 History of falling: Secondary | ICD-10-CM | POA: Diagnosis not present

## 2015-11-26 DIAGNOSIS — R1310 Dysphagia, unspecified: Secondary | ICD-10-CM | POA: Diagnosis not present

## 2015-11-26 DIAGNOSIS — G20 Parkinson's disease: Secondary | ICD-10-CM | POA: Diagnosis not present

## 2015-11-29 DIAGNOSIS — Z9181 History of falling: Secondary | ICD-10-CM | POA: Diagnosis not present

## 2015-11-29 DIAGNOSIS — G20 Parkinson's disease: Secondary | ICD-10-CM | POA: Diagnosis not present

## 2015-11-29 DIAGNOSIS — G629 Polyneuropathy, unspecified: Secondary | ICD-10-CM | POA: Diagnosis not present

## 2015-11-29 DIAGNOSIS — R1310 Dysphagia, unspecified: Secondary | ICD-10-CM | POA: Diagnosis not present

## 2015-12-04 DIAGNOSIS — G629 Polyneuropathy, unspecified: Secondary | ICD-10-CM | POA: Diagnosis not present

## 2015-12-04 DIAGNOSIS — R1310 Dysphagia, unspecified: Secondary | ICD-10-CM | POA: Diagnosis not present

## 2015-12-04 DIAGNOSIS — Z9181 History of falling: Secondary | ICD-10-CM | POA: Diagnosis not present

## 2015-12-04 DIAGNOSIS — G20 Parkinson's disease: Secondary | ICD-10-CM | POA: Diagnosis not present

## 2015-12-08 DIAGNOSIS — G20 Parkinson's disease: Secondary | ICD-10-CM | POA: Diagnosis not present

## 2015-12-08 DIAGNOSIS — G629 Polyneuropathy, unspecified: Secondary | ICD-10-CM | POA: Diagnosis not present

## 2015-12-08 DIAGNOSIS — R1310 Dysphagia, unspecified: Secondary | ICD-10-CM | POA: Diagnosis not present

## 2015-12-08 DIAGNOSIS — Z9181 History of falling: Secondary | ICD-10-CM | POA: Diagnosis not present

## 2015-12-10 DIAGNOSIS — G629 Polyneuropathy, unspecified: Secondary | ICD-10-CM | POA: Diagnosis not present

## 2015-12-10 DIAGNOSIS — R1310 Dysphagia, unspecified: Secondary | ICD-10-CM | POA: Diagnosis not present

## 2015-12-10 DIAGNOSIS — Z6827 Body mass index (BMI) 27.0-27.9, adult: Secondary | ICD-10-CM | POA: Diagnosis not present

## 2015-12-10 DIAGNOSIS — Z9181 History of falling: Secondary | ICD-10-CM | POA: Diagnosis not present

## 2015-12-10 DIAGNOSIS — G20 Parkinson's disease: Secondary | ICD-10-CM | POA: Diagnosis not present

## 2015-12-10 DIAGNOSIS — G609 Hereditary and idiopathic neuropathy, unspecified: Secondary | ICD-10-CM | POA: Diagnosis not present

## 2015-12-14 DIAGNOSIS — R1310 Dysphagia, unspecified: Secondary | ICD-10-CM | POA: Diagnosis not present

## 2015-12-14 DIAGNOSIS — G629 Polyneuropathy, unspecified: Secondary | ICD-10-CM | POA: Diagnosis not present

## 2015-12-14 DIAGNOSIS — G20 Parkinson's disease: Secondary | ICD-10-CM | POA: Diagnosis not present

## 2015-12-14 DIAGNOSIS — Z9181 History of falling: Secondary | ICD-10-CM | POA: Diagnosis not present

## 2015-12-15 DIAGNOSIS — G20 Parkinson's disease: Secondary | ICD-10-CM | POA: Diagnosis not present

## 2015-12-15 DIAGNOSIS — Z9181 History of falling: Secondary | ICD-10-CM | POA: Diagnosis not present

## 2015-12-15 DIAGNOSIS — G629 Polyneuropathy, unspecified: Secondary | ICD-10-CM | POA: Diagnosis not present

## 2015-12-15 DIAGNOSIS — R1310 Dysphagia, unspecified: Secondary | ICD-10-CM | POA: Diagnosis not present

## 2015-12-16 DIAGNOSIS — R1310 Dysphagia, unspecified: Secondary | ICD-10-CM | POA: Diagnosis not present

## 2015-12-16 DIAGNOSIS — G629 Polyneuropathy, unspecified: Secondary | ICD-10-CM | POA: Diagnosis not present

## 2015-12-16 DIAGNOSIS — Z9181 History of falling: Secondary | ICD-10-CM | POA: Diagnosis not present

## 2015-12-16 DIAGNOSIS — G20 Parkinson's disease: Secondary | ICD-10-CM | POA: Diagnosis not present

## 2015-12-16 DIAGNOSIS — L989 Disorder of the skin and subcutaneous tissue, unspecified: Secondary | ICD-10-CM | POA: Diagnosis not present

## 2015-12-20 DIAGNOSIS — L989 Disorder of the skin and subcutaneous tissue, unspecified: Secondary | ICD-10-CM | POA: Diagnosis not present

## 2015-12-20 DIAGNOSIS — R1310 Dysphagia, unspecified: Secondary | ICD-10-CM | POA: Diagnosis not present

## 2015-12-20 DIAGNOSIS — G629 Polyneuropathy, unspecified: Secondary | ICD-10-CM | POA: Diagnosis not present

## 2015-12-20 DIAGNOSIS — Z9181 History of falling: Secondary | ICD-10-CM | POA: Diagnosis not present

## 2015-12-20 DIAGNOSIS — G20 Parkinson's disease: Secondary | ICD-10-CM | POA: Diagnosis not present

## 2015-12-22 DIAGNOSIS — R1310 Dysphagia, unspecified: Secondary | ICD-10-CM | POA: Diagnosis not present

## 2015-12-22 DIAGNOSIS — L989 Disorder of the skin and subcutaneous tissue, unspecified: Secondary | ICD-10-CM | POA: Diagnosis not present

## 2015-12-22 DIAGNOSIS — G20 Parkinson's disease: Secondary | ICD-10-CM | POA: Diagnosis not present

## 2015-12-22 DIAGNOSIS — Z9181 History of falling: Secondary | ICD-10-CM | POA: Diagnosis not present

## 2015-12-22 DIAGNOSIS — G629 Polyneuropathy, unspecified: Secondary | ICD-10-CM | POA: Diagnosis not present

## 2015-12-23 DIAGNOSIS — Z9181 History of falling: Secondary | ICD-10-CM | POA: Diagnosis not present

## 2015-12-23 DIAGNOSIS — R1310 Dysphagia, unspecified: Secondary | ICD-10-CM | POA: Diagnosis not present

## 2015-12-23 DIAGNOSIS — G629 Polyneuropathy, unspecified: Secondary | ICD-10-CM | POA: Diagnosis not present

## 2015-12-23 DIAGNOSIS — G20 Parkinson's disease: Secondary | ICD-10-CM | POA: Diagnosis not present

## 2015-12-23 DIAGNOSIS — L989 Disorder of the skin and subcutaneous tissue, unspecified: Secondary | ICD-10-CM | POA: Diagnosis not present

## 2015-12-25 DIAGNOSIS — Z9181 History of falling: Secondary | ICD-10-CM | POA: Diagnosis not present

## 2015-12-25 DIAGNOSIS — R1310 Dysphagia, unspecified: Secondary | ICD-10-CM | POA: Diagnosis not present

## 2015-12-25 DIAGNOSIS — G20 Parkinson's disease: Secondary | ICD-10-CM | POA: Diagnosis not present

## 2015-12-25 DIAGNOSIS — L989 Disorder of the skin and subcutaneous tissue, unspecified: Secondary | ICD-10-CM | POA: Diagnosis not present

## 2015-12-25 DIAGNOSIS — G629 Polyneuropathy, unspecified: Secondary | ICD-10-CM | POA: Diagnosis not present

## 2015-12-27 DIAGNOSIS — G20 Parkinson's disease: Secondary | ICD-10-CM | POA: Diagnosis not present

## 2015-12-27 DIAGNOSIS — G629 Polyneuropathy, unspecified: Secondary | ICD-10-CM | POA: Diagnosis not present

## 2015-12-27 DIAGNOSIS — R1310 Dysphagia, unspecified: Secondary | ICD-10-CM | POA: Diagnosis not present

## 2015-12-27 DIAGNOSIS — L989 Disorder of the skin and subcutaneous tissue, unspecified: Secondary | ICD-10-CM | POA: Diagnosis not present

## 2015-12-27 DIAGNOSIS — Z9181 History of falling: Secondary | ICD-10-CM | POA: Diagnosis not present

## 2015-12-28 DIAGNOSIS — R1310 Dysphagia, unspecified: Secondary | ICD-10-CM | POA: Diagnosis not present

## 2015-12-28 DIAGNOSIS — Z9181 History of falling: Secondary | ICD-10-CM | POA: Diagnosis not present

## 2015-12-28 DIAGNOSIS — G629 Polyneuropathy, unspecified: Secondary | ICD-10-CM | POA: Diagnosis not present

## 2015-12-28 DIAGNOSIS — G20 Parkinson's disease: Secondary | ICD-10-CM | POA: Diagnosis not present

## 2015-12-28 DIAGNOSIS — L989 Disorder of the skin and subcutaneous tissue, unspecified: Secondary | ICD-10-CM | POA: Diagnosis not present

## 2015-12-31 DIAGNOSIS — G629 Polyneuropathy, unspecified: Secondary | ICD-10-CM | POA: Diagnosis not present

## 2015-12-31 DIAGNOSIS — G20 Parkinson's disease: Secondary | ICD-10-CM | POA: Diagnosis not present

## 2015-12-31 DIAGNOSIS — R1310 Dysphagia, unspecified: Secondary | ICD-10-CM | POA: Diagnosis not present

## 2015-12-31 DIAGNOSIS — Z9181 History of falling: Secondary | ICD-10-CM | POA: Diagnosis not present

## 2015-12-31 DIAGNOSIS — L989 Disorder of the skin and subcutaneous tissue, unspecified: Secondary | ICD-10-CM | POA: Diagnosis not present

## 2016-01-04 DIAGNOSIS — G20 Parkinson's disease: Secondary | ICD-10-CM | POA: Diagnosis not present

## 2016-01-04 DIAGNOSIS — Z9181 History of falling: Secondary | ICD-10-CM | POA: Diagnosis not present

## 2016-01-04 DIAGNOSIS — G629 Polyneuropathy, unspecified: Secondary | ICD-10-CM | POA: Diagnosis not present

## 2016-01-04 DIAGNOSIS — R1310 Dysphagia, unspecified: Secondary | ICD-10-CM | POA: Diagnosis not present

## 2016-01-04 DIAGNOSIS — L989 Disorder of the skin and subcutaneous tissue, unspecified: Secondary | ICD-10-CM | POA: Diagnosis not present

## 2016-01-07 DIAGNOSIS — L989 Disorder of the skin and subcutaneous tissue, unspecified: Secondary | ICD-10-CM | POA: Diagnosis not present

## 2016-01-07 DIAGNOSIS — G629 Polyneuropathy, unspecified: Secondary | ICD-10-CM | POA: Diagnosis not present

## 2016-01-07 DIAGNOSIS — R1310 Dysphagia, unspecified: Secondary | ICD-10-CM | POA: Diagnosis not present

## 2016-01-07 DIAGNOSIS — Z9181 History of falling: Secondary | ICD-10-CM | POA: Diagnosis not present

## 2016-01-07 DIAGNOSIS — G20 Parkinson's disease: Secondary | ICD-10-CM | POA: Diagnosis not present

## 2016-01-11 DIAGNOSIS — Z9181 History of falling: Secondary | ICD-10-CM | POA: Diagnosis not present

## 2016-01-11 DIAGNOSIS — G20 Parkinson's disease: Secondary | ICD-10-CM | POA: Diagnosis not present

## 2016-01-11 DIAGNOSIS — L989 Disorder of the skin and subcutaneous tissue, unspecified: Secondary | ICD-10-CM | POA: Diagnosis not present

## 2016-01-11 DIAGNOSIS — G629 Polyneuropathy, unspecified: Secondary | ICD-10-CM | POA: Diagnosis not present

## 2016-01-11 DIAGNOSIS — R1310 Dysphagia, unspecified: Secondary | ICD-10-CM | POA: Diagnosis not present

## 2016-01-12 DIAGNOSIS — G629 Polyneuropathy, unspecified: Secondary | ICD-10-CM | POA: Diagnosis not present

## 2016-01-12 DIAGNOSIS — G20 Parkinson's disease: Secondary | ICD-10-CM | POA: Diagnosis not present

## 2016-01-12 DIAGNOSIS — Z9181 History of falling: Secondary | ICD-10-CM | POA: Diagnosis not present

## 2016-01-12 DIAGNOSIS — R1310 Dysphagia, unspecified: Secondary | ICD-10-CM | POA: Diagnosis not present

## 2016-01-12 DIAGNOSIS — L989 Disorder of the skin and subcutaneous tissue, unspecified: Secondary | ICD-10-CM | POA: Diagnosis not present

## 2016-01-19 DIAGNOSIS — L989 Disorder of the skin and subcutaneous tissue, unspecified: Secondary | ICD-10-CM | POA: Diagnosis not present

## 2016-01-19 DIAGNOSIS — R1310 Dysphagia, unspecified: Secondary | ICD-10-CM | POA: Diagnosis not present

## 2016-01-19 DIAGNOSIS — Z9181 History of falling: Secondary | ICD-10-CM | POA: Diagnosis not present

## 2016-01-19 DIAGNOSIS — G629 Polyneuropathy, unspecified: Secondary | ICD-10-CM | POA: Diagnosis not present

## 2016-01-19 DIAGNOSIS — G20 Parkinson's disease: Secondary | ICD-10-CM | POA: Diagnosis not present

## 2016-01-21 DIAGNOSIS — Z9181 History of falling: Secondary | ICD-10-CM | POA: Diagnosis not present

## 2016-01-21 DIAGNOSIS — L989 Disorder of the skin and subcutaneous tissue, unspecified: Secondary | ICD-10-CM | POA: Diagnosis not present

## 2016-01-21 DIAGNOSIS — G629 Polyneuropathy, unspecified: Secondary | ICD-10-CM | POA: Diagnosis not present

## 2016-01-21 DIAGNOSIS — R1310 Dysphagia, unspecified: Secondary | ICD-10-CM | POA: Diagnosis not present

## 2016-01-21 DIAGNOSIS — G20 Parkinson's disease: Secondary | ICD-10-CM | POA: Diagnosis not present

## 2016-01-26 DIAGNOSIS — Z9181 History of falling: Secondary | ICD-10-CM | POA: Diagnosis not present

## 2016-01-26 DIAGNOSIS — R1310 Dysphagia, unspecified: Secondary | ICD-10-CM | POA: Diagnosis not present

## 2016-01-26 DIAGNOSIS — L989 Disorder of the skin and subcutaneous tissue, unspecified: Secondary | ICD-10-CM | POA: Diagnosis not present

## 2016-01-26 DIAGNOSIS — G20 Parkinson's disease: Secondary | ICD-10-CM | POA: Diagnosis not present

## 2016-01-26 DIAGNOSIS — G629 Polyneuropathy, unspecified: Secondary | ICD-10-CM | POA: Diagnosis not present

## 2016-01-27 DIAGNOSIS — L989 Disorder of the skin and subcutaneous tissue, unspecified: Secondary | ICD-10-CM | POA: Diagnosis not present

## 2016-01-27 DIAGNOSIS — G20 Parkinson's disease: Secondary | ICD-10-CM | POA: Diagnosis not present

## 2016-01-27 DIAGNOSIS — R1310 Dysphagia, unspecified: Secondary | ICD-10-CM | POA: Diagnosis not present

## 2016-01-27 DIAGNOSIS — Z9181 History of falling: Secondary | ICD-10-CM | POA: Diagnosis not present

## 2016-01-27 DIAGNOSIS — G629 Polyneuropathy, unspecified: Secondary | ICD-10-CM | POA: Diagnosis not present

## 2016-01-28 DIAGNOSIS — Z9181 History of falling: Secondary | ICD-10-CM | POA: Diagnosis not present

## 2016-01-28 DIAGNOSIS — L989 Disorder of the skin and subcutaneous tissue, unspecified: Secondary | ICD-10-CM | POA: Diagnosis not present

## 2016-01-28 DIAGNOSIS — R1310 Dysphagia, unspecified: Secondary | ICD-10-CM | POA: Diagnosis not present

## 2016-01-28 DIAGNOSIS — G629 Polyneuropathy, unspecified: Secondary | ICD-10-CM | POA: Diagnosis not present

## 2016-01-28 DIAGNOSIS — G20 Parkinson's disease: Secondary | ICD-10-CM | POA: Diagnosis not present

## 2016-02-01 DIAGNOSIS — Z9181 History of falling: Secondary | ICD-10-CM | POA: Diagnosis not present

## 2016-02-01 DIAGNOSIS — L989 Disorder of the skin and subcutaneous tissue, unspecified: Secondary | ICD-10-CM | POA: Diagnosis not present

## 2016-02-01 DIAGNOSIS — R1310 Dysphagia, unspecified: Secondary | ICD-10-CM | POA: Diagnosis not present

## 2016-02-01 DIAGNOSIS — G629 Polyneuropathy, unspecified: Secondary | ICD-10-CM | POA: Diagnosis not present

## 2016-02-01 DIAGNOSIS — G20 Parkinson's disease: Secondary | ICD-10-CM | POA: Diagnosis not present

## 2016-02-02 DIAGNOSIS — G20 Parkinson's disease: Secondary | ICD-10-CM | POA: Diagnosis not present

## 2016-02-02 DIAGNOSIS — G629 Polyneuropathy, unspecified: Secondary | ICD-10-CM | POA: Diagnosis not present

## 2016-02-02 DIAGNOSIS — R1310 Dysphagia, unspecified: Secondary | ICD-10-CM | POA: Diagnosis not present

## 2016-02-02 DIAGNOSIS — L989 Disorder of the skin and subcutaneous tissue, unspecified: Secondary | ICD-10-CM | POA: Diagnosis not present

## 2016-02-02 DIAGNOSIS — Z9181 History of falling: Secondary | ICD-10-CM | POA: Diagnosis not present

## 2016-02-09 DIAGNOSIS — L989 Disorder of the skin and subcutaneous tissue, unspecified: Secondary | ICD-10-CM | POA: Diagnosis not present

## 2016-02-09 DIAGNOSIS — R1310 Dysphagia, unspecified: Secondary | ICD-10-CM | POA: Diagnosis not present

## 2016-02-09 DIAGNOSIS — G629 Polyneuropathy, unspecified: Secondary | ICD-10-CM | POA: Diagnosis not present

## 2016-02-09 DIAGNOSIS — G20 Parkinson's disease: Secondary | ICD-10-CM | POA: Diagnosis not present

## 2016-02-09 DIAGNOSIS — Z9181 History of falling: Secondary | ICD-10-CM | POA: Diagnosis not present

## 2016-04-07 DIAGNOSIS — G609 Hereditary and idiopathic neuropathy, unspecified: Secondary | ICD-10-CM | POA: Diagnosis not present

## 2016-04-07 DIAGNOSIS — G20 Parkinson's disease: Secondary | ICD-10-CM | POA: Diagnosis not present

## 2016-04-07 DIAGNOSIS — M40203 Unspecified kyphosis, cervicothoracic region: Secondary | ICD-10-CM | POA: Diagnosis not present

## 2016-04-07 DIAGNOSIS — M47814 Spondylosis without myelopathy or radiculopathy, thoracic region: Secondary | ICD-10-CM | POA: Diagnosis not present

## 2016-04-07 DIAGNOSIS — Z6827 Body mass index (BMI) 27.0-27.9, adult: Secondary | ICD-10-CM | POA: Diagnosis not present

## 2016-04-07 DIAGNOSIS — Z9181 History of falling: Secondary | ICD-10-CM | POA: Diagnosis not present

## 2016-05-05 ENCOUNTER — Ambulatory Visit: Payer: BLUE CROSS/BLUE SHIELD

## 2016-05-29 ENCOUNTER — Encounter: Payer: Self-pay | Admitting: Internal Medicine

## 2016-05-29 ENCOUNTER — Ambulatory Visit (INDEPENDENT_AMBULATORY_CARE_PROVIDER_SITE_OTHER): Payer: Medicare Other | Admitting: Internal Medicine

## 2016-05-29 VITALS — BP 116/66 | HR 74 | Temp 98.1°F | Wt 177.5 lb

## 2016-05-29 DIAGNOSIS — N401 Enlarged prostate with lower urinary tract symptoms: Secondary | ICD-10-CM | POA: Diagnosis not present

## 2016-05-29 DIAGNOSIS — Z7189 Other specified counseling: Secondary | ICD-10-CM

## 2016-05-29 DIAGNOSIS — G20 Parkinson's disease: Secondary | ICD-10-CM

## 2016-05-29 DIAGNOSIS — R7301 Impaired fasting glucose: Secondary | ICD-10-CM

## 2016-05-29 DIAGNOSIS — Z0000 Encounter for general adult medical examination without abnormal findings: Secondary | ICD-10-CM

## 2016-05-29 DIAGNOSIS — M40202 Unspecified kyphosis, cervical region: Secondary | ICD-10-CM | POA: Diagnosis not present

## 2016-05-29 DIAGNOSIS — B372 Candidiasis of skin and nail: Secondary | ICD-10-CM | POA: Diagnosis not present

## 2016-05-29 DIAGNOSIS — I10 Essential (primary) hypertension: Secondary | ICD-10-CM | POA: Diagnosis not present

## 2016-05-29 DIAGNOSIS — N138 Other obstructive and reflux uropathy: Secondary | ICD-10-CM | POA: Diagnosis not present

## 2016-05-29 LAB — CBC WITH DIFFERENTIAL/PLATELET
BASOS ABS: 0 10*3/uL (ref 0.0–0.1)
Basophils Relative: 0.6 % (ref 0.0–3.0)
EOS ABS: 0.1 10*3/uL (ref 0.0–0.7)
Eosinophils Relative: 1.2 % (ref 0.0–5.0)
HCT: 41.2 % (ref 39.0–52.0)
HEMOGLOBIN: 14.4 g/dL (ref 13.0–17.0)
LYMPHS ABS: 1.5 10*3/uL (ref 0.7–4.0)
Lymphocytes Relative: 19.7 % (ref 12.0–46.0)
MCHC: 34.9 g/dL (ref 30.0–36.0)
MCV: 91 fl (ref 78.0–100.0)
MONO ABS: 0.6 10*3/uL (ref 0.1–1.0)
Monocytes Relative: 7.1 % (ref 3.0–12.0)
NEUTROS PCT: 71.4 % (ref 43.0–77.0)
Neutro Abs: 5.5 10*3/uL (ref 1.4–7.7)
Platelets: 201 10*3/uL (ref 150.0–400.0)
RBC: 4.52 Mil/uL (ref 4.22–5.81)
RDW: 13.1 % (ref 11.5–15.5)
WBC: 7.7 10*3/uL (ref 4.0–10.5)

## 2016-05-29 LAB — HEMOGLOBIN A1C: HEMOGLOBIN A1C: 5.7 % (ref 4.6–6.5)

## 2016-05-29 LAB — COMPREHENSIVE METABOLIC PANEL
ALBUMIN: 4.4 g/dL (ref 3.5–5.2)
ALK PHOS: 63 U/L (ref 39–117)
ALT: 12 U/L (ref 0–53)
AST: 14 U/L (ref 0–37)
BILIRUBIN TOTAL: 0.5 mg/dL (ref 0.2–1.2)
BUN: 19 mg/dL (ref 6–23)
CO2: 29 mEq/L (ref 19–32)
CREATININE: 0.86 mg/dL (ref 0.40–1.50)
Calcium: 9.5 mg/dL (ref 8.4–10.5)
Chloride: 105 mEq/L (ref 96–112)
GFR: 91.45 mL/min (ref >60.00)
GLUCOSE: 115 mg/dL — AB (ref 70–99)
POTASSIUM: 4.1 meq/L (ref 3.5–5.1)
SODIUM: 143 meq/L (ref 135–145)
TOTAL PROTEIN: 7.3 g/dL (ref 6.0–8.3)

## 2016-05-29 LAB — T4, FREE: FREE T4: 0.86 ng/dL (ref 0.60–1.60)

## 2016-05-29 MED ORDER — FLUCONAZOLE 150 MG PO TABS: 150.0000 mg | ORAL_TABLET | ORAL | 11 refills | Status: DC

## 2016-05-29 NOTE — Assessment & Plan Note (Signed)
Will recheck labs 

## 2016-05-29 NOTE — Progress Notes (Signed)
Pre visit review using our clinic review tool, if applicable. No additional management support is needed unless otherwise documented below in the visit note. 

## 2016-05-29 NOTE — Assessment & Plan Note (Signed)
No pain Doesn't want brace Has soft neck collar but doesn't use it

## 2016-05-29 NOTE — Assessment & Plan Note (Signed)
No major decline Has some help but mostly lives independently still Keeps up with neurology

## 2016-05-29 NOTE — Assessment & Plan Note (Signed)
Doesn't want meds Would try tamsulosin if worsens

## 2016-05-29 NOTE — Progress Notes (Signed)
Subjective:    Patient ID: Alan Peterson, male    DOB: 02/24/1941, 76 y.o.   MRN: [id number]  HPI Here with son for Medicare wellness and follow up of chronic health conditions Reviewed form and advanced directives Reviewed other doctors---neurologist [facility] No alcohol or tobacco Tries to exercise but very limited Vision and hearing okay--he feels No falls!! 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 of [employer] Walks with cane  Has sore on buttocks still Not as bad but still there Occasionally opens up Uses the cream---zinc cream (never got the ketoconazole) Sore some nights--may affect his sleep  No chest pain or SOB No dizziness No palpitations Occasional mild edema --- resolves on its own  Getting more difficult to start urinary stream He does stand Nocturia x 1 usually--using depends at night (leaks by the time he gets out of chair)  Not checking sugars Tries to eat healthy Weight is down about 5#  Current Outpatient Prescriptions on File Prior to Visit  Medication Sig Dispense Refill  . carbidopa-levodopa (SINEMET IR) 25-100 MG tablet Take by mouth.     No current facility-administered medications on file prior to visit.     No Known Allergies  Past Medical History:  Diagnosis Date  . BPH (benign prostatic hypertrophy)   . Diabetes mellitus   . Hyperlipidemia   . Hypertension   . Parkinson's disease     Past Surgical History:  Procedure Laterality Date  . FINGER CONTRACTURE RELEASE  1980   right, left 4th finger release 8/10    Family History  Problem Relation Age of Onset  . Hypertension Mother   . Coronary artery disease Neg Hx   .  Diabetes Neg Hx   . Cancer Neg Hx     Social History   Social History  . Marital status: Divorced    Spouse name: N/A  . Number of children: 2  . Years of education: N/A   Occupational History  . 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 Smoking status: Never Smoker  . Smokeless tobacco: Never Used  . Alcohol use Yes     Comment: beer  . Drug use: Unknown  . Sexual activity: Not on file   Other Topics Concern  . Not on file   Social History Narrative   Son lives with him [place] the farm      Has a living will "somewhere"   Requests son [name] to make decisions for him--now has health care POA   Would accept resuscitation attempts   Probably would want tube feeds   Review of Systems Sleeps okay Doesn't drive-- does wear seat belt Appetite is okay Teeth in bad shape--needs to have them pulled No heartburn or dysphagia Bowels are fine. No blood in stool No other skin problems Getting stiff in hands. No sig back pain. He does try hand exercises    Objective:   Physical Exam  Constitutional: He is oriented to person, place, and time. No distress.  Neck: No thyromegaly present.  Marked cervical kyphosis  Cardiovascular: Normal rate, regular rhythm, normal heart sounds and intact distal pulses.  Exam reveals  no gallop.   No murmur heard. Faint pedal pulses---purplish coloration of distal feet though  Pulmonary/Chest: Effort normal and breath sounds normal. No respiratory distress. He has no wheezes. He has no rales.  Abdominal: Soft. There is no tenderness.  Musculoskeletal:  Trace pedal edema  Lymphadenopathy:    He has no cervical adenopathy.  Neurological: He is alert and oriented to person, place, and time.  President-- "Garnet Koyanagi, Phillips Odor' [id number] D-l-o-r-w Recall 3/3  Increased tone and bradykinesai Slight tremor  Skin:  Reddened areas in buttock folds without ulcerations    Psychiatric: He has a normal mood and affect. His behavior is normal.          Assessment & Plan:

## 2016-05-29 NOTE — Assessment & Plan Note (Signed)
BP Readings from Last 3 Encounters:  05/29/16 116/66  05/04/15 118/80  12/14/14 (!) 160/80   Fine without meds

## 2016-05-29 NOTE — Assessment & Plan Note (Signed)
Now has POA officially

## 2016-05-29 NOTE — Assessment & Plan Note (Signed)
I have personally reviewed the Medicare Annual Wellness questionnaire and have noted 1. The patient's medical and social history 2. Their use of alcohol, tobacco or illicit drugs 3. Their current medications and supplements 4. The patient's functional ability including ADL's, fall risks, home safety risks and hearing or visual             impairment. 5. Diet and physical activities 6. Evidence for depression or mood disorders  The patients weight, height, BMI and visual acuity have been recorded in the chart I have made referrals, counseling and provided education to the patient based review of the above and I have provided the pt with a written personalized care plan for preventive services.  I have provided you with a copy of your personalized plan for preventive services. Please take the time to review along with your updated medication list.  He prefers no cancer screening or immunizations Tries to eat well and stay as active as he can

## 2016-05-29 NOTE — Assessment & Plan Note (Signed)
Will give fluconazole for weekly use

## 2016-05-30 ENCOUNTER — Encounter: Payer: Self-pay | Admitting: *Deleted

## 2017-05-15 ENCOUNTER — Other Ambulatory Visit: Payer: Self-pay | Admitting: *Deleted

## 2017-05-15 MED ORDER — CARBIDOPA-LEVODOPA 25-100 MG PO TABS: 1.0000 | ORAL_TABLET | Freq: Three times a day (TID) | ORAL | 0 refills | Status: DC

## 2017-06-04 ENCOUNTER — Encounter: Payer: Self-pay | Admitting: Internal Medicine

## 2017-06-04 ENCOUNTER — Ambulatory Visit (INDEPENDENT_AMBULATORY_CARE_PROVIDER_SITE_OTHER): Payer: Medicare Other | Admitting: Internal Medicine

## 2017-06-04 VITALS — BP 118/72 | HR 72 | Temp 97.8°F | Ht 69.0 in | Wt 162.0 lb

## 2017-06-04 DIAGNOSIS — G20 Parkinson's disease: Secondary | ICD-10-CM | POA: Diagnosis not present

## 2017-06-04 DIAGNOSIS — I10 Essential (primary) hypertension: Secondary | ICD-10-CM

## 2017-06-04 DIAGNOSIS — I872 Venous insufficiency (chronic) (peripheral): Secondary | ICD-10-CM | POA: Diagnosis not present

## 2017-06-04 DIAGNOSIS — M40202 Unspecified kyphosis, cervical region: Secondary | ICD-10-CM | POA: Diagnosis not present

## 2017-06-04 DIAGNOSIS — Z0000 Encounter for general adult medical examination without abnormal findings: Secondary | ICD-10-CM | POA: Diagnosis not present

## 2017-06-04 DIAGNOSIS — Z7189 Other specified counseling: Secondary | ICD-10-CM | POA: Diagnosis not present

## 2017-06-04 DIAGNOSIS — E441 Mild protein-calorie malnutrition: Secondary | ICD-10-CM

## 2017-06-04 LAB — CBC
HEMATOCRIT: 42.8 % (ref 39.0–52.0)
Hemoglobin: 15 g/dL (ref 13.0–17.0)
MCHC: 35 g/dL (ref 30.0–36.0)
MCV: 92 fl (ref 78.0–100.0)
Platelets: 185 10*3/uL (ref 150.0–400.0)
RBC: 4.65 Mil/uL (ref 4.22–5.81)
RDW: 13.1 % (ref 11.5–15.5)
WBC: 7.5 10*3/uL (ref 4.0–10.5)

## 2017-06-04 LAB — COMPREHENSIVE METABOLIC PANEL
ALBUMIN: 4.2 g/dL (ref 3.5–5.2)
ALT: 4 U/L (ref 0–53)
AST: 14 U/L (ref 0–37)
Alkaline Phosphatase: 55 U/L (ref 39–117)
BUN: 21 mg/dL (ref 6–23)
CHLORIDE: 105 meq/L (ref 96–112)
CO2: 27 meq/L (ref 19–32)
CREATININE: 0.79 mg/dL (ref 0.40–1.50)
Calcium: 9.7 mg/dL (ref 8.4–10.5)
GFR: 100.6 mL/min (ref >60.00)
GLUCOSE: 111 mg/dL — AB (ref 70–99)
Potassium: 4.1 mEq/L (ref 3.5–5.1)
SODIUM: 142 meq/L (ref 135–145)
Total Bilirubin: 0.6 mg/dL (ref 0.2–1.2)
Total Protein: 7.5 g/dL (ref 6.0–8.3)

## 2017-06-04 LAB — T4, FREE: Free T4: 0.93 ng/dL (ref 0.60–1.60)

## 2017-06-04 MED ORDER — CARBIDOPA-LEVODOPA 25-100 MG PO TABS: 1.0000 | ORAL_TABLET | Freq: Three times a day (TID) | ORAL | 3 refills | Status: DC

## 2017-06-04 NOTE — Assessment & Plan Note (Signed)
Probably related to the neuro condition and mostly just hyperflexed Does affect his swallowing

## 2017-06-04 NOTE — Assessment & Plan Note (Signed)
Marked venous pooling but no evidence of arterial insufficiciency

## 2017-06-04 NOTE — Assessment & Plan Note (Signed)
I have personally reviewed the Medicare Annual Wellness questionnaire and have noted 1. The patient's medical and social history 2. Their use of alcohol, tobacco or illicit drugs 3. Their current medications and supplements 4. The patient's functional ability including ADL's, fall risks, home safety risks and hearing or visual             impairment. 5. Diet and physical activities 6. Evidence for depression or mood disorders  The patients weight, height, BMI and visual acuity have been recorded in the chart I have made referrals, counseling and provided education to the patient based review of the above and I have provided the pt with a written personalized care plan for preventive services.  I have provided you with a copy of your personalized plan for preventive services. Please take the time to review along with your updated medication list.  He still prefers no cancer screening or immunizations Discussed functional status and eating

## 2017-06-04 NOTE — Progress Notes (Signed)
Subjective:    Patient ID: Alan Peterson, male    DOB: 11-Sep-1940, 77 y.o.   MRN: [id number]  HPI Here with son for Medicare wellness and follow up of chronic health conditions Reviewed advanced directives Reviewed other doctors--no other doctors. No hospital or ER visits this year Vision is blurry---and occasional diplopia by the end of the day (especially when using the computer) Doesn't drive Hearing is an issue---thinks he has wax No tobacco or alcohol Thinks he has had 2 or 3 falls---no injury Memory seems to be okay Not really depressed---"disapointed sometimes". 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 fluconazole (DIFLUCAN) 150 MG tablet Take 1 tablet (150 mg total) by mouth once a week. (Patient not taking: Reported on 06/04/2017) 4 tablet 11   No current facility-administered medications on file prior to visit.     No Known Allergies  Past Medical History:  Diagnosis Date  . BPH (benign prostatic hypertrophy)   . Diabetes mellitus   . Hyperlipidemia   . Hypertension   . Parkinson's disease       Past Surgical History:  Procedure Laterality Date  . FINGER CONTRACTURE RELEASE  1980   right, left 4th finger release 8/10    Family History  Problem Relation Age of Onset  . Hypertension Mother   . Coronary artery disease Neg Hx   . Diabetes Neg Hx   . Cancer Neg Hx     Social History   Socioeconomic History  . Marital status: Divorced    Spouse name: Not on file  . Number of children: 2  . Years of education: Not on file  . Highest education level: Not on file  Occupational History  . 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 Financial resource strain: Not on file  . Food insecurity:    Worry: Not on file    Inability: Not on file  . Transportation needs:    Medical: Not on file    Non-medical: Not on file  Tobacco Use  . Smoking status: Never Smoker  . Smokeless tobacco: Never Used  Substance and Sexual Activity  . Alcohol use: Yes    Comment: beer  . Drug use: Not on file  . Sexual activity: Not on file  Lifestyle  . Physical activity:    Days per week: Not on file    Minutes per session: Not on file  . Stress: Not on file  Relationships  . Social connections:    Talks on phone: Not on file    Gets together: Not on file  Attends religious service: Not on file    Active member of club or organization: Not on file    Attends meetings of clubs or organizations: Not on file    Relationship status: Not on file  . Intimate partner violence:    Fear of current or ex partner: Not on file    Emotionally abused: Not on file    Physically abused: Not on file    Forced sexual activity: Not on file  Other Topics Concern  . Not on file  Social History Narrative   [place] with him [place] the farm      Has a living will "somewhere"   Requests son Alan Peterson to make decisions for him--now has health care POA   Would accept resuscitation attempts   Probably would want tube feeds   Review of  Systems Teeth are bad---needs teeth extracted and to get dentures (but hasn't done this) No headaches Wears seat belt Bowels are okay--no blood No recent rash issues--hasn't needed the fluconazole recently No heartburn Some increasing dysphagia--relates to the kyphosis    Objective:   Physical Exam  Constitutional: He is oriented to person, place, and time. No distress.  HENT:  Mouth/Throat: Oropharynx is clear and moist. No oropharyngeal exudate.  Soft cerumen in both canals---flushed by CNA  Neck: No thyromegaly present.  Flexion without striking true kyphosis  Cardiovascular: Normal rate, regular rhythm and normal heart sounds. Exam reveals no gallop.  No murmur heard. Faint DP pulses both feet  Pulmonary/Chest: Effort normal and breath sounds normal. No respiratory distress. He has no wheezes. He has no rales.  Abdominal: Soft. There is no tenderness.  Musculoskeletal:  Trace pedal edema Purplish discoloration in feet with slow refill  Lymphadenopathy:    He has no cervical adenopathy.  Neurological: He is alert and oriented to person, place, and time.  President--- "Alan Peterson, Bush" [phone] D-l-o-r-w Recall 2/3  Drooling persistently Some bradykinesia No sig tremor  Skin: No rash noted. No erythema.  Psychiatric: He has a normal mood and affect. His behavior is normal.          Assessment & Plan:

## 2017-06-04 NOTE — Assessment & Plan Note (Signed)
Discussed adding supplements and monitoring

## 2017-06-04 NOTE — Progress Notes (Signed)
Hearing Screening Comments: Pt states his ears are full of wax Vision Screening Comments: Difficult to obtain

## 2017-06-04 NOTE — Assessment & Plan Note (Signed)
BP Readings from Last 3 Encounters:  06/04/17 118/72  05/29/16 116/66  05/04/15 118/80   Still fine without Rx

## 2017-06-04 NOTE — Assessment & Plan Note (Signed)
See social history 

## 2017-06-04 NOTE — Assessment & Plan Note (Signed)
Continues on the sinemet Slow functional decline

## 2017-08-13 ENCOUNTER — Other Ambulatory Visit: Payer: Self-pay | Admitting: Internal Medicine

## 2017-08-13 NOTE — Telephone Encounter (Signed)
Approved: #4 x 5

## 2017-08-23 ENCOUNTER — Ambulatory Visit: Payer: Self-pay

## 2017-08-23 NOTE — Telephone Encounter (Signed)
Patient's son called in and says "every since Tuesday, I've noticed my father's speech slurred, he's stiffer than normal with his Parkinson's, and he's a little weaker. My father says this started on Monday after he took Fluconazole on Sunday. I didn't notice it until Tuesday. The home health nurse came today and she tested his grips and they were equal, but she told me to go ahead and call the office." I asked about his face to see if it's symmetrical, he says "his face is droopy anyway and he drools, so it's hard to determine from his face." I asked about his swallowing, he says "not that I've notice any difficulty, but because of Parkinson's he has some difficulty anyway." I asked about other symptoms, he says "his appetite is not good, and that is unusual for him." According to protocol, see PCP within 24 hours per patient request, the son says "he will not go to the ED. He will sit here and die before going there. He even told me not to call today and wait until tomorrow, because he knows there would be no openings tomorrow and he will have to wait all weekend without being seen." No availability with PCP, appointment scheduled for tomorrow at 0915 with Olean Reeeborah Gessner, FNP, care advice given, patient verbalized understanding.   Reason for Disposition . [1] Loss of speech or garbled speech AND [2] gradual onset (e.g., days to weeks) AND [3] present now  Answer Assessment - Initial Assessment Questions 1. SYMPTOM: "What is the main symptom you are concerned about?" (e.g., weakness, numbness)     Increased weakness, slurred speech, stiffness 2. ONSET: "When did this start?" (minutes, hours, days; while sleeping)     Tuesday; patient says started on Monday  3. LAST NORMAL: "When was the last time you were normal (no symptoms)?"     Sunday 4. PATTERN "Does this come and go, or has it been constant since it started?"  "Is it present now?"     Constant since Tuesday 5. CARDIAC SYMPTOMS: "Have you had  any of the following symptoms: chest pain, difficulty breathing, palpitations?"     No 6. NEUROLOGIC SYMPTOMS: "Have you had any of the following symptoms: headache, dizziness, vision loss, double vision, changes in speech, unsteady on your feet?"     Changes in speech 7. OTHER SYMPTOMS: "Do you have any other symptoms?"     Not much appetite 8. PREGNANCY: "Is there any chance you are pregnant?" "When was your last menstrual period?"     N/A  Protocols used: NEUROLOGIC DEFICIT-A-AH

## 2017-08-24 ENCOUNTER — Encounter: Payer: Self-pay | Admitting: Family Medicine

## 2017-08-24 ENCOUNTER — Ambulatory Visit
Admission: RE | Admit: 2017-08-24 | Discharge: 2017-08-24 | Disposition: A | Discharge status: Home / Self Care | Payer: Medicare Other | Source: Ambulatory Visit | Attending: Family Medicine | Admitting: Family Medicine

## 2017-08-24 ENCOUNTER — Ambulatory Visit (INDEPENDENT_AMBULATORY_CARE_PROVIDER_SITE_OTHER): Payer: Medicare Other | Admitting: Family Medicine

## 2017-08-24 VITALS — BP 102/78 | HR 70 | Temp 98.2°F

## 2017-08-24 DIAGNOSIS — R531 Weakness: Secondary | ICD-10-CM

## 2017-08-24 DIAGNOSIS — R930 Abnormal findings on diagnostic imaging of skull and head, not elsewhere classified: Secondary | ICD-10-CM | POA: Diagnosis not present

## 2017-08-24 DIAGNOSIS — R4781 Slurred speech: Secondary | ICD-10-CM

## 2017-08-24 DIAGNOSIS — H748X2 Other specified disorders of left middle ear and mastoid: Secondary | ICD-10-CM | POA: Diagnosis not present

## 2017-08-24 LAB — COMPREHENSIVE METABOLIC PANEL
ALT: 3 U/L (ref 0–53)
AST: 9 U/L (ref 0–37)
Albumin: 4.2 g/dL (ref 3.5–5.2)
Alkaline Phosphatase: 52 U/L (ref 39–117)
BILIRUBIN TOTAL: 0.6 mg/dL (ref 0.2–1.2)
BUN: 20 mg/dL (ref 6–23)
CO2: 29 meq/L (ref 19–32)
CREATININE: 0.79 mg/dL (ref 0.40–1.50)
Calcium: 9.2 mg/dL (ref 8.4–10.5)
Chloride: 108 mEq/L (ref 96–112)
GFR: 100.55 mL/min (ref >60.00)
GLUCOSE: 100 mg/dL — AB (ref 70–99)
Potassium: 3.7 mEq/L (ref 3.5–5.1)
SODIUM: 145 meq/L (ref 135–145)
Total Protein: 7 g/dL (ref 6.0–8.3)

## 2017-08-24 LAB — CBC WITH DIFFERENTIAL/PLATELET
BASOS ABS: 0 10*3/uL (ref 0.0–0.1)
Basophils Relative: 0.5 % (ref 0.0–3.0)
EOS ABS: 0 10*3/uL (ref 0.0–0.7)
Eosinophils Relative: 0.5 % (ref 0.0–5.0)
HCT: 40.5 % (ref 39.0–52.0)
Hemoglobin: 14.1 g/dL (ref 13.0–17.0)
LYMPHS ABS: 1.1 10*3/uL (ref 0.7–4.0)
Lymphocytes Relative: 16.5 % (ref 12.0–46.0)
MCHC: 34.8 g/dL (ref 30.0–36.0)
MCV: 93.1 fl (ref 78.0–100.0)
MONO ABS: 0.4 10*3/uL (ref 0.1–1.0)
Monocytes Relative: 6.5 % (ref 3.0–12.0)
NEUTROS ABS: 5.2 10*3/uL (ref 1.4–7.7)
NEUTROS PCT: 76 % (ref 43.0–77.0)
PLATELETS: 155 10*3/uL (ref 150.0–400.0)
RBC: 4.35 Mil/uL (ref 4.22–5.81)
RDW: 13.3 % (ref 11.5–15.5)
WBC: 6.8 10*3/uL (ref 4.0–10.5)

## 2017-08-24 NOTE — Progress Notes (Signed)
Subjective:    Patient ID: Alan Peterson, male    DOB: 1940-08-15, 77 y.o.   MRN: [id number]  HPI This is a 77 yo male, brought in by his son, who presents today with 4 days of slurred speech and increased stiffness. Called in to triage line yesterday, patient refused to go to ED. Home health nurse was there yesterday. Has been difficult to determine other symptoms due to Parkinson's. He has drooping of facial features and drooling at baseline. According to patient's son, he was very easy to understand prior to these new symptoms.  No new medications, took a dose of fluconazole for yeast several days ago. Takes weekly as needed.  Prior to recent symptoms, was able to converse well, ambulate with his walker and prepare his own meals.  The patient denies any focal pain, reports difficulty swallowing, hands feel stiff. No chest pain or SOB. Bowels and bladder normal for him. No recent falls.   Past Medical History:  Diagnosis Date  . BPH (benign prostatic hypertrophy)   . Diabetes mellitus   . Hyperlipidemia   . Hypertension   . Parkinson's disease    Past Surgical History:  Procedure Laterality Date  . FINGER CONTRACTURE RELEASE  1980   right, left 4th finger release 8/10   Family History  Problem Relation Age of Onset  . Hypertension Mother   . Coronary artery disease Neg Hx   . Diabetes Neg Hx   . Cancer Neg Hx    Social History   Tobacco Use  . Smoking status: Never Smoker  . Smokeless tobacco: Never Used  Substance Use Topics  . Alcohol use: Yes    Comment: beer  . Drug use: Not on file      Review of Systems Per HPI    Objective:   Physical Exam  Constitutional: No distress.  Sitting in wheelchair. Very kyphotic with neck flexed forward. Constant drool. Difficult to examine due to position of neck.   HENT:  Head: Normocephalic and atraumatic.  Mouth/Throat: Oropharynx is clear and moist.  Eyes: Conjunctivae are normal.  Neck: Decreased range of motion  present.  Cardiovascular: Normal rate, regular rhythm and normal heart sounds.  Pulmonary/Chest: Effort normal and breath sounds normal.  Musculoskeletal: He exhibits edema (bilateral lower extremities 1+).  Neurological: He is alert.  Difficult to understand with slurred speech.  Answers questions appropriately.  UE strength 4/5 bilaterally.  Tongue midline.  No obvious facial asymmetry, has decreased tone of facial muscles.    Skin: Skin is warm and dry. He is not diaphoretic.  Psychiatric: He has a normal mood and affect. His behavior is normal.  Vitals reviewed.     BP 102/78 (BP Location: Right Arm, Patient Position: Sitting, Cuff Size: Normal)   Pulse 97   Temp 98.2 F (36.8 C)   SpO2 (!) 70%      Assessment & Plan:  Discussed with Dr. Para Marchuncan  1. Slurred speech - given this is a new symptom and concern for CVA, will send for stat CT of head - strict ER precautions reviewed- any worsening symptoms go to ER - CT Head Wo Contrast; Future - CBC with Differential - Comprehensive metabolic panel  2. Weakness - as above - CT Head Wo Contrast; Future - CBC with Differential - Comprehensive metabolic panel - POCT Urinalysis Dipstick (Automated)- patient unable to void    Olean Reeeborah Asmar Brozek, FNP-BC  [facility]  08/24/2017 1:34 PM

## 2017-08-24 NOTE — Patient Instructions (Signed)
Good to see you today  We will check some blood work and schedule you for a cat scan of your head to be done today  If you have acute worsening of your symptoms, please go to the emergency room

## 2017-08-31 ENCOUNTER — Ambulatory Visit (INDEPENDENT_AMBULATORY_CARE_PROVIDER_SITE_OTHER): Payer: Medicare Other | Admitting: Internal Medicine

## 2017-08-31 ENCOUNTER — Encounter: Payer: Self-pay | Admitting: Internal Medicine

## 2017-08-31 VITALS — BP 138/80 | HR 68 | Temp 98.6°F | Ht 69.0 in

## 2017-08-31 DIAGNOSIS — G20 Parkinson's disease: Secondary | ICD-10-CM

## 2017-08-31 DIAGNOSIS — L98419 Non-pressure chronic ulcer of buttock with unspecified severity: Secondary | ICD-10-CM | POA: Diagnosis not present

## 2017-08-31 NOTE — Assessment & Plan Note (Addendum)
Clearly worsening Likely cause of speech problems--no clear issue with the CT scan Better now Worsening dysphagia Needs more help at home--but doesn't have the money Home bound---will try home health PT/OT to see if we can improve function

## 2017-08-31 NOTE — Assessment & Plan Note (Signed)
Not clearly infected but could be related to yeast Mostly due to poor hygiene probably Will try hydrogel or other supportive care Needs home health to monitor and treat

## 2017-08-31 NOTE — Progress Notes (Signed)
Subjective:    Patient ID: Alan Peterson, male    DOB: 1940/08/21, 77 y.o.   MRN: [id number]  HPI Here for follow up of new speech problems With son  Speech is more towards normal He subsequently noted his tongue was swollen--probably caused the speech issue No obvious coating or inflammation More trouble swallowing No new exposures other than fluconazole (which he tolerated in past)---so not clear allergic reaction  Doesn't restrict diet---will cut things up well  He has same sores on buttock as before Only using antiseptic spray---had home nurse in the past (through Dr Hyacinth Meeker the neurologist)  Current Outpatient Medications on File Prior to Visit  Medication Sig Dispense Refill  . carbidopa-levodopa (SINEMET IR) 25-100 MG tablet Take 1 tablet by mouth 3 (three) times daily. 270 tablet 3  . fluconazole (DIFLUCAN) 150 MG tablet TAKE 1 TABLET (150 MG TOTAL) BY MOUTH ONCE A WEEK. 4 tablet 5   No current facility-administered medications on file prior to visit.     No Known Allergies  Past Medical History:  Diagnosis Date  . BPH (benign prostatic hypertrophy)   . Diabetes mellitus   . Hyperlipidemia   . Hypertension   . Parkinson's disease     Past Surgical History:  Procedure Laterality Date  . FINGER CONTRACTURE RELEASE  1980   right, left 4th finger release 8/10    Family History  Problem Relation Age of Onset  . Hypertension Mother   . Coronary artery disease Neg Hx   . Diabetes Neg Hx   . Cancer Neg Hx     Social History   Socioeconomic History  . Marital status: Divorced    Spouse name: Not on file  . Number of children: 2  . Years of education: Not on file  . Highest education level: Not on file  Occupational History  . 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 Financial resource strain: Not on file  . Food insecurity:    Worry: Not on file    Inability: Not on file  .  Transportation needs:    Medical: Not on file    Non-medical: Not on file  Tobacco Use  . Smoking status: Never Smoker  . Smokeless tobacco: Never Used  Substance and Sexual Activity  . Alcohol use: Yes    Comment: beer  . Drug use: Not on file  . Sexual activity: Not on file  Lifestyle  . Physical activity:    Days per week: Not on file    Minutes per session: Not on file  . Stress: Not on file  Relationships  . Social connections:    Talks on phone: Not on file    Gets together: Not on file    Attends religious service: Not on file    Active member of club or organization: Not on file    Attends meetings of clubs or organizations: Not on file    Relationship status: Not on file  . Intimate partner violence:    Fear of current or ex partner: Not on file    Emotionally abused: Not on file    Physically abused: Not on file    Forced sexual activity: Not on file  Other Topics Concern  . Not on file  Social History Narrative   [place]      Has a living will "somewhere"   Requests son [name]  to make decisions for him--now has health care POA   Would accept resuscitation attempts   Probably would want tube feeds   Review of Systems  He feels his weight is stabilized but unable to stand on scale Appetite is fair     Objective:   Physical Exam  HENT:  Tongue appears normal No apparent thrush  Neurological:  Drooling Neck flexion--can't extend bradykinesia  Skin:  Hypertrophic areas on mid buttocks near crease Right side has ulcer that is ~2cm deep--not really infected Smaller on left side           Assessment & Plan:

## 2017-09-01 DIAGNOSIS — G20 Parkinson's disease: Secondary | ICD-10-CM | POA: Diagnosis not present

## 2017-09-01 DIAGNOSIS — L98419 Non-pressure chronic ulcer of buttock with unspecified severity: Secondary | ICD-10-CM | POA: Diagnosis not present

## 2017-09-03 DIAGNOSIS — G20 Parkinson's disease: Secondary | ICD-10-CM | POA: Diagnosis not present

## 2017-09-03 DIAGNOSIS — L98419 Non-pressure chronic ulcer of buttock with unspecified severity: Secondary | ICD-10-CM | POA: Diagnosis not present

## 2017-09-04 ENCOUNTER — Telehealth: Payer: Self-pay | Admitting: Internal Medicine

## 2017-09-04 DIAGNOSIS — G20 Parkinson's disease: Secondary | ICD-10-CM | POA: Diagnosis not present

## 2017-09-04 DIAGNOSIS — L98419 Non-pressure chronic ulcer of buttock with unspecified severity: Secondary | ICD-10-CM | POA: Diagnosis not present

## 2017-09-04 NOTE — Telephone Encounter (Signed)
Copied from [id number]. Topic: Quick Communication - See Telephone Encounter >> Sep 04, 2017  1:34 PM Burchel, Abbi R wrote: CRM for notification. See Telephone encounter for: 09/04/17.   Mr. Elvin So [phone])  Requesting V/O for:   OT 2*1wk 1*3wk for ADL, IADL, tranfers, exercise and DC1 goals met or maximum potential

## 2017-09-04 NOTE — Telephone Encounter (Signed)
Left a detailed message on VM with Verbal Orders

## 2017-09-04 NOTE — Telephone Encounter (Signed)
Yes--thank them

## 2017-09-05 ENCOUNTER — Telehealth: Payer: Self-pay | Admitting: Internal Medicine

## 2017-09-05 DIAGNOSIS — L98419 Non-pressure chronic ulcer of buttock with unspecified severity: Secondary | ICD-10-CM | POA: Diagnosis not present

## 2017-09-05 DIAGNOSIS — G20 Parkinson's disease: Secondary | ICD-10-CM | POA: Diagnosis not present

## 2017-09-05 NOTE — Telephone Encounter (Signed)
Kathie RhodesBetty with [facility] called to get permission to use the Hydrogel on pt's wound instead of silver alginate since the would is not draining. Call [phone] with instructions.

## 2017-09-06 DIAGNOSIS — G20 Parkinson's disease: Secondary | ICD-10-CM | POA: Diagnosis not present

## 2017-09-06 DIAGNOSIS — L98419 Non-pressure chronic ulcer of buttock with unspecified severity: Secondary | ICD-10-CM | POA: Diagnosis not present

## 2017-09-06 NOTE — Telephone Encounter (Signed)
That sounds good

## 2017-09-06 NOTE — Telephone Encounter (Signed)
Verbal orders given to [name] 

## 2017-09-07 DIAGNOSIS — G20 Parkinson's disease: Secondary | ICD-10-CM | POA: Diagnosis not present

## 2017-09-07 DIAGNOSIS — L98419 Non-pressure chronic ulcer of buttock with unspecified severity: Secondary | ICD-10-CM | POA: Diagnosis not present

## 2017-09-08 DIAGNOSIS — G20 Parkinson's disease: Secondary | ICD-10-CM | POA: Diagnosis not present

## 2017-09-08 DIAGNOSIS — L98419 Non-pressure chronic ulcer of buttock with unspecified severity: Secondary | ICD-10-CM | POA: Diagnosis not present

## 2017-09-10 DIAGNOSIS — L98419 Non-pressure chronic ulcer of buttock with unspecified severity: Secondary | ICD-10-CM | POA: Diagnosis not present

## 2017-09-10 DIAGNOSIS — G20 Parkinson's disease: Secondary | ICD-10-CM | POA: Diagnosis not present

## 2017-09-11 DIAGNOSIS — L98419 Non-pressure chronic ulcer of buttock with unspecified severity: Secondary | ICD-10-CM | POA: Diagnosis not present

## 2017-09-11 DIAGNOSIS — G20 Parkinson's disease: Secondary | ICD-10-CM | POA: Diagnosis not present

## 2017-09-12 DIAGNOSIS — G20 Parkinson's disease: Secondary | ICD-10-CM | POA: Diagnosis not present

## 2017-09-12 DIAGNOSIS — L98419 Non-pressure chronic ulcer of buttock with unspecified severity: Secondary | ICD-10-CM | POA: Diagnosis not present

## 2017-09-13 DIAGNOSIS — L98419 Non-pressure chronic ulcer of buttock with unspecified severity: Secondary | ICD-10-CM | POA: Diagnosis not present

## 2017-09-13 DIAGNOSIS — G20 Parkinson's disease: Secondary | ICD-10-CM | POA: Diagnosis not present

## 2017-09-14 DIAGNOSIS — L98419 Non-pressure chronic ulcer of buttock with unspecified severity: Secondary | ICD-10-CM | POA: Diagnosis not present

## 2017-09-14 DIAGNOSIS — G20 Parkinson's disease: Secondary | ICD-10-CM | POA: Diagnosis not present

## 2017-09-17 DIAGNOSIS — G20 Parkinson's disease: Secondary | ICD-10-CM | POA: Diagnosis not present

## 2017-09-17 DIAGNOSIS — L98419 Non-pressure chronic ulcer of buttock with unspecified severity: Secondary | ICD-10-CM | POA: Diagnosis not present

## 2017-09-18 DIAGNOSIS — L98419 Non-pressure chronic ulcer of buttock with unspecified severity: Secondary | ICD-10-CM | POA: Diagnosis not present

## 2017-09-18 DIAGNOSIS — G20 Parkinson's disease: Secondary | ICD-10-CM | POA: Diagnosis not present

## 2017-09-19 DIAGNOSIS — L98419 Non-pressure chronic ulcer of buttock with unspecified severity: Secondary | ICD-10-CM | POA: Diagnosis not present

## 2017-09-19 DIAGNOSIS — G20 Parkinson's disease: Secondary | ICD-10-CM | POA: Diagnosis not present

## 2017-09-21 DIAGNOSIS — I872 Venous insufficiency (chronic) (peripheral): Secondary | ICD-10-CM | POA: Diagnosis not present

## 2017-09-21 DIAGNOSIS — E1142 Type 2 diabetes mellitus with diabetic polyneuropathy: Secondary | ICD-10-CM | POA: Diagnosis not present

## 2017-09-21 DIAGNOSIS — G20 Parkinson's disease: Secondary | ICD-10-CM | POA: Diagnosis not present

## 2017-09-21 DIAGNOSIS — E441 Mild protein-calorie malnutrition: Secondary | ICD-10-CM

## 2017-09-21 DIAGNOSIS — M40202 Unspecified kyphosis, cervical region: Secondary | ICD-10-CM | POA: Diagnosis not present

## 2017-09-21 DIAGNOSIS — Z9181 History of falling: Secondary | ICD-10-CM

## 2017-09-21 DIAGNOSIS — I10 Essential (primary) hypertension: Secondary | ICD-10-CM | POA: Diagnosis not present

## 2017-09-21 DIAGNOSIS — E785 Hyperlipidemia, unspecified: Secondary | ICD-10-CM | POA: Diagnosis not present

## 2017-09-21 DIAGNOSIS — L98419 Non-pressure chronic ulcer of buttock with unspecified severity: Secondary | ICD-10-CM | POA: Diagnosis not present

## 2017-09-21 DIAGNOSIS — N401 Enlarged prostate with lower urinary tract symptoms: Secondary | ICD-10-CM | POA: Diagnosis not present

## 2017-09-24 ENCOUNTER — Telehealth: Payer: Self-pay | Admitting: Internal Medicine

## 2017-09-24 DIAGNOSIS — G20 Parkinson's disease: Secondary | ICD-10-CM | POA: Diagnosis not present

## 2017-09-24 DIAGNOSIS — L98419 Non-pressure chronic ulcer of buttock with unspecified severity: Secondary | ICD-10-CM | POA: Diagnosis not present

## 2017-09-24 NOTE — Telephone Encounter (Signed)
That is fine 

## 2017-09-24 NOTE — Telephone Encounter (Signed)
Verbal orders given to Don  

## 2017-09-24 NOTE — Telephone Encounter (Signed)
Don [phone] called [facility] regarding to request ok to extent OT for 2 visit for shower and toilet transfers and meal prep. 1 x a week for 2weeks and continue with plan of care. Please advise. Thank you!

## 2017-09-26 ENCOUNTER — Ambulatory Visit: Payer: Medicare Other | Admitting: Internal Medicine

## 2017-09-26 DIAGNOSIS — G20 Parkinson's disease: Secondary | ICD-10-CM | POA: Diagnosis not present

## 2017-09-26 DIAGNOSIS — L98419 Non-pressure chronic ulcer of buttock with unspecified severity: Secondary | ICD-10-CM | POA: Diagnosis not present

## 2017-09-27 DIAGNOSIS — G20 Parkinson's disease: Secondary | ICD-10-CM | POA: Diagnosis not present

## 2017-09-27 DIAGNOSIS — L98419 Non-pressure chronic ulcer of buttock with unspecified severity: Secondary | ICD-10-CM | POA: Diagnosis not present

## 2017-09-28 ENCOUNTER — Telehealth: Payer: Self-pay | Admitting: Internal Medicine

## 2017-09-28 DIAGNOSIS — G20 Parkinson's disease: Secondary | ICD-10-CM | POA: Diagnosis not present

## 2017-09-28 DIAGNOSIS — L98419 Non-pressure chronic ulcer of buttock with unspecified severity: Secondary | ICD-10-CM | POA: Diagnosis not present

## 2017-09-28 NOTE — Telephone Encounter (Signed)
Verbal orders given to [name] 

## 2017-09-28 NOTE — Telephone Encounter (Signed)
Okay to proceed with this schedule

## 2017-09-28 NOTE — Telephone Encounter (Signed)
Copied from CRM [id number]. Topic: General - Other >> Sep 28, 2017 11:42 AM Leafy Roobinson, Norma J wrote: Reason for CRM: betty rn [facility] is calling and would like nursing 3 times a wk for 3 wks and change from hydro gel to silver gel

## 2017-10-01 DIAGNOSIS — G20 Parkinson's disease: Secondary | ICD-10-CM | POA: Diagnosis not present

## 2017-10-01 DIAGNOSIS — L98419 Non-pressure chronic ulcer of buttock with unspecified severity: Secondary | ICD-10-CM | POA: Diagnosis not present

## 2017-10-03 DIAGNOSIS — G20 Parkinson's disease: Secondary | ICD-10-CM | POA: Diagnosis not present

## 2017-10-03 DIAGNOSIS — L98419 Non-pressure chronic ulcer of buttock with unspecified severity: Secondary | ICD-10-CM | POA: Diagnosis not present

## 2017-10-05 DIAGNOSIS — G20 Parkinson's disease: Secondary | ICD-10-CM | POA: Diagnosis not present

## 2017-10-05 DIAGNOSIS — L98419 Non-pressure chronic ulcer of buttock with unspecified severity: Secondary | ICD-10-CM | POA: Diagnosis not present

## 2017-10-08 DIAGNOSIS — L98419 Non-pressure chronic ulcer of buttock with unspecified severity: Secondary | ICD-10-CM | POA: Diagnosis not present

## 2017-10-08 DIAGNOSIS — G20 Parkinson's disease: Secondary | ICD-10-CM | POA: Diagnosis not present

## 2017-10-10 DIAGNOSIS — G20 Parkinson's disease: Secondary | ICD-10-CM | POA: Diagnosis not present

## 2017-10-10 DIAGNOSIS — L98419 Non-pressure chronic ulcer of buttock with unspecified severity: Secondary | ICD-10-CM | POA: Diagnosis not present

## 2017-10-12 DIAGNOSIS — G20 Parkinson's disease: Secondary | ICD-10-CM | POA: Diagnosis not present

## 2017-10-12 DIAGNOSIS — L98419 Non-pressure chronic ulcer of buttock with unspecified severity: Secondary | ICD-10-CM | POA: Diagnosis not present

## 2017-10-15 DIAGNOSIS — L98419 Non-pressure chronic ulcer of buttock with unspecified severity: Secondary | ICD-10-CM | POA: Diagnosis not present

## 2017-10-15 DIAGNOSIS — G20 Parkinson's disease: Secondary | ICD-10-CM | POA: Diagnosis not present

## 2017-10-17 DIAGNOSIS — G20 Parkinson's disease: Secondary | ICD-10-CM | POA: Diagnosis not present

## 2017-10-17 DIAGNOSIS — L98419 Non-pressure chronic ulcer of buttock with unspecified severity: Secondary | ICD-10-CM | POA: Diagnosis not present

## 2017-11-26 ENCOUNTER — Telehealth: Payer: Self-pay | Admitting: Internal Medicine

## 2017-11-26 DIAGNOSIS — L98419 Non-pressure chronic ulcer of buttock with unspecified severity: Secondary | ICD-10-CM

## 2017-11-26 DIAGNOSIS — G20 Parkinson's disease: Secondary | ICD-10-CM

## 2017-11-26 NOTE — Telephone Encounter (Signed)
Copied from CRM [id number]. Topic: Inquiry >> Nov 26, 2017  3:21 PM Crist InfanteHarrald, Kathy J wrote: Reason for CRM: ex wife calling to get a script for PT and wound care renewed for the pt. She states [facility] told [facility] to call the [facility]. It must come from the nurse. I do not see the Ex wife is on the DPR in order to speak with.

## 2017-11-26 NOTE — Telephone Encounter (Signed)
Check with him Medicare will not keep paying for PT unless there is continued improvement and significant disability I have not seen him since the last time so we will probably need an office visit to decide about where he is with the moving around as well as what the ulcer looks like

## 2017-11-27 NOTE — Telephone Encounter (Signed)
He may not think so ---but there are strict Medicare regulations about home health care He may be just on the cusp of being able to call them back in---- but we will need a visit within a month to truly qualify for ongoing payment to the home health

## 2017-11-27 NOTE — Telephone Encounter (Signed)
Spoke to pt's son. Explained the message below from Dr Alphonsus SiasLetvak. He said it is hard to get the pt in. He will try to see what he can do.

## 2017-11-27 NOTE — Telephone Encounter (Signed)
Spoke to pt's son on [place]. He said the pt showed great improvement with PT and the wound had been doing better. He reopened the wound 2 days ago. They are concerned with it getting worse. I told him what Dr Alphonsus SiasLetvak had stated about being seen and his son said he did not think that was necessary to make an OV just for this. The orders should be able to be repeated.

## 2017-11-27 NOTE — Telephone Encounter (Signed)
Left message to talk to pt.

## 2017-12-01 DIAGNOSIS — G20 Parkinson's disease: Secondary | ICD-10-CM | POA: Diagnosis not present

## 2017-12-01 DIAGNOSIS — L89322 Pressure ulcer of left buttock, stage 2: Secondary | ICD-10-CM | POA: Diagnosis not present

## 2017-12-03 ENCOUNTER — Telehealth: Payer: Self-pay | Admitting: Internal Medicine

## 2017-12-03 NOTE — Telephone Encounter (Signed)
Copied from CRM [phone]. Topic: General - Other >> Dec 03, 2017 11:32 AM Marylen PontoMcneil, Ja-Kwan wrote: Reason for CRM: Kathie RhodesBetty with [facility] and Physical Therapy. Cb# [phone]

## 2017-12-03 NOTE — Telephone Encounter (Signed)
Molli KnockOkay It has been over 3 months so we need to be sure he has an appt within 1 month

## 2017-12-03 NOTE — Telephone Encounter (Signed)
Patient already has appointment on 12/07/17.

## 2017-12-04 DIAGNOSIS — L89322 Pressure ulcer of left buttock, stage 2: Secondary | ICD-10-CM | POA: Diagnosis not present

## 2017-12-04 DIAGNOSIS — G20 Parkinson's disease: Secondary | ICD-10-CM | POA: Diagnosis not present

## 2017-12-05 DIAGNOSIS — G20 Parkinson's disease: Secondary | ICD-10-CM | POA: Diagnosis not present

## 2017-12-05 DIAGNOSIS — L89322 Pressure ulcer of left buttock, stage 2: Secondary | ICD-10-CM | POA: Diagnosis not present

## 2017-12-07 ENCOUNTER — Ambulatory Visit (INDEPENDENT_AMBULATORY_CARE_PROVIDER_SITE_OTHER): Payer: Medicare Other | Admitting: Internal Medicine

## 2017-12-07 ENCOUNTER — Encounter: Payer: Self-pay | Admitting: Internal Medicine

## 2017-12-07 ENCOUNTER — Encounter (INDEPENDENT_AMBULATORY_CARE_PROVIDER_SITE_OTHER): Payer: Self-pay

## 2017-12-07 ENCOUNTER — Ambulatory Visit: Payer: Medicare Other | Admitting: Internal Medicine

## 2017-12-07 VITALS — BP 116/68 | HR 72 | Temp 98.3°F | Ht 69.0 in | Wt 156.0 lb

## 2017-12-07 DIAGNOSIS — L98419 Non-pressure chronic ulcer of buttock with unspecified severity: Secondary | ICD-10-CM | POA: Diagnosis not present

## 2017-12-07 DIAGNOSIS — G20 Parkinson's disease: Secondary | ICD-10-CM

## 2017-12-07 DIAGNOSIS — E441 Mild protein-calorie malnutrition: Secondary | ICD-10-CM

## 2017-12-07 NOTE — Assessment & Plan Note (Signed)
Now healed again I suspect repetitive shearing action is responsible for the recurrences Discussed local care and padding for protection

## 2017-12-07 NOTE — Assessment & Plan Note (Signed)
Continues to lose weight Down 20# in the past year Asked him to add ensure/boost daily in between meals

## 2017-12-07 NOTE — Progress Notes (Signed)
Subjective:    Patient ID: Alan Peterson, male    DOB: Jan 31, 1941, 77 y.o.   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 carbidopa-levodopa (SINEMET IR) 25-100 MG tablet Take 1 tablet by mouth 3 (three) times daily. 270 tablet 3   No current facility-administered medications on file prior to visit.     No Known Allergies  Past Medical History:  Diagnosis Date  . BPH (benign prostatic hypertrophy)   . Diabetes mellitus   . Hyperlipidemia   . Hypertension   . Parkinson's disease     Past Surgical History:  Procedure Laterality Date  . FINGER CONTRACTURE RELEASE  1980   right, left 4th finger release 8/10    Family History  Problem Relation Age of Onset  . Hypertension Mother   . Coronary artery disease Neg Hx   . Diabetes Neg Hx   . Cancer Neg Hx     Social History   Socioeconomic History  . Marital status: Divorced    Spouse name: Not on file  . Number of children: 2  . Years of education: Not on file  . Highest education level: Not on file  Occupational History  . 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 Financial resource strain: Not on file  . Food insecurity:    Worry: Not on file    Inability: Not on file  . Transportation needs:    Medical: Not on file    Non-medical: Not on file  Tobacco Use  . Smoking status: Never  Smoker  . Smokeless tobacco: Never Used  Substance and Sexual Activity  . Alcohol use: Yes    Comment: beer  . Drug use: Not on file  . Sexual activity: Not on file  Lifestyle  . Physical activity:    Days per week: Not on file    Minutes per session: Not on file  . Stress: Not on file  Relationships  . Social connections:    Talks on phone: Not on file    Gets together: Not on file    Attends religious service: Not on file    Active member of club or organization: Not on file    Attends meetings of clubs or organizations: Not on file    Relationship status: Not on file  . Intimate partner violence:    Fear of current or ex partner: Not on file    Emotionally abused: Not on file    Physically abused: Not on file    Forced sexual activity: Not on file  Other Topics Concern  . Not on file  Social History Narrative   [place]      Has a living will "somewhere"   Requests son Alan Peterson to make decisions for him--now has health care POA   Would accept resuscitation attempts   Probably would want tube feeds  Review of Systems No fever Not sick Appetite is okay---but continues to lose weight    Objective:   Physical Exam  Constitutional: No distress.  Neurological:  Neck contracted Drooling Bradykinesia and some increased tone  Skin:  Buttocks wound is closed again and reepithelialized  No inflammation           Assessment & Plan:

## 2017-12-07 NOTE — Assessment & Plan Note (Signed)
Ongoing decline Has responded to the home therapy program Will need to keep up more aggressive HEP once they are done

## 2017-12-10 DIAGNOSIS — N401 Enlarged prostate with lower urinary tract symptoms: Secondary | ICD-10-CM

## 2017-12-10 DIAGNOSIS — E441 Mild protein-calorie malnutrition: Secondary | ICD-10-CM | POA: Diagnosis not present

## 2017-12-10 DIAGNOSIS — G20 Parkinson's disease: Secondary | ICD-10-CM | POA: Diagnosis not present

## 2017-12-10 DIAGNOSIS — Z9181 History of falling: Secondary | ICD-10-CM

## 2017-12-10 DIAGNOSIS — K219 Gastro-esophageal reflux disease without esophagitis: Secondary | ICD-10-CM | POA: Diagnosis not present

## 2017-12-10 DIAGNOSIS — I10 Essential (primary) hypertension: Secondary | ICD-10-CM | POA: Diagnosis not present

## 2017-12-10 DIAGNOSIS — I872 Venous insufficiency (chronic) (peripheral): Secondary | ICD-10-CM

## 2017-12-10 DIAGNOSIS — E785 Hyperlipidemia, unspecified: Secondary | ICD-10-CM

## 2017-12-10 DIAGNOSIS — E1151 Type 2 diabetes mellitus with diabetic peripheral angiopathy without gangrene: Secondary | ICD-10-CM

## 2017-12-10 DIAGNOSIS — L89322 Pressure ulcer of left buttock, stage 2: Secondary | ICD-10-CM | POA: Diagnosis not present

## 2017-12-10 DIAGNOSIS — E1142 Type 2 diabetes mellitus with diabetic polyneuropathy: Secondary | ICD-10-CM | POA: Diagnosis not present

## 2017-12-10 DIAGNOSIS — M40202 Unspecified kyphosis, cervical region: Secondary | ICD-10-CM | POA: Diagnosis not present

## 2017-12-10 DIAGNOSIS — R634 Abnormal weight loss: Secondary | ICD-10-CM | POA: Diagnosis not present

## 2017-12-11 DIAGNOSIS — L89322 Pressure ulcer of left buttock, stage 2: Secondary | ICD-10-CM | POA: Diagnosis not present

## 2017-12-11 DIAGNOSIS — G20 Parkinson's disease: Secondary | ICD-10-CM | POA: Diagnosis not present

## 2017-12-13 DIAGNOSIS — G20 Parkinson's disease: Secondary | ICD-10-CM | POA: Diagnosis not present

## 2017-12-13 DIAGNOSIS — L89322 Pressure ulcer of left buttock, stage 2: Secondary | ICD-10-CM | POA: Diagnosis not present

## 2017-12-18 DIAGNOSIS — L89322 Pressure ulcer of left buttock, stage 2: Secondary | ICD-10-CM | POA: Diagnosis not present

## 2017-12-18 DIAGNOSIS — G20 Parkinson's disease: Secondary | ICD-10-CM | POA: Diagnosis not present

## 2017-12-19 DIAGNOSIS — G20 Parkinson's disease: Secondary | ICD-10-CM | POA: Diagnosis not present

## 2017-12-19 DIAGNOSIS — L89322 Pressure ulcer of left buttock, stage 2: Secondary | ICD-10-CM | POA: Diagnosis not present

## 2017-12-31 DIAGNOSIS — L89322 Pressure ulcer of left buttock, stage 2: Secondary | ICD-10-CM | POA: Diagnosis not present

## 2017-12-31 DIAGNOSIS — G20 Parkinson's disease: Secondary | ICD-10-CM | POA: Diagnosis not present

## 2018-01-02 DIAGNOSIS — L89322 Pressure ulcer of left buttock, stage 2: Secondary | ICD-10-CM | POA: Diagnosis not present

## 2018-01-02 DIAGNOSIS — G20 Parkinson's disease: Secondary | ICD-10-CM | POA: Diagnosis not present

## 2018-01-04 DIAGNOSIS — L89322 Pressure ulcer of left buttock, stage 2: Secondary | ICD-10-CM | POA: Diagnosis not present

## 2018-01-04 DIAGNOSIS — G20 Parkinson's disease: Secondary | ICD-10-CM | POA: Diagnosis not present

## 2018-01-08 DIAGNOSIS — G20 Parkinson's disease: Secondary | ICD-10-CM | POA: Diagnosis not present

## 2018-01-08 DIAGNOSIS — L89322 Pressure ulcer of left buttock, stage 2: Secondary | ICD-10-CM | POA: Diagnosis not present

## 2018-01-15 ENCOUNTER — Telehealth: Payer: Self-pay | Admitting: Internal Medicine

## 2018-01-15 DIAGNOSIS — G20 Parkinson's disease: Secondary | ICD-10-CM | POA: Diagnosis not present

## 2018-01-15 DIAGNOSIS — L89322 Pressure ulcer of left buttock, stage 2: Secondary | ICD-10-CM | POA: Diagnosis not present

## 2018-01-15 NOTE — Telephone Encounter (Signed)
[facility] calling to get physical therapy orders 1 x a week for 2 weeks for continuous physical therapy. Gait Training and balance training. Please call Mel with verbal orders.

## 2018-01-16 ENCOUNTER — Telehealth: Payer: Self-pay | Admitting: Internal Medicine

## 2018-01-16 DIAGNOSIS — G20 Parkinson's disease: Secondary | ICD-10-CM | POA: Diagnosis not present

## 2018-01-16 DIAGNOSIS — L89322 Pressure ulcer of left buttock, stage 2: Secondary | ICD-10-CM | POA: Diagnosis not present

## 2018-01-16 NOTE — Telephone Encounter (Signed)
That is fine 

## 2018-01-16 NOTE — Telephone Encounter (Signed)
Verbal orders given to Mel  

## 2018-01-16 NOTE — Telephone Encounter (Signed)
Kathie Rhodes at [facility] is requesting verbal orders to apply Silver Gel and foam dressing 2 x a week for 2 weeks. The ulcers on left buttock have reopened. Please call Kathie Rhodes with verbal orders.

## 2018-01-16 NOTE — Telephone Encounter (Signed)
Okay Will need to recheck it in office if worsens

## 2018-01-16 NOTE — Telephone Encounter (Signed)
Verbal orders given to [facility]. She will have them make an OV if no improvement.

## 2018-01-19 DIAGNOSIS — G20 Parkinson's disease: Secondary | ICD-10-CM | POA: Diagnosis not present

## 2018-01-19 DIAGNOSIS — L89322 Pressure ulcer of left buttock, stage 2: Secondary | ICD-10-CM | POA: Diagnosis not present

## 2018-01-22 ENCOUNTER — Telehealth: Payer: Self-pay

## 2018-01-22 DIAGNOSIS — G20 Parkinson's disease: Secondary | ICD-10-CM | POA: Diagnosis not present

## 2018-01-22 DIAGNOSIS — L89322 Pressure ulcer of left buttock, stage 2: Secondary | ICD-10-CM | POA: Diagnosis not present

## 2018-01-22 NOTE — Telephone Encounter (Signed)
Kathie RhodesBetty with [facility] called asking for verbal orders to send medical social worker to the patient for community resources that may be available to the patient. CB is [phone].

## 2018-01-23 DIAGNOSIS — G20 Parkinson's disease: Secondary | ICD-10-CM | POA: Diagnosis not present

## 2018-01-23 DIAGNOSIS — L89322 Pressure ulcer of left buttock, stage 2: Secondary | ICD-10-CM | POA: Diagnosis not present

## 2018-01-23 NOTE — Telephone Encounter (Signed)
That sounds like a good idea---he is going to need increasing care

## 2018-01-23 NOTE — Telephone Encounter (Signed)
[facility]

## 2018-01-25 DIAGNOSIS — L89322 Pressure ulcer of left buttock, stage 2: Secondary | ICD-10-CM | POA: Diagnosis not present

## 2018-01-25 DIAGNOSIS — G20 Parkinson's disease: Secondary | ICD-10-CM | POA: Diagnosis not present

## 2018-01-28 DIAGNOSIS — G20 Parkinson's disease: Secondary | ICD-10-CM | POA: Diagnosis not present

## 2018-01-28 DIAGNOSIS — L89322 Pressure ulcer of left buttock, stage 2: Secondary | ICD-10-CM | POA: Diagnosis not present

## 2018-01-30 DIAGNOSIS — G20 Parkinson's disease: Secondary | ICD-10-CM | POA: Diagnosis not present

## 2018-01-30 DIAGNOSIS — L89322 Pressure ulcer of left buttock, stage 2: Secondary | ICD-10-CM | POA: Diagnosis not present

## 2018-02-01 DIAGNOSIS — G20 Parkinson's disease: Secondary | ICD-10-CM | POA: Diagnosis not present

## 2018-02-01 DIAGNOSIS — L89322 Pressure ulcer of left buttock, stage 2: Secondary | ICD-10-CM | POA: Diagnosis not present

## 2018-02-05 DIAGNOSIS — L89322 Pressure ulcer of left buttock, stage 2: Secondary | ICD-10-CM | POA: Diagnosis not present

## 2018-02-05 DIAGNOSIS — G20 Parkinson's disease: Secondary | ICD-10-CM | POA: Diagnosis not present

## 2018-02-07 DIAGNOSIS — G20 Parkinson's disease: Secondary | ICD-10-CM | POA: Diagnosis not present

## 2018-02-07 DIAGNOSIS — L89322 Pressure ulcer of left buttock, stage 2: Secondary | ICD-10-CM | POA: Diagnosis not present

## 2018-02-08 DIAGNOSIS — L89322 Pressure ulcer of left buttock, stage 2: Secondary | ICD-10-CM | POA: Diagnosis not present

## 2018-02-08 DIAGNOSIS — G20 Parkinson's disease: Secondary | ICD-10-CM | POA: Diagnosis not present

## 2018-02-11 DIAGNOSIS — E1151 Type 2 diabetes mellitus with diabetic peripheral angiopathy without gangrene: Secondary | ICD-10-CM | POA: Diagnosis not present

## 2018-02-11 DIAGNOSIS — Z9181 History of falling: Secondary | ICD-10-CM

## 2018-02-11 DIAGNOSIS — M40202 Unspecified kyphosis, cervical region: Secondary | ICD-10-CM

## 2018-02-11 DIAGNOSIS — E1142 Type 2 diabetes mellitus with diabetic polyneuropathy: Secondary | ICD-10-CM | POA: Diagnosis not present

## 2018-02-11 DIAGNOSIS — G20 Parkinson's disease: Secondary | ICD-10-CM | POA: Diagnosis not present

## 2018-02-11 DIAGNOSIS — I10 Essential (primary) hypertension: Secondary | ICD-10-CM | POA: Diagnosis not present

## 2018-02-11 DIAGNOSIS — E785 Hyperlipidemia, unspecified: Secondary | ICD-10-CM

## 2018-02-11 DIAGNOSIS — L89322 Pressure ulcer of left buttock, stage 2: Secondary | ICD-10-CM | POA: Diagnosis not present

## 2018-02-11 DIAGNOSIS — N401 Enlarged prostate with lower urinary tract symptoms: Secondary | ICD-10-CM | POA: Diagnosis not present

## 2018-02-11 DIAGNOSIS — K219 Gastro-esophageal reflux disease without esophagitis: Secondary | ICD-10-CM | POA: Diagnosis not present

## 2018-02-11 DIAGNOSIS — E441 Mild protein-calorie malnutrition: Secondary | ICD-10-CM | POA: Diagnosis not present

## 2018-02-11 DIAGNOSIS — R634 Abnormal weight loss: Secondary | ICD-10-CM

## 2018-02-11 DIAGNOSIS — I872 Venous insufficiency (chronic) (peripheral): Secondary | ICD-10-CM | POA: Diagnosis not present

## 2018-02-12 DIAGNOSIS — L89322 Pressure ulcer of left buttock, stage 2: Secondary | ICD-10-CM | POA: Diagnosis not present

## 2018-02-12 DIAGNOSIS — G20 Parkinson's disease: Secondary | ICD-10-CM | POA: Diagnosis not present

## 2018-02-15 DIAGNOSIS — L89322 Pressure ulcer of left buttock, stage 2: Secondary | ICD-10-CM | POA: Diagnosis not present

## 2018-02-15 DIAGNOSIS — G20 Parkinson's disease: Secondary | ICD-10-CM | POA: Diagnosis not present

## 2018-02-18 DIAGNOSIS — G20 Parkinson's disease: Secondary | ICD-10-CM | POA: Diagnosis not present

## 2018-02-18 DIAGNOSIS — L89322 Pressure ulcer of left buttock, stage 2: Secondary | ICD-10-CM | POA: Diagnosis not present

## 2018-02-19 DIAGNOSIS — L89322 Pressure ulcer of left buttock, stage 2: Secondary | ICD-10-CM | POA: Diagnosis not present

## 2018-02-19 DIAGNOSIS — G20 Parkinson's disease: Secondary | ICD-10-CM | POA: Diagnosis not present

## 2018-02-22 DIAGNOSIS — L89322 Pressure ulcer of left buttock, stage 2: Secondary | ICD-10-CM | POA: Diagnosis not present

## 2018-02-22 DIAGNOSIS — G20 Parkinson's disease: Secondary | ICD-10-CM | POA: Diagnosis not present

## 2018-02-27 DIAGNOSIS — G20 Parkinson's disease: Secondary | ICD-10-CM | POA: Diagnosis not present

## 2018-02-27 DIAGNOSIS — L89322 Pressure ulcer of left buttock, stage 2: Secondary | ICD-10-CM | POA: Diagnosis not present

## 2018-02-28 DIAGNOSIS — G20 Parkinson's disease: Secondary | ICD-10-CM | POA: Diagnosis not present

## 2018-02-28 DIAGNOSIS — L89322 Pressure ulcer of left buttock, stage 2: Secondary | ICD-10-CM | POA: Diagnosis not present

## 2018-05-30 ENCOUNTER — Telehealth: Payer: Self-pay | Admitting: Internal Medicine

## 2018-05-30 MED ORDER — SILVER SULFADIAZINE 1 % EX CREA: 1.0000 "application " | TOPICAL_CREAM | Freq: Every day | CUTANEOUS | 1 refills | Status: DC

## 2018-05-30 NOTE — Telephone Encounter (Signed)
Patient's son,[name],called.  Patient has pressure point sore on his bottom from sitting.  [name] said it's happened several times before and [facility] has come to the home to do wound care.  [name] wants to know if an order can be sent to [facility] for wound care.  Patient has a awv scheduled with Dr.Letvak on 06/07/18. If patient needs to be seen, he wants to know if you want his cpx moved to a closer date.  [name] is concerned about patient coming in to the office with the Coronavirus.

## 2018-05-30 NOTE — Telephone Encounter (Signed)
I agree with limiting exposure and we will be postponing routine care starting next week anyway. Regulations require a face to face visit for home health services so I am not sure if I can order it without seeing him Does he know of a cream they were using (I often use silvadene if not sulfa allergic)

## 2018-05-30 NOTE — Telephone Encounter (Signed)
Spoke to pt's son. 

## 2018-05-30 NOTE — Telephone Encounter (Signed)
No mention of changing the rules about face to face visits. Have him try the silvadene cream ---apply a thick layer daily and replace if it gets wiped off

## 2018-05-30 NOTE — Telephone Encounter (Signed)
Spoke to pt's son. He does not know what they used. He said he can Facetime on the phone if needed.

## 2018-06-07 ENCOUNTER — Encounter: Payer: BLUE CROSS/BLUE SHIELD | Admitting: Internal Medicine

## 2018-07-03 ENCOUNTER — Encounter: Payer: Self-pay | Admitting: Internal Medicine

## 2018-07-03 ENCOUNTER — Ambulatory Visit (INDEPENDENT_AMBULATORY_CARE_PROVIDER_SITE_OTHER): Payer: Medicare Other | Admitting: Internal Medicine

## 2018-07-03 DIAGNOSIS — L89302 Pressure ulcer of unspecified buttock, stage 2: Secondary | ICD-10-CM | POA: Insufficient documentation

## 2018-07-03 DIAGNOSIS — L89322 Pressure ulcer of left buttock, stage 2: Secondary | ICD-10-CM

## 2018-07-03 DIAGNOSIS — E441 Mild protein-calorie malnutrition: Secondary | ICD-10-CM

## 2018-07-03 DIAGNOSIS — G20 Parkinson's disease: Secondary | ICD-10-CM | POA: Diagnosis not present

## 2018-07-03 MED ORDER — CEPHALEXIN 500 MG PO CAPS: 500.0000 mg | ORAL_CAPSULE | Freq: Three times a day (TID) | ORAL | 1 refills | Status: AC

## 2018-07-03 MED ORDER — SILVER SULFADIAZINE 1 % EX CREA: 1.0000 "application " | TOPICAL_CREAM | Freq: Every day | CUTANEOUS | 3 refills | Status: DC

## 2018-07-03 NOTE — Progress Notes (Signed)
Subjective:    Patient ID: Alan Peterson, male    DOB: [birth year], 78 y.o.   MRN: [id number]  HPI Virtual visit for evaluation of worsening pressure sores Identification done Reviewed billing and consent given He is in his home with his aide Toniann Fail, son is also there---I am in my office Son provides the majority of the information  He has been having visits from [facility] care Son is managing his wounds   The main ulcer is on left buttock Last night it started looking like a blister Red, "blood blister" looking Split on the side and started bleeding The other side in the same spot---had smaller lesion. This is better today Son has been using the silvadene (original lesion did heal with this) This spot is new though (over the pink, sore areas as a preventative)  Current Outpatient Medications on File Prior to Visit  Medication Sig Dispense Refill   carbidopa-levodopa (SINEMET IR) 25-100 MG tablet Take 1 tablet by mouth 3 (three) times daily. 270 tablet 3   silver sulfADIAZINE (SILVADENE) 1 % cream Apply 1 application topically daily. 50 g 1   No current facility-administered medications on file prior to visit.     No Known Allergies  Past Medical History:  Diagnosis Date   BPH (benign prostatic hypertrophy)    Diabetes mellitus    Hyperlipidemia    Hypertension    Parkinson's disease     Past Surgical History:  Procedure Laterality Date   FINGER CONTRACTURE RELEASE  1980   right, left 4th finger release 8/10    Family History  Problem Relation Age of Onset   Hypertension Mother    Coronary artery disease Neg Hx    Diabetes Neg Hx    Cancer Neg Hx     Social History   Socioeconomic History   Marital status: Divorced    Spouse name: Not on file   Number of children: 2   Years of education: Not on file   Highest education level: Not on file  Occupational History   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, physiological for Designer, fashion/clothing)      Employer: [employer]  Social Needs   Financial resource strain: Not on file   Food insecurity:    Worry: Not on file    Inability: Not on file   Transportation needs:    Medical: Not on file    Non-medical: Not on file  Tobacco Use   Smoking status: Never Smoker   Smokeless tobacco: Never Used  Substance and Sexual Activity   Alcohol use: Yes    Comment: beer   Drug use: Not on file   Sexual activity: Not on file  Lifestyle   Physical activity:    Days per week: Not on file    Minutes per session: Not on file   Stress: Not on file  Relationships   Social connections:    Talks on phone: Not on file    Gets together: Not on file    Attends religious service: Not on file    Active member of club or organization: Not on file    Attends meetings of clubs or organizations: Not on file    Relationship status: Not on file   Intimate partner violence:    Fear of current or ex partner: Not on file    Emotionally abused: Not on file    Physically abused: Not on file    Forced sexual activity: Not on file  Other Topics  Concern   Not on file  Social History Narrative   Son [place] with him [place] the farm      Has a living will "somewhere"   Requests son [name] to make decisions for him--now has health care POA   Would accept resuscitation attempts   Probably would want tube feeds   Review of Systems No fever Not moving around as much--he decided to stop his sinemet 2 weeks ago. Now started it again Appetite is okay Not eating great though---weight still down from his historic norms    Objective:   Physical Exam  Constitutional: No distress.  Musculoskeletal:     Comments: Slight pedal edema  Skin:  2 open areas on mid left buttock right at baseline These are new---some redness. No discharge currently It looks chronically inflamed across both buttocks (dull redness which is stable)            Assessment & Plan:

## 2018-07-03 NOTE — Assessment & Plan Note (Signed)
Still not eating great They are trying to stimulate him Consider supplements

## 2018-07-03 NOTE — Assessment & Plan Note (Signed)
Very limited in function even with the sinemet He is back on it again

## 2018-07-03 NOTE — Assessment & Plan Note (Signed)
Likely occurred due to decreased movement when he stopped the sinemet Back on it but still doesn't move much Hard to tell but I believe there is some degree of infection  Discussed getting a pressure reduction insert for his chair and readjusting his position as much as possible Will treat with 5 days of cephalexin and discussed gentle cleansing daily by son followed by the silvadene Discussed warning signs of worsening---ulcers getting larger or tunneling, or general decline in his condition/appetite, etc

## 2018-07-15 ENCOUNTER — Telehealth: Payer: Self-pay

## 2018-07-15 DIAGNOSIS — G20 Parkinson's disease: Secondary | ICD-10-CM

## 2018-07-15 NOTE — Telephone Encounter (Signed)
Referral placed.

## 2018-07-15 NOTE — Telephone Encounter (Signed)
[name] (DPR signed) left v/m that pt had virtual visit 07/03/18 and pt said discussed with Dr Alphonsus Sias about getting [facility] PT and Alan Peterson was not sure if Dr Alphonsus Sias was going to put in order for [facility] PT. Alan Peterson is requesting [facility] PT.Please advise.[name] request cb.

## 2018-07-17 ENCOUNTER — Telehealth: Payer: Self-pay | Admitting: Internal Medicine

## 2018-07-17 DIAGNOSIS — E785 Hyperlipidemia, unspecified: Secondary | ICD-10-CM | POA: Diagnosis not present

## 2018-07-17 DIAGNOSIS — K219 Gastro-esophageal reflux disease without esophagitis: Secondary | ICD-10-CM | POA: Diagnosis not present

## 2018-07-17 DIAGNOSIS — E441 Mild protein-calorie malnutrition: Secondary | ICD-10-CM | POA: Diagnosis not present

## 2018-07-17 DIAGNOSIS — L89322 Pressure ulcer of left buttock, stage 2: Secondary | ICD-10-CM | POA: Diagnosis not present

## 2018-07-17 DIAGNOSIS — N400 Enlarged prostate without lower urinary tract symptoms: Secondary | ICD-10-CM | POA: Diagnosis not present

## 2018-07-17 DIAGNOSIS — E1151 Type 2 diabetes mellitus with diabetic peripheral angiopathy without gangrene: Secondary | ICD-10-CM | POA: Diagnosis not present

## 2018-07-17 DIAGNOSIS — G20 Parkinson's disease: Secondary | ICD-10-CM | POA: Diagnosis not present

## 2018-07-17 DIAGNOSIS — Z9181 History of falling: Secondary | ICD-10-CM | POA: Diagnosis not present

## 2018-07-17 DIAGNOSIS — I10 Essential (primary) hypertension: Secondary | ICD-10-CM | POA: Diagnosis not present

## 2018-07-17 DIAGNOSIS — E1142 Type 2 diabetes mellitus with diabetic polyneuropathy: Secondary | ICD-10-CM | POA: Diagnosis not present

## 2018-07-17 DIAGNOSIS — I872 Venous insufficiency (chronic) (peripheral): Secondary | ICD-10-CM | POA: Diagnosis not present

## 2018-07-17 DIAGNOSIS — M40202 Unspecified kyphosis, cervical region: Secondary | ICD-10-CM | POA: Diagnosis not present

## 2018-07-17 NOTE — Telephone Encounter (Signed)
Heather from [facility] [phone]   He has been opened for home care services put him down for once a week for five weeks for mainly looking at wound on his bottom. It is a shallow phase 2. He wants to continue putting Silvadene on it and he didn't want her to do a foam dressing. She wants to see if his regular nurse can talk him into doing something different.

## 2018-07-18 DIAGNOSIS — L89322 Pressure ulcer of left buttock, stage 2: Secondary | ICD-10-CM | POA: Diagnosis not present

## 2018-07-18 DIAGNOSIS — I872 Venous insufficiency (chronic) (peripheral): Secondary | ICD-10-CM | POA: Diagnosis not present

## 2018-07-18 DIAGNOSIS — G20 Parkinson's disease: Secondary | ICD-10-CM | POA: Diagnosis not present

## 2018-07-18 DIAGNOSIS — E1142 Type 2 diabetes mellitus with diabetic polyneuropathy: Secondary | ICD-10-CM | POA: Diagnosis not present

## 2018-07-18 DIAGNOSIS — E1151 Type 2 diabetes mellitus with diabetic peripheral angiopathy without gangrene: Secondary | ICD-10-CM | POA: Diagnosis not present

## 2018-07-18 DIAGNOSIS — I10 Essential (primary) hypertension: Secondary | ICD-10-CM | POA: Diagnosis not present

## 2018-07-18 NOTE — Telephone Encounter (Signed)
Spoke to [employer]. Pt just wants the silvadene.

## 2018-07-18 NOTE — Telephone Encounter (Signed)
I had ordered the silvadene for him and I don't think the foam is necessarily any better than that. I wouldn't fight him about it

## 2018-07-20 DIAGNOSIS — I872 Venous insufficiency (chronic) (peripheral): Secondary | ICD-10-CM | POA: Diagnosis not present

## 2018-07-20 DIAGNOSIS — L89322 Pressure ulcer of left buttock, stage 2: Secondary | ICD-10-CM | POA: Diagnosis not present

## 2018-07-20 DIAGNOSIS — E1142 Type 2 diabetes mellitus with diabetic polyneuropathy: Secondary | ICD-10-CM | POA: Diagnosis not present

## 2018-07-20 DIAGNOSIS — G20 Parkinson's disease: Secondary | ICD-10-CM | POA: Diagnosis not present

## 2018-07-20 DIAGNOSIS — E1151 Type 2 diabetes mellitus with diabetic peripheral angiopathy without gangrene: Secondary | ICD-10-CM | POA: Diagnosis not present

## 2018-07-20 DIAGNOSIS — I10 Essential (primary) hypertension: Secondary | ICD-10-CM | POA: Diagnosis not present

## 2018-07-22 DIAGNOSIS — L89322 Pressure ulcer of left buttock, stage 2: Secondary | ICD-10-CM | POA: Diagnosis not present

## 2018-07-22 DIAGNOSIS — G20 Parkinson's disease: Secondary | ICD-10-CM | POA: Diagnosis not present

## 2018-07-22 DIAGNOSIS — I10 Essential (primary) hypertension: Secondary | ICD-10-CM | POA: Diagnosis not present

## 2018-07-22 DIAGNOSIS — I872 Venous insufficiency (chronic) (peripheral): Secondary | ICD-10-CM | POA: Diagnosis not present

## 2018-07-22 DIAGNOSIS — E1142 Type 2 diabetes mellitus with diabetic polyneuropathy: Secondary | ICD-10-CM | POA: Diagnosis not present

## 2018-07-22 DIAGNOSIS — E1151 Type 2 diabetes mellitus with diabetic peripheral angiopathy without gangrene: Secondary | ICD-10-CM | POA: Diagnosis not present

## 2018-07-25 DIAGNOSIS — E1151 Type 2 diabetes mellitus with diabetic peripheral angiopathy without gangrene: Secondary | ICD-10-CM | POA: Diagnosis not present

## 2018-07-25 DIAGNOSIS — E1142 Type 2 diabetes mellitus with diabetic polyneuropathy: Secondary | ICD-10-CM | POA: Diagnosis not present

## 2018-07-25 DIAGNOSIS — L89322 Pressure ulcer of left buttock, stage 2: Secondary | ICD-10-CM | POA: Diagnosis not present

## 2018-07-25 DIAGNOSIS — I10 Essential (primary) hypertension: Secondary | ICD-10-CM | POA: Diagnosis not present

## 2018-07-25 DIAGNOSIS — G20 Parkinson's disease: Secondary | ICD-10-CM | POA: Diagnosis not present

## 2018-07-25 DIAGNOSIS — I872 Venous insufficiency (chronic) (peripheral): Secondary | ICD-10-CM | POA: Diagnosis not present

## 2018-07-26 DIAGNOSIS — E1151 Type 2 diabetes mellitus with diabetic peripheral angiopathy without gangrene: Secondary | ICD-10-CM | POA: Diagnosis not present

## 2018-07-26 DIAGNOSIS — E441 Mild protein-calorie malnutrition: Secondary | ICD-10-CM | POA: Diagnosis not present

## 2018-07-26 DIAGNOSIS — E785 Hyperlipidemia, unspecified: Secondary | ICD-10-CM | POA: Diagnosis not present

## 2018-07-26 DIAGNOSIS — L89322 Pressure ulcer of left buttock, stage 2: Secondary | ICD-10-CM | POA: Diagnosis not present

## 2018-07-26 DIAGNOSIS — G20 Parkinson's disease: Secondary | ICD-10-CM | POA: Diagnosis not present

## 2018-07-26 DIAGNOSIS — I872 Venous insufficiency (chronic) (peripheral): Secondary | ICD-10-CM | POA: Diagnosis not present

## 2018-07-26 DIAGNOSIS — N400 Enlarged prostate without lower urinary tract symptoms: Secondary | ICD-10-CM | POA: Diagnosis not present

## 2018-07-26 DIAGNOSIS — E1142 Type 2 diabetes mellitus with diabetic polyneuropathy: Secondary | ICD-10-CM | POA: Diagnosis not present

## 2018-07-26 DIAGNOSIS — I10 Essential (primary) hypertension: Secondary | ICD-10-CM | POA: Diagnosis not present

## 2018-07-26 DIAGNOSIS — M40202 Unspecified kyphosis, cervical region: Secondary | ICD-10-CM | POA: Diagnosis not present

## 2018-07-26 DIAGNOSIS — K219 Gastro-esophageal reflux disease without esophagitis: Secondary | ICD-10-CM | POA: Diagnosis not present

## 2018-07-26 DIAGNOSIS — Z9181 History of falling: Secondary | ICD-10-CM | POA: Diagnosis not present

## 2018-07-29 DIAGNOSIS — E1151 Type 2 diabetes mellitus with diabetic peripheral angiopathy without gangrene: Secondary | ICD-10-CM | POA: Diagnosis not present

## 2018-07-29 DIAGNOSIS — I872 Venous insufficiency (chronic) (peripheral): Secondary | ICD-10-CM | POA: Diagnosis not present

## 2018-07-29 DIAGNOSIS — G20 Parkinson's disease: Secondary | ICD-10-CM | POA: Diagnosis not present

## 2018-07-29 DIAGNOSIS — L89322 Pressure ulcer of left buttock, stage 2: Secondary | ICD-10-CM | POA: Diagnosis not present

## 2018-07-29 DIAGNOSIS — I10 Essential (primary) hypertension: Secondary | ICD-10-CM | POA: Diagnosis not present

## 2018-07-29 DIAGNOSIS — E1142 Type 2 diabetes mellitus with diabetic polyneuropathy: Secondary | ICD-10-CM | POA: Diagnosis not present

## 2018-07-31 DIAGNOSIS — G20 Parkinson's disease: Secondary | ICD-10-CM | POA: Diagnosis not present

## 2018-07-31 DIAGNOSIS — I872 Venous insufficiency (chronic) (peripheral): Secondary | ICD-10-CM | POA: Diagnosis not present

## 2018-07-31 DIAGNOSIS — E1151 Type 2 diabetes mellitus with diabetic peripheral angiopathy without gangrene: Secondary | ICD-10-CM | POA: Diagnosis not present

## 2018-07-31 DIAGNOSIS — E1142 Type 2 diabetes mellitus with diabetic polyneuropathy: Secondary | ICD-10-CM | POA: Diagnosis not present

## 2018-07-31 DIAGNOSIS — I10 Essential (primary) hypertension: Secondary | ICD-10-CM | POA: Diagnosis not present

## 2018-07-31 DIAGNOSIS — L89322 Pressure ulcer of left buttock, stage 2: Secondary | ICD-10-CM | POA: Diagnosis not present

## 2018-08-05 DIAGNOSIS — E1151 Type 2 diabetes mellitus with diabetic peripheral angiopathy without gangrene: Secondary | ICD-10-CM | POA: Diagnosis not present

## 2018-08-05 DIAGNOSIS — I10 Essential (primary) hypertension: Secondary | ICD-10-CM | POA: Diagnosis not present

## 2018-08-05 DIAGNOSIS — G20 Parkinson's disease: Secondary | ICD-10-CM | POA: Diagnosis not present

## 2018-08-05 DIAGNOSIS — E1142 Type 2 diabetes mellitus with diabetic polyneuropathy: Secondary | ICD-10-CM | POA: Diagnosis not present

## 2018-08-05 DIAGNOSIS — L89322 Pressure ulcer of left buttock, stage 2: Secondary | ICD-10-CM | POA: Diagnosis not present

## 2018-08-05 DIAGNOSIS — I872 Venous insufficiency (chronic) (peripheral): Secondary | ICD-10-CM | POA: Diagnosis not present

## 2018-08-07 ENCOUNTER — Other Ambulatory Visit: Payer: Self-pay

## 2018-08-07 ENCOUNTER — Other Ambulatory Visit: Payer: Medicare Other | Admitting: Licensed Clinical Social Worker

## 2018-08-07 DIAGNOSIS — Z515 Encounter for palliative care: Secondary | ICD-10-CM

## 2018-08-07 NOTE — Progress Notes (Signed)
COMMUNITY PALLIATIVE CARE SW NOTE  PATIENT NAME: Alan Peterson DOB: January 18, 1941 MRN: [id number]  PRIMARY CARE PROVIDER: Karie Schwalbe, MD  RESPONSIBLE PARTY:  Acct ID - Guarantor Home Phone Work Phone Relationship Acct Type  [id number] Alan Peterson* [id number]  Self P/F     [address], [place], [place] [address]   Due to the COVID-19 crisis, this virtual check-in visit was done via telephone from my office and it was initiated and consent given by thispatientand or family.  PLAN OF CARE and INTERVENTIONS:             1. GOALS OF CARE/ ADVANCE CARE PLANNING:  Goal is for patient to receive CNA care.  Code status was not discussed. 2. SOCIAL/EMOTIONAL/SPIRITUAL ASSESSMENT/ INTERVENTIONS:  SW conducted a Biomedical engineer visit with patient's son, Alan Peterson.  Provided education regarding the Palliative Care program.  Alan Peterson was focused on securing aide services for assistance with patient's bathing.  SW emailed [name] for in-home aide services and a program brochure.  [name] would like the Palliative Care RN to contact him regarding Palliative Care. 3. PATIENT/CAREGIVER EDUCATION/ COPING:  Son expresses his concerns openly. 4. PERSONAL EMERGENCY PLAN:  Son will contact his father's MD. 5. COMMUNITY RESOURCES COORDINATION/ HEALTH CARE NAVIGATION:  Son did not identify. 6. FINANCIAL/LEGAL CONCERNS/INTERVENTIONS:  Son stated that patient did not qualify for Medicaid.     SOCIAL HX:  Social History   Tobacco Use  . Smoking status: Never Smoker  . Smokeless tobacco: Never Used  Substance Use Topics  . Alcohol use: Yes    Comment: beer    CODE STATUS:  No prior code status.  ADVANCED DIRECTIVES: N MOST FORM COMPLETE: N HOSPICE EDUCATION PROVIDED: N Duration of visit and documentation:  30 minutes.      Alan Kohler Husein Guedes, LCSW

## 2018-08-10 DIAGNOSIS — E1151 Type 2 diabetes mellitus with diabetic peripheral angiopathy without gangrene: Secondary | ICD-10-CM | POA: Diagnosis not present

## 2018-08-10 DIAGNOSIS — E1142 Type 2 diabetes mellitus with diabetic polyneuropathy: Secondary | ICD-10-CM | POA: Diagnosis not present

## 2018-08-10 DIAGNOSIS — G20 Parkinson's disease: Secondary | ICD-10-CM | POA: Diagnosis not present

## 2018-08-10 DIAGNOSIS — L89322 Pressure ulcer of left buttock, stage 2: Secondary | ICD-10-CM | POA: Diagnosis not present

## 2018-08-10 DIAGNOSIS — I872 Venous insufficiency (chronic) (peripheral): Secondary | ICD-10-CM | POA: Diagnosis not present

## 2018-08-10 DIAGNOSIS — I10 Essential (primary) hypertension: Secondary | ICD-10-CM | POA: Diagnosis not present

## 2018-08-12 DIAGNOSIS — E1151 Type 2 diabetes mellitus with diabetic peripheral angiopathy without gangrene: Secondary | ICD-10-CM | POA: Diagnosis not present

## 2018-08-12 DIAGNOSIS — I872 Venous insufficiency (chronic) (peripheral): Secondary | ICD-10-CM | POA: Diagnosis not present

## 2018-08-12 DIAGNOSIS — I10 Essential (primary) hypertension: Secondary | ICD-10-CM | POA: Diagnosis not present

## 2018-08-12 DIAGNOSIS — E1142 Type 2 diabetes mellitus with diabetic polyneuropathy: Secondary | ICD-10-CM | POA: Diagnosis not present

## 2018-08-12 DIAGNOSIS — L89322 Pressure ulcer of left buttock, stage 2: Secondary | ICD-10-CM | POA: Diagnosis not present

## 2018-08-12 DIAGNOSIS — G20 Parkinson's disease: Secondary | ICD-10-CM | POA: Diagnosis not present

## 2018-08-15 DIAGNOSIS — E1151 Type 2 diabetes mellitus with diabetic peripheral angiopathy without gangrene: Secondary | ICD-10-CM | POA: Diagnosis not present

## 2018-08-15 DIAGNOSIS — I872 Venous insufficiency (chronic) (peripheral): Secondary | ICD-10-CM | POA: Diagnosis not present

## 2018-08-15 DIAGNOSIS — E1142 Type 2 diabetes mellitus with diabetic polyneuropathy: Secondary | ICD-10-CM | POA: Diagnosis not present

## 2018-08-15 DIAGNOSIS — L89322 Pressure ulcer of left buttock, stage 2: Secondary | ICD-10-CM | POA: Diagnosis not present

## 2018-08-15 DIAGNOSIS — G20 Parkinson's disease: Secondary | ICD-10-CM | POA: Diagnosis not present

## 2018-08-15 DIAGNOSIS — I10 Essential (primary) hypertension: Secondary | ICD-10-CM | POA: Diagnosis not present

## 2018-08-16 DIAGNOSIS — M40202 Unspecified kyphosis, cervical region: Secondary | ICD-10-CM | POA: Diagnosis not present

## 2018-08-16 DIAGNOSIS — G20 Parkinson's disease: Secondary | ICD-10-CM | POA: Diagnosis not present

## 2018-08-16 DIAGNOSIS — E441 Mild protein-calorie malnutrition: Secondary | ICD-10-CM | POA: Diagnosis not present

## 2018-08-16 DIAGNOSIS — E1151 Type 2 diabetes mellitus with diabetic peripheral angiopathy without gangrene: Secondary | ICD-10-CM | POA: Diagnosis not present

## 2018-08-16 DIAGNOSIS — E1142 Type 2 diabetes mellitus with diabetic polyneuropathy: Secondary | ICD-10-CM | POA: Diagnosis not present

## 2018-08-16 DIAGNOSIS — N400 Enlarged prostate without lower urinary tract symptoms: Secondary | ICD-10-CM | POA: Diagnosis not present

## 2018-08-16 DIAGNOSIS — E785 Hyperlipidemia, unspecified: Secondary | ICD-10-CM | POA: Diagnosis not present

## 2018-08-16 DIAGNOSIS — I872 Venous insufficiency (chronic) (peripheral): Secondary | ICD-10-CM | POA: Diagnosis not present

## 2018-08-16 DIAGNOSIS — Z9181 History of falling: Secondary | ICD-10-CM | POA: Diagnosis not present

## 2018-08-16 DIAGNOSIS — L89322 Pressure ulcer of left buttock, stage 2: Secondary | ICD-10-CM | POA: Diagnosis not present

## 2018-08-16 DIAGNOSIS — K219 Gastro-esophageal reflux disease without esophagitis: Secondary | ICD-10-CM | POA: Diagnosis not present

## 2018-08-16 DIAGNOSIS — I10 Essential (primary) hypertension: Secondary | ICD-10-CM | POA: Diagnosis not present

## 2018-08-19 ENCOUNTER — Telehealth: Payer: Self-pay | Admitting: Internal Medicine

## 2018-08-19 DIAGNOSIS — I10 Essential (primary) hypertension: Secondary | ICD-10-CM | POA: Diagnosis not present

## 2018-08-19 DIAGNOSIS — I872 Venous insufficiency (chronic) (peripheral): Secondary | ICD-10-CM | POA: Diagnosis not present

## 2018-08-19 DIAGNOSIS — E1151 Type 2 diabetes mellitus with diabetic peripheral angiopathy without gangrene: Secondary | ICD-10-CM | POA: Diagnosis not present

## 2018-08-19 DIAGNOSIS — L89322 Pressure ulcer of left buttock, stage 2: Secondary | ICD-10-CM | POA: Diagnosis not present

## 2018-08-19 DIAGNOSIS — G20 Parkinson's disease: Secondary | ICD-10-CM | POA: Diagnosis not present

## 2018-08-19 DIAGNOSIS — E1142 Type 2 diabetes mellitus with diabetic polyneuropathy: Secondary | ICD-10-CM | POA: Diagnosis not present

## 2018-08-19 NOTE — Telephone Encounter (Signed)
okay

## 2018-08-19 NOTE — Telephone Encounter (Signed)
Lori, with [facility], called to request verbal orders for pt. Continue would care, no changes, until wound heals. [phone]

## 2018-08-19 NOTE — Telephone Encounter (Signed)
Left verbal orders on VM for Lori.

## 2018-08-21 ENCOUNTER — Other Ambulatory Visit: Payer: Self-pay | Admitting: Internal Medicine

## 2018-08-27 ENCOUNTER — Other Ambulatory Visit: Payer: Self-pay | Admitting: Internal Medicine

## 2018-08-28 DIAGNOSIS — E1151 Type 2 diabetes mellitus with diabetic peripheral angiopathy without gangrene: Secondary | ICD-10-CM | POA: Diagnosis not present

## 2018-08-28 DIAGNOSIS — L89322 Pressure ulcer of left buttock, stage 2: Secondary | ICD-10-CM | POA: Diagnosis not present

## 2018-08-28 DIAGNOSIS — I10 Essential (primary) hypertension: Secondary | ICD-10-CM | POA: Diagnosis not present

## 2018-08-28 DIAGNOSIS — E1142 Type 2 diabetes mellitus with diabetic polyneuropathy: Secondary | ICD-10-CM | POA: Diagnosis not present

## 2018-08-28 DIAGNOSIS — G20 Parkinson's disease: Secondary | ICD-10-CM | POA: Diagnosis not present

## 2018-08-28 DIAGNOSIS — I872 Venous insufficiency (chronic) (peripheral): Secondary | ICD-10-CM | POA: Diagnosis not present

## 2018-08-31 DIAGNOSIS — I872 Venous insufficiency (chronic) (peripheral): Secondary | ICD-10-CM | POA: Diagnosis not present

## 2018-08-31 DIAGNOSIS — I10 Essential (primary) hypertension: Secondary | ICD-10-CM | POA: Diagnosis not present

## 2018-08-31 DIAGNOSIS — L89322 Pressure ulcer of left buttock, stage 2: Secondary | ICD-10-CM | POA: Diagnosis not present

## 2018-08-31 DIAGNOSIS — E1151 Type 2 diabetes mellitus with diabetic peripheral angiopathy without gangrene: Secondary | ICD-10-CM | POA: Diagnosis not present

## 2018-08-31 DIAGNOSIS — E1142 Type 2 diabetes mellitus with diabetic polyneuropathy: Secondary | ICD-10-CM | POA: Diagnosis not present

## 2018-08-31 DIAGNOSIS — G20 Parkinson's disease: Secondary | ICD-10-CM | POA: Diagnosis not present

## 2018-09-03 ENCOUNTER — Telehealth: Payer: Self-pay | Admitting: Licensed Clinical Social Worker

## 2018-09-03 NOTE — Telephone Encounter (Signed)
Palliative Care SW left a vm for son to schedule a visit.

## 2018-09-04 DIAGNOSIS — G20 Parkinson's disease: Secondary | ICD-10-CM | POA: Diagnosis not present

## 2018-09-04 DIAGNOSIS — I872 Venous insufficiency (chronic) (peripheral): Secondary | ICD-10-CM | POA: Diagnosis not present

## 2018-09-04 DIAGNOSIS — I10 Essential (primary) hypertension: Secondary | ICD-10-CM | POA: Diagnosis not present

## 2018-09-04 DIAGNOSIS — E1151 Type 2 diabetes mellitus with diabetic peripheral angiopathy without gangrene: Secondary | ICD-10-CM | POA: Diagnosis not present

## 2018-09-04 DIAGNOSIS — E1142 Type 2 diabetes mellitus with diabetic polyneuropathy: Secondary | ICD-10-CM | POA: Diagnosis not present

## 2018-09-04 DIAGNOSIS — L89322 Pressure ulcer of left buttock, stage 2: Secondary | ICD-10-CM | POA: Diagnosis not present

## 2018-09-05 DIAGNOSIS — I10 Essential (primary) hypertension: Secondary | ICD-10-CM | POA: Diagnosis not present

## 2018-09-05 DIAGNOSIS — G20 Parkinson's disease: Secondary | ICD-10-CM | POA: Diagnosis not present

## 2018-09-05 DIAGNOSIS — E1151 Type 2 diabetes mellitus with diabetic peripheral angiopathy without gangrene: Secondary | ICD-10-CM | POA: Diagnosis not present

## 2018-09-05 DIAGNOSIS — I872 Venous insufficiency (chronic) (peripheral): Secondary | ICD-10-CM | POA: Diagnosis not present

## 2018-09-05 DIAGNOSIS — L89322 Pressure ulcer of left buttock, stage 2: Secondary | ICD-10-CM | POA: Diagnosis not present

## 2018-09-05 DIAGNOSIS — E1142 Type 2 diabetes mellitus with diabetic polyneuropathy: Secondary | ICD-10-CM | POA: Diagnosis not present

## 2018-09-11 DIAGNOSIS — I10 Essential (primary) hypertension: Secondary | ICD-10-CM | POA: Diagnosis not present

## 2018-09-11 DIAGNOSIS — G20 Parkinson's disease: Secondary | ICD-10-CM | POA: Diagnosis not present

## 2018-09-11 DIAGNOSIS — L89322 Pressure ulcer of left buttock, stage 2: Secondary | ICD-10-CM | POA: Diagnosis not present

## 2018-09-11 DIAGNOSIS — I872 Venous insufficiency (chronic) (peripheral): Secondary | ICD-10-CM | POA: Diagnosis not present

## 2018-09-11 DIAGNOSIS — E1142 Type 2 diabetes mellitus with diabetic polyneuropathy: Secondary | ICD-10-CM | POA: Diagnosis not present

## 2018-09-11 DIAGNOSIS — E1151 Type 2 diabetes mellitus with diabetic peripheral angiopathy without gangrene: Secondary | ICD-10-CM | POA: Diagnosis not present

## 2018-09-12 DIAGNOSIS — I872 Venous insufficiency (chronic) (peripheral): Secondary | ICD-10-CM | POA: Diagnosis not present

## 2018-09-12 DIAGNOSIS — L89322 Pressure ulcer of left buttock, stage 2: Secondary | ICD-10-CM | POA: Diagnosis not present

## 2018-09-12 DIAGNOSIS — G20 Parkinson's disease: Secondary | ICD-10-CM | POA: Diagnosis not present

## 2018-09-12 DIAGNOSIS — E1142 Type 2 diabetes mellitus with diabetic polyneuropathy: Secondary | ICD-10-CM | POA: Diagnosis not present

## 2018-09-12 DIAGNOSIS — E1151 Type 2 diabetes mellitus with diabetic peripheral angiopathy without gangrene: Secondary | ICD-10-CM | POA: Diagnosis not present

## 2018-09-12 DIAGNOSIS — I10 Essential (primary) hypertension: Secondary | ICD-10-CM | POA: Diagnosis not present

## 2018-09-15 DIAGNOSIS — E441 Mild protein-calorie malnutrition: Secondary | ICD-10-CM | POA: Diagnosis not present

## 2018-09-15 DIAGNOSIS — E1142 Type 2 diabetes mellitus with diabetic polyneuropathy: Secondary | ICD-10-CM | POA: Diagnosis not present

## 2018-09-15 DIAGNOSIS — N400 Enlarged prostate without lower urinary tract symptoms: Secondary | ICD-10-CM | POA: Diagnosis not present

## 2018-09-15 DIAGNOSIS — E785 Hyperlipidemia, unspecified: Secondary | ICD-10-CM | POA: Diagnosis not present

## 2018-09-15 DIAGNOSIS — M40202 Unspecified kyphosis, cervical region: Secondary | ICD-10-CM | POA: Diagnosis not present

## 2018-09-15 DIAGNOSIS — E1151 Type 2 diabetes mellitus with diabetic peripheral angiopathy without gangrene: Secondary | ICD-10-CM | POA: Diagnosis not present

## 2018-09-15 DIAGNOSIS — Z9181 History of falling: Secondary | ICD-10-CM | POA: Diagnosis not present

## 2018-09-15 DIAGNOSIS — I10 Essential (primary) hypertension: Secondary | ICD-10-CM | POA: Diagnosis not present

## 2018-09-15 DIAGNOSIS — K219 Gastro-esophageal reflux disease without esophagitis: Secondary | ICD-10-CM | POA: Diagnosis not present

## 2018-09-15 DIAGNOSIS — I872 Venous insufficiency (chronic) (peripheral): Secondary | ICD-10-CM | POA: Diagnosis not present

## 2018-09-15 DIAGNOSIS — G20 Parkinson's disease: Secondary | ICD-10-CM | POA: Diagnosis not present

## 2018-09-16 DIAGNOSIS — I872 Venous insufficiency (chronic) (peripheral): Secondary | ICD-10-CM | POA: Diagnosis not present

## 2018-09-16 DIAGNOSIS — E441 Mild protein-calorie malnutrition: Secondary | ICD-10-CM | POA: Diagnosis not present

## 2018-09-16 DIAGNOSIS — E1151 Type 2 diabetes mellitus with diabetic peripheral angiopathy without gangrene: Secondary | ICD-10-CM | POA: Diagnosis not present

## 2018-09-16 DIAGNOSIS — G20 Parkinson's disease: Secondary | ICD-10-CM | POA: Diagnosis not present

## 2018-09-16 DIAGNOSIS — I10 Essential (primary) hypertension: Secondary | ICD-10-CM | POA: Diagnosis not present

## 2018-09-16 DIAGNOSIS — E1142 Type 2 diabetes mellitus with diabetic polyneuropathy: Secondary | ICD-10-CM | POA: Diagnosis not present

## 2018-09-23 DIAGNOSIS — I872 Venous insufficiency (chronic) (peripheral): Secondary | ICD-10-CM | POA: Diagnosis not present

## 2018-09-23 DIAGNOSIS — G20 Parkinson's disease: Secondary | ICD-10-CM | POA: Diagnosis not present

## 2018-09-23 DIAGNOSIS — E1151 Type 2 diabetes mellitus with diabetic peripheral angiopathy without gangrene: Secondary | ICD-10-CM | POA: Diagnosis not present

## 2018-09-23 DIAGNOSIS — I10 Essential (primary) hypertension: Secondary | ICD-10-CM | POA: Diagnosis not present

## 2018-09-23 DIAGNOSIS — E1142 Type 2 diabetes mellitus with diabetic polyneuropathy: Secondary | ICD-10-CM | POA: Diagnosis not present

## 2018-09-23 DIAGNOSIS — E441 Mild protein-calorie malnutrition: Secondary | ICD-10-CM | POA: Diagnosis not present

## 2018-09-30 DIAGNOSIS — E1142 Type 2 diabetes mellitus with diabetic polyneuropathy: Secondary | ICD-10-CM | POA: Diagnosis not present

## 2018-09-30 DIAGNOSIS — I872 Venous insufficiency (chronic) (peripheral): Secondary | ICD-10-CM | POA: Diagnosis not present

## 2018-09-30 DIAGNOSIS — E441 Mild protein-calorie malnutrition: Secondary | ICD-10-CM | POA: Diagnosis not present

## 2018-09-30 DIAGNOSIS — I10 Essential (primary) hypertension: Secondary | ICD-10-CM | POA: Diagnosis not present

## 2018-09-30 DIAGNOSIS — G20 Parkinson's disease: Secondary | ICD-10-CM | POA: Diagnosis not present

## 2018-09-30 DIAGNOSIS — E1151 Type 2 diabetes mellitus with diabetic peripheral angiopathy without gangrene: Secondary | ICD-10-CM | POA: Diagnosis not present

## 2018-10-01 DIAGNOSIS — I872 Venous insufficiency (chronic) (peripheral): Secondary | ICD-10-CM | POA: Diagnosis not present

## 2018-10-01 DIAGNOSIS — Z9181 History of falling: Secondary | ICD-10-CM | POA: Diagnosis not present

## 2018-10-01 DIAGNOSIS — N400 Enlarged prostate without lower urinary tract symptoms: Secondary | ICD-10-CM

## 2018-10-01 DIAGNOSIS — E785 Hyperlipidemia, unspecified: Secondary | ICD-10-CM | POA: Diagnosis not present

## 2018-10-01 DIAGNOSIS — E441 Mild protein-calorie malnutrition: Secondary | ICD-10-CM | POA: Diagnosis not present

## 2018-10-01 DIAGNOSIS — E1142 Type 2 diabetes mellitus with diabetic polyneuropathy: Secondary | ICD-10-CM | POA: Diagnosis not present

## 2018-10-01 DIAGNOSIS — K219 Gastro-esophageal reflux disease without esophagitis: Secondary | ICD-10-CM | POA: Diagnosis not present

## 2018-10-01 DIAGNOSIS — I10 Essential (primary) hypertension: Secondary | ICD-10-CM | POA: Diagnosis not present

## 2018-10-01 DIAGNOSIS — M40202 Unspecified kyphosis, cervical region: Secondary | ICD-10-CM | POA: Diagnosis not present

## 2018-10-01 DIAGNOSIS — E1151 Type 2 diabetes mellitus with diabetic peripheral angiopathy without gangrene: Secondary | ICD-10-CM | POA: Diagnosis not present

## 2018-10-01 DIAGNOSIS — G20 Parkinson's disease: Secondary | ICD-10-CM | POA: Diagnosis not present

## 2018-10-07 ENCOUNTER — Other Ambulatory Visit: Payer: Medicare Other | Admitting: Licensed Clinical Social Worker

## 2018-10-07 ENCOUNTER — Other Ambulatory Visit: Payer: Self-pay

## 2018-10-07 DIAGNOSIS — E1151 Type 2 diabetes mellitus with diabetic peripheral angiopathy without gangrene: Secondary | ICD-10-CM | POA: Diagnosis not present

## 2018-10-07 DIAGNOSIS — Z515 Encounter for palliative care: Secondary | ICD-10-CM | POA: Diagnosis not present

## 2018-10-07 DIAGNOSIS — G20 Parkinson's disease: Secondary | ICD-10-CM | POA: Diagnosis not present

## 2018-10-07 DIAGNOSIS — I872 Venous insufficiency (chronic) (peripheral): Secondary | ICD-10-CM | POA: Diagnosis not present

## 2018-10-07 DIAGNOSIS — E1142 Type 2 diabetes mellitus with diabetic polyneuropathy: Secondary | ICD-10-CM | POA: Diagnosis not present

## 2018-10-07 DIAGNOSIS — E441 Mild protein-calorie malnutrition: Secondary | ICD-10-CM | POA: Diagnosis not present

## 2018-10-07 DIAGNOSIS — I10 Essential (primary) hypertension: Secondary | ICD-10-CM | POA: Diagnosis not present

## 2018-10-08 NOTE — Progress Notes (Signed)
COMMUNITY PALLIATIVE CARE SW NOTE  PATIENT NAME: Alan Peterson DOB: 08/22/1940 MRN: [id number]  PRIMARY CARE PROVIDER: [name], MD  RESPONSIBLE PARTY:  Acct ID - Guarantor Home Phone Work Phone Relationship Acct Type  [id number] [name]  Self P/F     [address], [place], [place] [address]   Due to the COVID-19 crisis, this virtual check-in visit was done via telephone from my office and it was initiated and consent given by thispatientand or family.  PLAN OF CARE and INTERVENTIONS:             1. GOALS OF CARE/ ADVANCE CARE PLANNING:  Goal is for patient to remain in his home.  Full Code. 2. SOCIAL/EMOTIONAL/SPIRITUAL ASSESSMENT/ INTERVENTIONS:  SW conducted a Sales executive visit with patient's son, [name].  He reports patient's Parkinson's symptoms have increased.  Patient can stand and use a walker for about 20 feet.  He can no longer make simple meals.  [name] is now responsible for fixing all of his meals. Patient also requires [name] to change his brief before bed, which he used to be able to do himself.  SW provided active listening and supportive counseling. 3. PATIENT/CAREGIVER EDUCATION/ COPING:  Son copes by expressing his feelings openly. 4. PERSONAL EMERGENCY PLAN:  Patient's MD will be contacted. 5. COMMUNITY RESOURCES COORDINATION/ HEALTH CARE NAVIGATION:  None. 6. FINANCIAL/LEGAL CONCERNS/INTERVENTIONS:  Patient is on a fixed income.     SOCIAL HX:  Social History   Tobacco Use  . Smoking status: Never Smoker  . Smokeless tobacco: Never Used  Substance Use Topics  . Alcohol use: Yes    Comment: beer    CODE STATUS:  Full Code  ADVANCED DIRECTIVES: N MOST FORM COMPLETE:  N HOSPICE EDUCATION PROVIDED:   N PPS:  Patient's appetite is normal.  He uses a walker to ambulate. Duration of visit and documentation:  45 minutes.      Creola Corn Shakiera Edelson, LCSW

## 2018-10-14 ENCOUNTER — Other Ambulatory Visit: Payer: Self-pay

## 2018-10-14 DIAGNOSIS — E1142 Type 2 diabetes mellitus with diabetic polyneuropathy: Secondary | ICD-10-CM | POA: Diagnosis not present

## 2018-10-14 DIAGNOSIS — E1151 Type 2 diabetes mellitus with diabetic peripheral angiopathy without gangrene: Secondary | ICD-10-CM | POA: Diagnosis not present

## 2018-10-14 DIAGNOSIS — G20 Parkinson's disease: Secondary | ICD-10-CM | POA: Diagnosis not present

## 2018-10-14 DIAGNOSIS — I872 Venous insufficiency (chronic) (peripheral): Secondary | ICD-10-CM | POA: Diagnosis not present

## 2018-10-14 DIAGNOSIS — I10 Essential (primary) hypertension: Secondary | ICD-10-CM | POA: Diagnosis not present

## 2018-10-14 DIAGNOSIS — E441 Mild protein-calorie malnutrition: Secondary | ICD-10-CM | POA: Diagnosis not present

## 2018-10-15 DIAGNOSIS — E1142 Type 2 diabetes mellitus with diabetic polyneuropathy: Secondary | ICD-10-CM | POA: Diagnosis not present

## 2018-10-15 DIAGNOSIS — I10 Essential (primary) hypertension: Secondary | ICD-10-CM | POA: Diagnosis not present

## 2018-10-15 DIAGNOSIS — E441 Mild protein-calorie malnutrition: Secondary | ICD-10-CM | POA: Diagnosis not present

## 2018-10-15 DIAGNOSIS — I872 Venous insufficiency (chronic) (peripheral): Secondary | ICD-10-CM | POA: Diagnosis not present

## 2018-10-15 DIAGNOSIS — M40202 Unspecified kyphosis, cervical region: Secondary | ICD-10-CM | POA: Diagnosis not present

## 2018-10-15 DIAGNOSIS — G20 Parkinson's disease: Secondary | ICD-10-CM | POA: Diagnosis not present

## 2018-10-15 DIAGNOSIS — E1151 Type 2 diabetes mellitus with diabetic peripheral angiopathy without gangrene: Secondary | ICD-10-CM | POA: Diagnosis not present

## 2018-10-15 DIAGNOSIS — E785 Hyperlipidemia, unspecified: Secondary | ICD-10-CM | POA: Diagnosis not present

## 2018-10-15 DIAGNOSIS — N400 Enlarged prostate without lower urinary tract symptoms: Secondary | ICD-10-CM | POA: Diagnosis not present

## 2018-10-15 DIAGNOSIS — Z9181 History of falling: Secondary | ICD-10-CM | POA: Diagnosis not present

## 2018-10-15 DIAGNOSIS — K219 Gastro-esophageal reflux disease without esophagitis: Secondary | ICD-10-CM | POA: Diagnosis not present

## 2018-10-22 ENCOUNTER — Other Ambulatory Visit: Payer: Self-pay | Admitting: Internal Medicine

## 2018-10-22 DIAGNOSIS — G20 Parkinson's disease: Secondary | ICD-10-CM | POA: Diagnosis not present

## 2018-10-22 DIAGNOSIS — E441 Mild protein-calorie malnutrition: Secondary | ICD-10-CM | POA: Diagnosis not present

## 2018-10-22 DIAGNOSIS — I10 Essential (primary) hypertension: Secondary | ICD-10-CM | POA: Diagnosis not present

## 2018-10-22 DIAGNOSIS — I872 Venous insufficiency (chronic) (peripheral): Secondary | ICD-10-CM | POA: Diagnosis not present

## 2018-10-22 DIAGNOSIS — E1142 Type 2 diabetes mellitus with diabetic polyneuropathy: Secondary | ICD-10-CM | POA: Diagnosis not present

## 2018-10-22 DIAGNOSIS — E1151 Type 2 diabetes mellitus with diabetic peripheral angiopathy without gangrene: Secondary | ICD-10-CM | POA: Diagnosis not present

## 2018-10-26 ENCOUNTER — Other Ambulatory Visit: Payer: Self-pay | Admitting: Internal Medicine

## 2018-10-29 ENCOUNTER — Other Ambulatory Visit: Payer: Medicare Other | Admitting: Licensed Clinical Social Worker

## 2018-10-29 ENCOUNTER — Telehealth: Payer: Self-pay | Admitting: *Deleted

## 2018-10-29 ENCOUNTER — Other Ambulatory Visit: Payer: Self-pay

## 2018-10-29 DIAGNOSIS — G20 Parkinson's disease: Secondary | ICD-10-CM | POA: Diagnosis not present

## 2018-10-29 DIAGNOSIS — E1151 Type 2 diabetes mellitus with diabetic peripheral angiopathy without gangrene: Secondary | ICD-10-CM | POA: Diagnosis not present

## 2018-10-29 DIAGNOSIS — Z515 Encounter for palliative care: Secondary | ICD-10-CM

## 2018-10-29 DIAGNOSIS — I872 Venous insufficiency (chronic) (peripheral): Secondary | ICD-10-CM | POA: Diagnosis not present

## 2018-10-29 DIAGNOSIS — I10 Essential (primary) hypertension: Secondary | ICD-10-CM | POA: Diagnosis not present

## 2018-10-29 DIAGNOSIS — E441 Mild protein-calorie malnutrition: Secondary | ICD-10-CM | POA: Diagnosis not present

## 2018-10-29 DIAGNOSIS — E1142 Type 2 diabetes mellitus with diabetic polyneuropathy: Secondary | ICD-10-CM | POA: Diagnosis not present

## 2018-10-29 NOTE — Telephone Encounter (Signed)
After speaking to Webb Silversmith NP verbal order was given to Mel to have a nurse check urine and culture on patient.

## 2018-10-29 NOTE — Telephone Encounter (Signed)
Mel PT with [facility] is there with patient now. Mel stated that patient is complaining of pain with urination and is just getting over a UTI a few weeks ago. Mel wants to know if he can have an order to get a nurse to come out and do a urine and culture on the patient?

## 2018-10-29 NOTE — Progress Notes (Signed)
COMMUNITY PALLIATIVE CARE SW NOTE  PATIENT NAME: ANTRON SETH DOB: 1940/12/02 MRN: [id number]  PRIMARY CARE PROVIDER: [name], MD  RESPONSIBLE PARTY:  Acct ID - Guarantor Home Phone Work Phone Relationship Acct Type  [id number] [name]  Self P/F     [address], [place], [place] [address]   Due to the COVID-19 crisis, this virtual check-in visit was done via telephone from my office and it was initiated and consent given by thispatientand or family.   PLAN OF CARE and INTERVENTIONS:             1. GOALS OF CARE/ ADVANCE CARE PLANNING:  Son's goal is for patient to remain at home.  Patient is a full code. 2. SOCIAL/EMOTIONAL/SPIRITUAL ASSESSMENT/ INTERVENTIONS:  SW conducted a Sales executive visit with patient's son, [name].  He stated his father appears to have more energy since he has been receiving physical therapy.  [name] continues to assist with patient's meals. 3. PATIENT/CAREGIVER EDUCATION/ COPING:  Son expresses his feelings openly. 4. PERSONAL EMERGENCY PLAN:  Son will call patient's MD. 5. COMMUNITY RESOURCES COORDINATION/ HEALTH CARE NAVIGATION:  None. 6. FINANCIAL/LEGAL CONCERNS/INTERVENTIONS:  Patient is on a fixed income.     SOCIAL HX:  Social History   Tobacco Use  . Smoking status: Never Smoker  . Smokeless tobacco: Never Used  Substance Use Topics  . Alcohol use: Yes    Comment: beer    CODE STATUS:  Full Code  ADVANCED DIRECTIVES: N MOST FORM COMPLETE:  N HOSPICE EDUCATION PROVIDED: N PPS:  Patient's appetite is normal.  He ambulates with a walker. Duration of visit and documentation:  30 minutes.      Creola Corn Finnegan Gatta, LCSW

## 2018-10-30 NOTE — Telephone Encounter (Signed)
Will follow-up with results

## 2018-10-31 ENCOUNTER — Telehealth: Payer: Self-pay | Admitting: Internal Medicine

## 2018-10-31 DIAGNOSIS — E441 Mild protein-calorie malnutrition: Secondary | ICD-10-CM | POA: Diagnosis not present

## 2018-10-31 DIAGNOSIS — G20 Parkinson's disease: Secondary | ICD-10-CM | POA: Diagnosis not present

## 2018-10-31 DIAGNOSIS — E1151 Type 2 diabetes mellitus with diabetic peripheral angiopathy without gangrene: Secondary | ICD-10-CM | POA: Diagnosis not present

## 2018-10-31 DIAGNOSIS — I872 Venous insufficiency (chronic) (peripheral): Secondary | ICD-10-CM | POA: Diagnosis not present

## 2018-10-31 DIAGNOSIS — E1142 Type 2 diabetes mellitus with diabetic polyneuropathy: Secondary | ICD-10-CM | POA: Diagnosis not present

## 2018-10-31 DIAGNOSIS — R399 Unspecified symptoms and signs involving the genitourinary system: Secondary | ICD-10-CM | POA: Diagnosis not present

## 2018-10-31 DIAGNOSIS — I10 Essential (primary) hypertension: Secondary | ICD-10-CM | POA: Diagnosis not present

## 2018-10-31 NOTE — Telephone Encounter (Addendum)
Spoke to [place]. We talked about getting a Education officer, museum in to try and get him some help. I told her that would be fine.

## 2018-10-31 NOTE — Telephone Encounter (Signed)
okay

## 2018-10-31 NOTE — Telephone Encounter (Signed)
Dianne @ [facility] called She just picked up urine sample to take to [facility] She would like verbal order To go back out to follow with patient 1 week 2  Best number [phone]

## 2018-11-04 DIAGNOSIS — E1142 Type 2 diabetes mellitus with diabetic polyneuropathy: Secondary | ICD-10-CM | POA: Diagnosis not present

## 2018-11-04 DIAGNOSIS — G20 Parkinson's disease: Secondary | ICD-10-CM | POA: Diagnosis not present

## 2018-11-04 DIAGNOSIS — I872 Venous insufficiency (chronic) (peripheral): Secondary | ICD-10-CM | POA: Diagnosis not present

## 2018-11-04 DIAGNOSIS — I10 Essential (primary) hypertension: Secondary | ICD-10-CM | POA: Diagnosis not present

## 2018-11-04 DIAGNOSIS — E441 Mild protein-calorie malnutrition: Secondary | ICD-10-CM | POA: Diagnosis not present

## 2018-11-04 DIAGNOSIS — E1151 Type 2 diabetes mellitus with diabetic peripheral angiopathy without gangrene: Secondary | ICD-10-CM | POA: Diagnosis not present

## 2018-11-05 NOTE — Telephone Encounter (Signed)
Spoke to Dale. Advised her the urine culture was negative. She said the big issue is that he only drinks 2 bottles of water a day. Says house is a mess. He cannot get up on his own to go to the restroom so he uses an adult diaper and has to wait for someone to change him and clean him up. She is really worried about him and his care.

## 2018-11-06 DIAGNOSIS — G20 Parkinson's disease: Secondary | ICD-10-CM | POA: Diagnosis not present

## 2018-11-06 DIAGNOSIS — E1151 Type 2 diabetes mellitus with diabetic peripheral angiopathy without gangrene: Secondary | ICD-10-CM | POA: Diagnosis not present

## 2018-11-06 DIAGNOSIS — E1142 Type 2 diabetes mellitus with diabetic polyneuropathy: Secondary | ICD-10-CM | POA: Diagnosis not present

## 2018-11-06 DIAGNOSIS — I872 Venous insufficiency (chronic) (peripheral): Secondary | ICD-10-CM | POA: Diagnosis not present

## 2018-11-06 DIAGNOSIS — E441 Mild protein-calorie malnutrition: Secondary | ICD-10-CM | POA: Diagnosis not present

## 2018-11-06 DIAGNOSIS — I10 Essential (primary) hypertension: Secondary | ICD-10-CM | POA: Diagnosis not present

## 2018-11-06 NOTE — Telephone Encounter (Signed)
Spoke to [place]. She said Education officer, museum went out yesterday but she has not spoken to them yet.

## 2018-11-06 NOTE — Telephone Encounter (Signed)
Yes--have the social worker from [facility] look into the situation I think he has been living like that for a long time

## 2018-11-13 DIAGNOSIS — E1142 Type 2 diabetes mellitus with diabetic polyneuropathy: Secondary | ICD-10-CM | POA: Diagnosis not present

## 2018-11-13 DIAGNOSIS — G20 Parkinson's disease: Secondary | ICD-10-CM | POA: Diagnosis not present

## 2018-11-13 DIAGNOSIS — E441 Mild protein-calorie malnutrition: Secondary | ICD-10-CM | POA: Diagnosis not present

## 2018-11-13 DIAGNOSIS — E1151 Type 2 diabetes mellitus with diabetic peripheral angiopathy without gangrene: Secondary | ICD-10-CM | POA: Diagnosis not present

## 2018-11-13 DIAGNOSIS — I10 Essential (primary) hypertension: Secondary | ICD-10-CM | POA: Diagnosis not present

## 2018-11-13 DIAGNOSIS — I872 Venous insufficiency (chronic) (peripheral): Secondary | ICD-10-CM | POA: Diagnosis not present

## 2018-12-02 ENCOUNTER — Other Ambulatory Visit: Payer: Medicare Other | Admitting: Licensed Clinical Social Worker

## 2018-12-02 ENCOUNTER — Other Ambulatory Visit: Payer: Self-pay

## 2018-12-02 DIAGNOSIS — Z515 Encounter for palliative care: Secondary | ICD-10-CM

## 2018-12-02 NOTE — Progress Notes (Signed)
COMMUNITY PALLIATIVE CARE SW NOTE  PATIENT NAME: Alan Peterson DOB: [birth year] MRN: [id number]  PRIMARY CARE PROVIDER: [name], MD  RESPONSIBLE PARTY:  Acct ID - Guarantor Home Phone Work Phone Relationship Acct Type  [id number] [name]  Self P/F     [address], [place],  [address]   Due to the COVID-19 crisis, this virtual check-in visit was done via telephone from my office and it was initiated and consent given by thispatientand or family.  PLAN OF CARE and INTERVENTIONS:             1. GOALS OF CARE/ ADVANCE CARE PLANNING:  Goal is for patient to remain at home.  He is a full code. 2. SOCIAL/EMOTIONAL/SPIRITUAL ASSESSMENT/ INTERVENTIONS:  SW conducted a virtual check in visit with patient's son, [name].  He reported patient has not had any hospitalizations since the last contact.  He requires assistance with meal set-up.  Patient's social security income is too high to qualify for home aide assistance.  A home visit with the Palliative Care RN is scheduled for next week. 3. PATIENT/CAREGIVER EDUCATION/ COPING:  Son expresses himself freely. 4. PERSONAL EMERGENCY PLAN:  Son will contact patient's MD. 5. COMMUNITY RESOURCES COORDINATION/ HEALTH CARE NAVIGATION:  None. 6. FINANCIAL/LEGAL CONCERNS/INTERVENTIONS:  He is on a fixed income.     SOCIAL HX:  Social History   Tobacco Use  . Smoking status: Never Smoker  . Smokeless tobacco: Never Used  Substance Use Topics  . Alcohol use: Yes    Comment: beer    CODE STATUS:  Full Code ADVANCED DIRECTIVES:  No MOST FORM COMPLETE:  No HOSPICE EDUCATION PROVIDED:  No PPS:  Patient's appetite is normal.  He uses a walker. Duration of visit and documentation:  30 minutes.      Creola Corn Kynzee Devinney, LCSW

## 2018-12-13 ENCOUNTER — Other Ambulatory Visit: Payer: Self-pay

## 2018-12-13 ENCOUNTER — Other Ambulatory Visit: Payer: Medicare Other | Admitting: *Deleted

## 2018-12-13 ENCOUNTER — Other Ambulatory Visit: Payer: Medicare Other | Admitting: Licensed Clinical Social Worker

## 2018-12-13 DIAGNOSIS — Z515 Encounter for palliative care: Secondary | ICD-10-CM

## 2018-12-13 NOTE — Progress Notes (Signed)
COMMUNITY PALLIATIVE CARE RN NOTE  PATIENT NAME: Alan Peterson DOB: 10-15-1940 MRN: [id number]  PRIMARY CARE PROVIDER: [name], MD  RESPONSIBLE PARTY:  Acct ID - Guarantor Home Phone Work Phone Relationship Acct Type  [id number] [name]  Self P/F     [address], [place], [place] [address]   Covid-19 Pre-screening is Negative  PLAN OF CARE and INTERVENTION:  1. ADVANCE CARE PLANNING/GOALS OF CARE: Goal is for patient to remain in his home. He is a Full Code. 2. PATIENT/CAREGIVER EDUCATION: Explained Palliative Care services, [facility] Precautions, Safe Mobility/Transfers 3. DISEASE STATUS: Joint visit made with Palliative care SW, Lynn Duffy. Met with patient in his home. Upon arrival, he is sitting up in his recliner awake. He is alert and oriented and able to make needs known. He is slow to respond at times and speech can be slightly difficult to understand. He denies pain. No dyspnea noted. He is ambulatory using a walker. He says that he uses a cane when travelling outside of the home and I notice that he also has a wheelchair. He requires assistance with hygiene and dressing. He is able to stand with the assistance of a lift chair, which is where he sleeps. He is able to feed himself. He has had PT in the past and says that he has exercises he tries to perform daily to keep him as mobile as possible. He is stiff and rigid and has a machine called a whole body platform machine, that shakes him in order to loosen his joints/muscles and improve strength. He has intermittent urinary incontinence and wears Depends. Occasional constipation and he says that he takes coconut oil if he hasn't had a BM in a couple of days and is effective. His intake is normal. He says he has some difficulty swallowing at times with food. He takes his medications without difficulty whole with water. He does drool. He has a hired caregiver through [facility] that comes 2x/week from 8a-10a to  assist with personal care needs. Reviewed goals of care and he would like to remain a Full code and wants the full scope of treatment at this time. Spoke with patient's son who confirmed this. Patient and son agreeable to future palliative care visits. Will continue to monitor.   HISTORY OF PRESENT ILLNESS: This is a 78 yo male who resides at home. His son lives next door and visits daily. Palliative care asked to follow patient for additional support. Will visit patient monthly and PRN.    CODE STATUS: Full Code ADVANCED DIRECTIVES: Y MOST FORM: no PPS: 40%   PHYSICAL EXAM:   VITALS: Today's Vitals   12/13/18 1157  BP: (!) 149/78  Pulse: 67  Resp: 16  Temp: 98 F (36.7 C)  TempSrc: Temporal  SpO2: 100%  PainSc: 0-No pain    LUNGS: clear to auscultation  CARDIAC: Cor RRR EXTREMITIES: Trace edema in bilateral ankles SKIN: Exposed skin is dry and intact  NEURO: Alert and oriented x 3, generalized weakness, ambulatory with walker   (Duration of visit and documentation 90 minutes)   Daryl Eastern, RN BSN

## 2018-12-14 NOTE — Progress Notes (Signed)
COMMUNITY PALLIATIVE CARE SW NOTE  PATIENT NAME: Alan Peterson DOB: 06/13/1940 MRN: [id number]  PRIMARY CARE PROVIDER: [name], MD  RESPONSIBLE PARTY:  Acct ID - Guarantor Home Phone Work Phone Relationship Acct Type  [id number] [name]  Self P/F     [address], [place], [place] [address]     PLAN OF CARE and INTERVENTIONS:             1. GOALS OF CARE/ ADVANCE CARE PLANNING:  Patient's goal is to remain in his home and to be as independent as possible.  He is a full code. 2. SOCIAL/EMOTIONAL/SPIRITUAL ASSESSMENT/ INTERVENTIONS:  SW and Palliative Care RN, Daryl Eastern, met with patient in his home.  He was sitting up in his recliner which he also sleeps in.  Patient was alert and oriented and was able to express his needs.  He drooled excessively.  He has his Ph.D. in Chemistry and was born in [place].  He was divorced in 80.  His son, Alan Peterson, lives next door and works in patient's garage.  Patient also has a daughter who lives in [place] and calls patient daily.  His ex-wife lives in [place] and is in charge of his money.  In his spare time, he works on his computer as a Optometrist.  Reviewed goals of care and patient wants all interventions done at this time.  Consulted [name] who confirmed information.  Provided patient and son with Palliative Care folder and business card. 3. PATIENT/CAREGIVER EDUCATION/ COPING:  Provided education regarding the Palliative Care program to patient.  He copes by problem-solving. 4. PERSONAL EMERGENCY PLAN:  He wears a life alert button. 5. COMMUNITY RESOURCES COORDINATION/ HEALTH CARE NAVIGATION:  A [facility] caregiver provides assistance with personal care needs two days a week from 8am-10am. 6. FINANCIAL/LEGAL CONCERNS/INTERVENTIONS:  He is on a fixed income.     SOCIAL HX:  Social History   Tobacco Use  . Smoking status: Never Smoker  . Smokeless tobacco: Never Used  Substance Use Topics  . Alcohol use: Yes   Comment: beer    CODE STATUS:  Full Code ADVANCED DIRECTIVES:  Patient reports having a HCPOA and possibly a LW. MOST FORM COMPLETE:  N HOSPICE EDUCATION PROVIDED: N PPS:  Patient has a normal appetite.  He ambulates with a cane. Duration of visit and documentation:  75 minute      Creola Corn Makaylyn Sinyard, LCSW

## 2018-12-30 ENCOUNTER — Emergency Department: Payer: Medicare Other

## 2018-12-30 ENCOUNTER — Other Ambulatory Visit: Payer: Medicare Other | Admitting: *Deleted

## 2018-12-30 ENCOUNTER — Encounter: Payer: Self-pay | Admitting: Intensive Care

## 2018-12-30 ENCOUNTER — Telehealth: Payer: Self-pay | Admitting: Internal Medicine

## 2018-12-30 ENCOUNTER — Inpatient Hospital Stay
Admission: EM | Admit: 2018-12-30 | Discharge: 2019-01-12 | DRG: 853 | Disposition: E | Payer: Medicare Other | Attending: Internal Medicine | Admitting: Internal Medicine

## 2018-12-30 ENCOUNTER — Other Ambulatory Visit: Payer: Self-pay

## 2018-12-30 DIAGNOSIS — I517 Cardiomegaly: Secondary | ICD-10-CM | POA: Diagnosis not present

## 2018-12-30 DIAGNOSIS — Z4659 Encounter for fitting and adjustment of other gastrointestinal appliance and device: Secondary | ICD-10-CM

## 2018-12-30 DIAGNOSIS — L89312 Pressure ulcer of right buttock, stage 2: Secondary | ICD-10-CM | POA: Diagnosis present

## 2018-12-30 DIAGNOSIS — K661 Hemoperitoneum: Secondary | ICD-10-CM | POA: Diagnosis not present

## 2018-12-30 DIAGNOSIS — Z452 Encounter for adjustment and management of vascular access device: Secondary | ICD-10-CM

## 2018-12-30 DIAGNOSIS — R079 Chest pain, unspecified: Secondary | ICD-10-CM

## 2018-12-30 DIAGNOSIS — I4510 Unspecified right bundle-branch block: Secondary | ICD-10-CM | POA: Diagnosis present

## 2018-12-30 DIAGNOSIS — I468 Cardiac arrest due to other underlying condition: Secondary | ICD-10-CM | POA: Diagnosis not present

## 2018-12-30 DIAGNOSIS — J9602 Acute respiratory failure with hypercapnia: Secondary | ICD-10-CM | POA: Diagnosis not present

## 2018-12-30 DIAGNOSIS — K255 Chronic or unspecified gastric ulcer with perforation: Secondary | ICD-10-CM | POA: Diagnosis not present

## 2018-12-30 DIAGNOSIS — Z20828 Contact with and (suspected) exposure to other viral communicable diseases: Secondary | ICD-10-CM | POA: Diagnosis present

## 2018-12-30 DIAGNOSIS — Z7189 Other specified counseling: Secondary | ICD-10-CM | POA: Diagnosis not present

## 2018-12-30 DIAGNOSIS — K251 Acute gastric ulcer with perforation: Secondary | ICD-10-CM | POA: Diagnosis not present

## 2018-12-30 DIAGNOSIS — K275 Chronic or unspecified peptic ulcer, site unspecified, with perforation: Secondary | ICD-10-CM | POA: Diagnosis not present

## 2018-12-30 DIAGNOSIS — N179 Acute kidney failure, unspecified: Secondary | ICD-10-CM | POA: Diagnosis present

## 2018-12-30 DIAGNOSIS — R4020 Unspecified coma: Secondary | ICD-10-CM | POA: Diagnosis not present

## 2018-12-30 DIAGNOSIS — R1310 Dysphagia, unspecified: Secondary | ICD-10-CM | POA: Diagnosis present

## 2018-12-30 DIAGNOSIS — E860 Dehydration: Secondary | ICD-10-CM | POA: Diagnosis present

## 2018-12-30 DIAGNOSIS — I469 Cardiac arrest, cause unspecified: Secondary | ICD-10-CM | POA: Diagnosis not present

## 2018-12-30 DIAGNOSIS — Z978 Presence of other specified devices: Secondary | ICD-10-CM

## 2018-12-30 DIAGNOSIS — Z66 Do not resuscitate: Secondary | ICD-10-CM | POA: Diagnosis not present

## 2018-12-30 DIAGNOSIS — G9340 Encephalopathy, unspecified: Secondary | ICD-10-CM | POA: Diagnosis not present

## 2018-12-30 DIAGNOSIS — Z515 Encounter for palliative care: Secondary | ICD-10-CM | POA: Diagnosis not present

## 2018-12-30 DIAGNOSIS — L89322 Pressure ulcer of left buttock, stage 2: Secondary | ICD-10-CM | POA: Diagnosis present

## 2018-12-30 DIAGNOSIS — E785 Hyperlipidemia, unspecified: Secondary | ICD-10-CM | POA: Diagnosis present

## 2018-12-30 DIAGNOSIS — R0602 Shortness of breath: Secondary | ICD-10-CM | POA: Diagnosis not present

## 2018-12-30 DIAGNOSIS — R29810 Facial weakness: Secondary | ICD-10-CM | POA: Diagnosis present

## 2018-12-30 DIAGNOSIS — R6521 Severe sepsis with septic shock: Secondary | ICD-10-CM | POA: Diagnosis not present

## 2018-12-30 DIAGNOSIS — N400 Enlarged prostate without lower urinary tract symptoms: Secondary | ICD-10-CM | POA: Diagnosis present

## 2018-12-30 DIAGNOSIS — K631 Perforation of intestine (nontraumatic): Secondary | ICD-10-CM | POA: Diagnosis not present

## 2018-12-30 DIAGNOSIS — I10 Essential (primary) hypertension: Secondary | ICD-10-CM | POA: Diagnosis not present

## 2018-12-30 DIAGNOSIS — J90 Pleural effusion, not elsewhere classified: Secondary | ICD-10-CM | POA: Diagnosis not present

## 2018-12-30 DIAGNOSIS — G20 Parkinson's disease: Secondary | ICD-10-CM | POA: Diagnosis present

## 2018-12-30 DIAGNOSIS — Z4682 Encounter for fitting and adjustment of non-vascular catheter: Secondary | ICD-10-CM | POA: Diagnosis not present

## 2018-12-30 DIAGNOSIS — A419 Sepsis, unspecified organism: Secondary | ICD-10-CM | POA: Diagnosis present

## 2018-12-30 DIAGNOSIS — I248 Other forms of acute ischemic heart disease: Secondary | ICD-10-CM | POA: Diagnosis present

## 2018-12-30 DIAGNOSIS — J690 Pneumonitis due to inhalation of food and vomit: Secondary | ICD-10-CM

## 2018-12-30 DIAGNOSIS — E872 Acidosis: Secondary | ICD-10-CM | POA: Diagnosis present

## 2018-12-30 DIAGNOSIS — J189 Pneumonia, unspecified organism: Secondary | ICD-10-CM | POA: Diagnosis not present

## 2018-12-30 DIAGNOSIS — Z8249 Family history of ischemic heart disease and other diseases of the circulatory system: Secondary | ICD-10-CM

## 2018-12-30 DIAGNOSIS — J9601 Acute respiratory failure with hypoxia: Secondary | ICD-10-CM | POA: Diagnosis not present

## 2018-12-30 DIAGNOSIS — N132 Hydronephrosis with renal and ureteral calculous obstruction: Secondary | ICD-10-CM | POA: Diagnosis not present

## 2018-12-30 LAB — BASIC METABOLIC PANEL
Anion gap: 15 (ref 5–15)
BUN: 40 mg/dL — ABNORMAL HIGH (ref 8–23)
CO2: 18 mmol/L — ABNORMAL LOW (ref 22–32)
Calcium: 9.5 mg/dL (ref 8.9–10.3)
Chloride: 108 mmol/L (ref 98–111)
Creatinine, Ser: 1.96 mg/dL — ABNORMAL HIGH (ref 0.61–1.24)
GFR calc Af Amer: 36 mL/min — ABNORMAL LOW (ref >60)
GFR calc non Af Amer: 31 mL/min — ABNORMAL LOW (ref >60)
Glucose, Bld: 196 mg/dL — ABNORMAL HIGH (ref 70–99)
Potassium: 4.9 mmol/L (ref 3.5–5.1)
Sodium: 141 mmol/L (ref 135–145)

## 2018-12-30 LAB — TROPONIN I (HIGH SENSITIVITY)
Troponin I (High Sensitivity): 34 ng/L — ABNORMAL HIGH (ref <18)
Troponin I (High Sensitivity): 50 ng/L — ABNORMAL HIGH (ref <18)
Troponin I (High Sensitivity): 53 ng/L — ABNORMAL HIGH (ref <18)
Troponin I (High Sensitivity): 54 ng/L — ABNORMAL HIGH (ref <18)

## 2018-12-30 LAB — CBC
HCT: 45.9 % (ref 39.0–52.0)
Hemoglobin: 15.4 g/dL (ref 13.0–17.0)
MCH: 31.8 pg (ref 26.0–34.0)
MCHC: 33.6 g/dL (ref 30.0–36.0)
MCV: 94.8 fL (ref 80.0–100.0)
Platelets: 231 10*3/uL (ref 150–400)
RBC: 4.84 MIL/uL (ref 4.22–5.81)
RDW: 12.9 % (ref 11.5–15.5)
WBC: 5.1 10*3/uL (ref 4.0–10.5)
nRBC: 0 % (ref 0.0–0.2)

## 2018-12-30 LAB — URINALYSIS, COMPLETE (UACMP) WITH MICROSCOPIC
Bacteria, UA: NONE SEEN
Bilirubin Urine: NEGATIVE
Glucose, UA: NEGATIVE mg/dL
Hgb urine dipstick: NEGATIVE
Ketones, ur: 5 mg/dL — AB
Nitrite: NEGATIVE
Protein, ur: 100 mg/dL — AB
Specific Gravity, Urine: 1.023 (ref 1.005–1.030)
pH: 5 (ref 5.0–8.0)

## 2018-12-30 LAB — LACTIC ACID, PLASMA
Lactic Acid, Venous: 6.2 mmol/L (ref 0.5–1.9)
Lactic Acid, Venous: 4.5 mmol/L (ref 0.5–1.9)

## 2018-12-30 MED ORDER — ACETAMINOPHEN 325 MG PO TABS
650.0000 mg | ORAL_TABLET | Freq: Four times a day (QID) | ORAL | Status: DC | PRN
  Administered 2019-01-04 (×2): 650 mg via ORAL
  Filled 2018-12-30 (×2): qty 2

## 2018-12-30 MED ORDER — SODIUM CHLORIDE 0.9 % IV BOLUS
1000.0000 mL | Freq: Once | INTRAVENOUS | Status: AC
  Administered 2018-12-30: 16:00:00 1000 mL via INTRAVENOUS

## 2018-12-30 MED ORDER — FENTANYL CITRATE (PF) 100 MCG/2ML IJ SOLN
INTRAMUSCULAR | Status: AC
  Filled 2018-12-30: qty 2

## 2018-12-30 MED ORDER — SODIUM CHLORIDE 0.9 % IV SOLN
2.0000 g | Freq: Once | INTRAVENOUS | Status: AC
  Administered 2018-12-30: 17:00:00 2 g via INTRAVENOUS
  Filled 2018-12-30: qty 2

## 2018-12-30 MED ORDER — HEPARIN SODIUM (PORCINE) 5000 UNIT/ML IJ SOLN
5000.0000 [IU] | Freq: Three times a day (TID) | INTRAMUSCULAR | Status: DC
  Administered 2018-12-30 – 2019-01-08 (×26): 5000 [IU] via SUBCUTANEOUS
  Filled 2018-12-30 (×26): qty 1

## 2018-12-30 MED ORDER — ACETAMINOPHEN 650 MG RE SUPP: 650.0000 mg | Freq: Four times a day (QID) | RECTAL | Status: DC | PRN

## 2018-12-30 MED ORDER — SODIUM CHLORIDE 0.9 % IV SOLN
INTRAVENOUS | Status: DC
  Administered 2018-12-30 – 2019-01-03 (×8): via INTRAVENOUS

## 2018-12-30 MED ORDER — VANCOMYCIN HCL 500 MG IV SOLR
500.0000 mg | Freq: Once | INTRAVENOUS | Status: AC
  Administered 2018-12-30: 19:00:00 500 mg via INTRAVENOUS
  Filled 2018-12-30: qty 500

## 2018-12-30 MED ORDER — NITROGLYCERIN 0.4 MG SL SUBL: 0.4000 mg | SUBLINGUAL_TABLET | SUBLINGUAL | Status: DC | PRN

## 2018-12-30 MED ORDER — SODIUM CHLORIDE 0.9 % IV SOLN
2.0000 g | INTRAVENOUS | Status: DC
  Filled 2018-12-30: qty 2

## 2018-12-30 MED ORDER — CARBIDOPA-LEVODOPA 25-100 MG PO TABS
1.0000 | ORAL_TABLET | Freq: Three times a day (TID) | ORAL | Status: DC
  Administered 2018-12-30 – 2019-01-01 (×5): 1 via ORAL
  Filled 2018-12-30 (×7): qty 1

## 2018-12-30 MED ORDER — VANCOMYCIN VARIABLE DOSE PER UNSTABLE RENAL FUNCTION (PHARMACIST DOSING): Status: DC

## 2018-12-30 MED ORDER — FENTANYL CITRATE (PF) 100 MCG/2ML IJ SOLN
50.0000 ug | Freq: Once | INTRAMUSCULAR | Status: AC
  Administered 2018-12-30: 50 ug via INTRAVENOUS

## 2018-12-30 MED ORDER — FENTANYL CITRATE (PF) 100 MCG/2ML IJ SOLN
50.0000 ug | Freq: Once | INTRAMUSCULAR | Status: AC
  Administered 2018-12-30: 16:00:00 50 ug via INTRAVENOUS

## 2018-12-30 MED ORDER — VANCOMYCIN HCL IN DEXTROSE 1-5 GM/200ML-% IV SOLN
1000.0000 mg | Freq: Once | INTRAVENOUS | Status: AC
  Administered 2018-12-30: 1000 mg via INTRAVENOUS
  Filled 2018-12-30: qty 200

## 2018-12-30 MED ORDER — DOCUSATE SODIUM 100 MG PO CAPS
100.0000 mg | ORAL_CAPSULE | Freq: Two times a day (BID) | ORAL | Status: DC
  Administered 2018-12-30 – 2019-01-02 (×2): 100 mg via ORAL
  Filled 2018-12-30 (×3): qty 1

## 2018-12-30 MED ORDER — ONDANSETRON HCL 4 MG/2ML IJ SOLN: 4.0000 mg | Freq: Four times a day (QID) | INTRAMUSCULAR | Status: DC | PRN

## 2018-12-30 MED ORDER — SODIUM CHLORIDE 0.9 % IV BOLUS
1000.0000 mL | Freq: Once | INTRAVENOUS | Status: AC
  Administered 2018-12-30: 1000 mL via INTRAVENOUS

## 2018-12-30 MED ORDER — ONDANSETRON HCL 4 MG PO TABS: 4.0000 mg | ORAL_TABLET | Freq: Four times a day (QID) | ORAL | Status: DC | PRN

## 2018-12-30 NOTE — ED Notes (Signed)
Pt c/o returning pain. Dr Estanislado Pandy at bedside. Pt given 50 mcg of fentanyl. Son at the bedside and verbalizes understanding of the admission process and that it may be hours before pt goes upstairs. Pt denies any other needs.

## 2018-12-30 NOTE — Progress Notes (Signed)
COMMUNITY PALLIATIVE CARE RN NOTE  PATIENT NAME: Alan Peterson DOB: 1940/07/18 MRN: [id number]  PRIMARY CARE PROVIDER: [name], MD  RESPONSIBLE PARTY:  Acct ID - Guarantor Home Phone Work Phone Relationship Acct Type  [id number] [name]  Self P/F     [address], [place], [place] [address]   Covid-19 Pre-screening Negative  PLAN OF CARE and INTERVENTION:  1. ADVANCE CARE PLANNING/GOALS OF CARE: Goal is for patient to remain in his home and avoid hospitalizations. He is a full code. 2. PATIENT/CAREGIVER EDUCATION: Dyspnea Management, Reasons for ED visit 3. DISEASE STATUS: Received a message from patient's son Alan Peterson, stating that patient is experiencing shortness of breath and chest pain that radiates from his chest to his shoulders, back and stomach. PRN visit made. Patient is sitting up in his recliner awake and able to engage in conversation, but appears flushed, diaphoretic and in obvious distress. He called his son this am c/o sob and inability to breathe deeply d/t chest pain. Son gave patient Ibuprofen 400 mg around 8am and 200 mg around 12:15p. Pain eased some initially, but is starting to escalate again. Patient states that he does not want to go to the hospital, however he says these symptoms are new. Throughout visit patient kept lifting his chair up and down as he is having difficulty getting comfortable. His breathing continues to be labored and he c/o chest pain and L arm/shoulder pain. Contacted Dr. Alla German office who recommends that patient go to the ED d/t the various conditions that could cause patient's current symptoms. Alan Latin, RN with PCPs office contacted [facility] to make aware that son will be bringing patient by car today. Son wanted to know directions on where he should take patient in order for him to check in. Alan Peterson provided directions to son. Patient was ambulatory yesterday and reports preparing his own meals. He is extremely weak at  time of visit and was unable to stand in order for me to assist him in getting dressed. Myself and son attempted to get patient to ambulate to the car, but patient's legs began to buckle after a few steps, and we had to place him in a nearby chair. We were able to get him into a wheelchair, and a family friend picked patient up and lifted him into the car. Will continue to monitor.  HISTORY OF PRESENT ILLNESS:  This is a 78 yo male who resides at home alone. His son lives next door and checks on him regularly. Palliative care team continues to follow patient. Will continue to visit monthly and PRN.  CODE STATUS: Full code  ADVANCED DIRECTIVES: Y MOST FORM: no PPS: 30%, down from 50% yesterday   PHYSICAL EXAM:   VITALS: Today's Vitals   12/31/2018 1318  BP: 110/60  Pulse: 97  Resp: (!) 24  Temp: 98.5 F (36.9 C)  TempSrc: Temporal  SpO2: 98%  PainSc: 6   PainLoc: Chest    LUNGS: clear to auscultation  CARDIAC: Cor RRR EXTREMITIES: 1+ edema noted to bilateral lower extremities SKIN: Patient is diaphoretic, thin/frail skin  NEURO: Alert and oriented x 3, increased generalized weakness, required 2 person assistance with transfers today   (Duration of visit and documentation 90 minutes)   Alan Eastern, RN BSN

## 2018-12-30 NOTE — ED Notes (Signed)
Spoke to [place] on 2A, attempted to give report, RN has not read report. Notified RN that we would be up on the floor in 15 minutes and if she had any questions about the handoff report to give [place] a call.

## 2018-12-30 NOTE — Progress Notes (Signed)
Advanced care plan.  Purpose of the Encounter: CODE STATUS  Parties in Attendance:Patient and family  Patient's Decision Capacity:Good  Subjective/Patient's story: Alan Peterson  is a 78 y.o. male with a known history of parkinsons disease, BPH , hyperlipidemia presents to ER with generalized weakness and difficulty breathing. Patient also complaints of chest discomfort on deep breathing.. Uses a walker at home for ambulation. No recent travel and sick contacts. Covid 19 test pending.CXR revealed left lung atelectasis.   Objective/Medical story Patient needs work up for sepsis Needs IV abx Needs IV fluids for dehydration   Goals of care determination:  Advance care directives and goals of care discussed Patient and family want everything done which include cpr, intubation and ventilaor if need arises   CODE STATUS: Full code   Time spent discussing advanced care planning: 16 minutes

## 2018-12-30 NOTE — ED Notes (Signed)
Urine collected via in and out cath and sent to the lab. Pt and family given a drink and crcakers. Dr Cherylann Banas at the bedside.

## 2018-12-30 NOTE — Progress Notes (Signed)
PHARMACY -  BRIEF ANTIBIOTIC NOTE   Pharmacy has received consult(s) for  from an ED provider.  The patient's profile has been reviewed for ht/wt/allergies/indication/available labs.    One time order(s) placed for: -Vancomycin 1 g + vancomycin 500 mg (1500 mg total LD) -Cefepime 2 g  Further antibiotics/pharmacy consults should be ordered by admitting physician if indicated.                       Thank you, Benita Gutter 01/07/2019  4:24 PM

## 2018-12-30 NOTE — Telephone Encounter (Signed)
Monisha called back and she is not familiar with [facility] and wants to know where pt should go; I spoke with pts son and he is familiar with [facility] and will take pt to [facility] ED and go inside to ask for help to bring pt into ED by w/c. Nothing further needed.

## 2018-12-30 NOTE — Telephone Encounter (Addendum)
I spoke with Monisha and sounds like pt needs to go to ED for eval and possible testing. Dr Silvio Pate agreed that needs eval and possible CXR etc and pt does need to go to ED. Monishia talked with pt and his son and pts son will take pt to [facility] ED by car and asked that I call [facility] ED to let them know he will be there 45- 60'.  I called [facility] ED and spoke with Casimer Bilis  In ED triage to let her be aware of pt condition and when coming by car to ED. Sidney voiced understanding. fYI to Dr Silvio Pate.

## 2018-12-30 NOTE — ED Notes (Signed)
ED TO INPATIENT HANDOFF REPORT  ED Nurse Name and Phone #: Arlyss Repress, 44  S Name/Age/Gender Alan Peterson 78 y.o. male Room/Bed: ED14A/ED14A  Code Status   Code Status: Not on file  Home/SNF/Other Home Patient oriented to: self, place, time and situation Is this baseline? Yes   Triage Complete: Triage complete  Chief Complaint chest pain  Triage Note Patient c/o sob and bilateral rib/chest pain since 0400AM today. Patient is diaphoretic in triage and moaning. Denies N/V/D   Allergies No Known Allergies  Level of Care/Admitting Diagnosis ED Disposition    ED Disposition Condition Comment   Admit  Hospital Area: [facility] [[id number]]  Level of Care: Telemetry [5]  Covid Evaluation: Asymptomatic Screening Protocol (No Symptoms)  Diagnosis: Sepsis [facility]) [[id number]]  Admitting Physician: Ihor Austin [[id number]]  Attending Physician: Ihor Austin [[id number]]  Estimated length of stay: past midnight tomorrow  Certification:: I certify this patient will need inpatient services for at least 2 midnights  PT Class (Do Not Modify): Inpatient [101]  PT Acc Code (Do Not Modify): Private [1]       B Medical/Surgery History Past Medical History:  Diagnosis Date  . BPH (benign prostatic hypertrophy)   . Hyperlipidemia   . Parkinson's disease    Past Surgical History:  Procedure Laterality Date  . FINGER CONTRACTURE RELEASE  1980   right, left 4th finger release 8/10     A IV Location/Drains/Wounds Patient Lines/Drains/Airways Status   Active Line/Drains/Airways    Name:   Placement date:   Placement time:   Site:   Days:   Peripheral IV 12/16/2018 Right Antecubital   01/11/2019    1523    Antecubital   less than 1   Peripheral IV 12/19/2018 Left Forearm   12/17/2018    1527    Forearm   less than 1          Intake/Output Last 24 hours No intake or output data in the 24 hours ending 12/17/2018 1906  Labs/Imaging Results for orders placed or performed  during the hospital encounter of 12/16/2018 (from the past 48 hour(s))  Basic metabolic panel     Status: Abnormal   Collection Time: 01/01/2019  3:17 PM  Result Value Ref Range   Sodium 141 135 - 145 mmol/L   Potassium 4.9 3.5 - 5.1 mmol/L    Comment: HEMOLYSIS AT THIS LEVEL MAY AFFECT RESULT   Chloride 108 98 - 111 mmol/L   CO2 18 (L) 22 - 32 mmol/L   Glucose, Bld 196 (H) 70 - 99 mg/dL   BUN 40 (H) 8 - 23 mg/dL   Creatinine, Ser 7.12 (H) 0.61 - 1.24 mg/dL   Calcium 9.5 8.9 - 45.8 mg/dL   GFR calc non Af Amer 31 (L) >60 mL/min   GFR calc Af Amer 36 (L) >60 mL/min   Anion gap 15 5 - 15    Comment: Performed at [facility], [address] [place], [place] [address]  CBC     Status: None   Collection Time: 12/28/2018  3:17 PM  Result Value Ref Range   WBC 5.1 4.0 - 10.5 K/uL   RBC 4.84 4.22 - 5.81 MIL/uL   Hemoglobin 15.4 13.0 - 17.0 g/dL   HCT 38.2 50.5 - 39.7 %   MCV 94.8 80.0 - 100.0 fL   MCH 31.8 26.0 - 34.0 pg   MCHC 33.6 30.0 - 36.0 g/dL   RDW 67.3 41.9 - 37.9 %   Platelets  231 150 - 400 K/uL   nRBC 0.0 0.0 - 0.2 %    Comment: Performed at [facility], [address] [place], [place] [address]  Troponin I (High Sensitivity)     Status: Abnormal   Collection Time: 12-May-2018  3:17 PM  Result Value Ref Range   Troponin I (High Sensitivity) 34 (H) <18 ng/L    Comment: (NOTE) Elevated high sensitivity troponin I (hsTnI) values and significant  changes across serial measurements may suggest ACS but many other  chronic and acute conditions are known to elevate hsTnI results.  Refer to the "Links" section for chest pain algorithms and additional  guidance. Performed at [facility], [address] [place], [place] [address]   Urinalysis, Complete w Microscopic     Status: Abnormal   Collection Time: 12-May-2018  3:31 PM  Result Value Ref Range   Color, Urine AMBER (A) YELLOW    Comment: BIOCHEMICALS MAY BE AFFECTED BY COLOR   APPearance CLOUDY (A)  CLEAR   Specific Gravity, Urine 1.023 1.005 - 1.030   pH 5.0 5.0 - 8.0   Glucose, UA NEGATIVE NEGATIVE mg/dL   Hgb urine dipstick NEGATIVE NEGATIVE   Bilirubin Urine NEGATIVE NEGATIVE   Ketones, ur 5 (A) NEGATIVE mg/dL   Protein, ur 098100 (A) NEGATIVE mg/dL   Nitrite NEGATIVE NEGATIVE   Leukocytes,Ua SMALL (A) NEGATIVE   RBC / HPF 0-5 0 - 5 RBC/hpf   WBC, UA 6-10 0 - 5 WBC/hpf   Bacteria, UA NONE SEEN NONE SEEN   Squamous Epithelial / LPF 0-5 0 - 5   Mucus PRESENT     Comment: Performed at [facility], [address] [place], [place] [address]  Lactic acid, plasma     Status: Abnormal   Collection Time: 12-May-2018  3:31 PM  Result Value Ref Range   Lactic Acid, Venous 6.2 (HH) 0.5 - 1.9 mmol/L    Comment: CRITICAL RESULT CALLED TO, READ BACK BY AND VERIFIED WITH Yeraldy Spike AT 1616 01-Mar-202020  TFK Performed at [facility], [address] [place], [place] [address]   Troponin I (High Sensitivity)     Status: Abnormal   Collection Time: 12-May-2018  5:12 PM  Result Value Ref Range   Troponin I (High Sensitivity) 50 (H) <18 ng/L    Comment: (NOTE) Elevated high sensitivity troponin I (hsTnI) values and significant  changes across serial measurements may suggest ACS but many other  chronic and acute conditions are known to elevate hsTnI results.  Refer to the "Links" section for chest pain algorithms and additional  guidance. Performed at [facility], [address] [place], [place] [address]   Lactic acid, plasma     Status: Abnormal   Collection Time: 12-May-2018  5:23 PM  Result Value Ref Range   Lactic Acid, Venous 4.5 (HH) 0.5 - 1.9 mmol/L    Comment: CRITICAL VALUE NOTED. VALUE IS CONSISTENT WITH PREVIOUSLY REPORTED/CALLED VALUE/TFK Performed at [facility], [address] [place], [place] [address]    Dg Chest Portable 1 View  Result Date: 01-Mar-202020 CLINICAL DATA:  Chest pain, shortness of breath. EXAM: PORTABLE CHEST 1  VIEW COMPARISON:  None. FINDINGS: The heart size and mediastinal contours are within normal limits. Right lung is clear. No pneumothorax is noted. Mild left basilar atelectasis is noted with probable small left pleural effusion. The visualized skeletal structures are unremarkable. IMPRESSION: Mild left basilar subsegmental atelectasis with probable small left pleural effusion. Electronically Signed  By: Marijo Conception M.D.   On: 01/01/2019 15:52    Pending Labs Unresulted Labs (From admission, onward)    Start     Ordered   12/31/18 1800  Vancomycin, random  Once-Timed,   STAT     01/10/2019 1859   12/31/18 0500  Creatinine, serum  Tomorrow morning,   STAT     12/29/2018 1858   01/06/2019 1645  SARS CORONAVIRUS 2 (TAT 6-24 HRS) Nasopharyngeal Nasopharyngeal Swab  (Asymptomatic/Tier 2 Patients Labs)  Once,   STAT    Question Answer Comment  Is this test for diagnosis or screening Diagnosis of ill patient   Symptomatic for COVID-19 as defined by CDC No   Hospitalized for COVID-19 No   Admitted to ICU for COVID-19 No   Previously tested for COVID-19 No   Resident in a congregate (group) care setting No   Employed in healthcare setting No      12/26/2018 1644   01/02/2019 1618  Blood Culture (routine x 2)  BLOOD CULTURE X 2,   STAT     12/20/2018 1617   01/07/2019 1528  Brain natriuretic peptide  Once,   STAT     01/07/2019 1527   Signed and Held  CBC  (heparin)  Once,   R    Comments: Baseline for heparin therapy IF NOT ALREADY DRAWN.  Notify MD if PLT < 100 K.    Signed and Held   Signed and Held  Creatinine, serum  (heparin)  Once,   R    Comments: Baseline for heparin therapy IF NOT ALREADY DRAWN.    Signed and Held   Signed and Held  Basic metabolic panel  Tomorrow morning,   R     Signed and Held   Signed and Held  CBC  Tomorrow morning,   R     Signed and Held          Vitals/Pain Today's Vitals   12/28/2018 1800 01/07/2019 1830 12/14/2018 1834 12/23/2018 1845  BP: 131/60 114/68  123/63   Pulse: 89 84  86  Resp: (!) 21 (!) 22  (!) 29  Temp:      TempSrc:      SpO2: 90% 98%  96%  Weight:      Height:      PainSc:   5      Isolation Precautions No active isolations  Medications Medications  vancomycin (VANCOCIN) 500 mg in sodium chloride 0.9 % 100 mL IVPB (500 mg Intravenous New Bag/Given 12/17/2018 1841)  nitroGLYCERIN (NITROSTAT) SL tablet 0.4 mg (has no administration in time range)  vancomycin variable dose per unstable renal function (pharmacist dosing) (has no administration in time range)  ceFEPIme (MAXIPIME) 2 g in sodium chloride 0.9 % 100 mL IVPB (has no administration in time range)  sodium chloride 0.9 % bolus 1,000 mL (0 mLs Intravenous Stopped 12/18/2018 1629)  fentaNYL (SUBLIMAZE) injection 50 mcg (50 mcg Intravenous Given 12/29/2018 1535)  sodium chloride 0.9 % bolus 1,000 mL (0 mLs Intravenous Stopped 12/15/2018 1738)  ceFEPIme (MAXIPIME) 2 g in sodium chloride 0.9 % 100 mL IVPB (0 g Intravenous Stopped 12/31/2018 1701)  vancomycin (VANCOCIN) IVPB 1000 mg/200 mL premix (0 mg Intravenous Stopped 12/26/2018 1838)  fentaNYL (SUBLIMAZE) injection 50 mcg (50 mcg Intravenous Given 01/07/2019 1758)    Mobility walks with device Moderate fall risk   Focused Assessments          R Recommendations: See Admitting Provider Note  Report given to:  Additional Notes: N/A

## 2018-12-30 NOTE — Progress Notes (Signed)
Asked bedside RN as elink is following pt as a sepsis to ask MD that is following pt if they would like to order follow up LA due to previous elevated LA

## 2018-12-30 NOTE — Telephone Encounter (Signed)
I will see what happens with the ER evaluation. Could be pneumonia, PE, etc with the pleuritic pain and breathing issues

## 2018-12-30 NOTE — Progress Notes (Signed)
CODE SEPSIS - PHARMACY COMMUNICATION  **Broad Spectrum Antibiotics should be administered within 1 hour of Sepsis diagnosis**  Time Code Sepsis Called/Page Received: 1617  Antibiotics Ordered: Cefepime & Vancomycin  Time of 1st antibiotic administration: 1631  Additional action taken by pharmacy: [place] Resident Jan 22, 2019  4:35 PM

## 2018-12-30 NOTE — ED Notes (Signed)
Per son, pt called him at 0430 to report chest pain. Pain has been moving from chest to abdomen and into his shoulders bilaterally. Pt is diaphoretic and breathing is labored. Pt has no medical issues aside from parkinsons. Pt lives alone but son lives next door.

## 2018-12-30 NOTE — Consult Note (Signed)
Pharmacy Antibiotic Note  Alan Peterson is a 78 y.o. male admitted on 2019-01-15 with sepsis.  Pharmacy has been consulted for Cefepime and Vancomycin dosing.  Patient's serum creatinine is elevated.   Plan: 1. Cefepime 2g Q24H. Will follow Scr with AM labs.   2. Vancomycin will be based on random levels d/t renal function. Will follow up on renal function with AM labs.   Height: 5\' 9"  (175.3 cm) Weight: 153 lb (69.4 kg) IBW/kg (Calculated) : 70.7  Temp (24hrs), Avg:98.2 F (36.8 C), Min:97.8 F (36.6 C), Max:98.5 F (36.9 C)  Recent Labs  Lab Jan 15, 2019 1517 January 15, 2019 1531 Jan 15, 2019 1723  WBC 5.1  --   --   CREATININE 1.96*  --   --   LATICACIDVEN  --  6.2* 4.5*    Estimated Creatinine Clearance: 29.5 mL/min (A) (by C-G formula based on SCr of 1.96 mg/dL (H)).    No Known Allergies   Thank you for allowing pharmacy to be a part of this patient's care.  Rowland Lathe Jan 15, 2019 6:55 PM

## 2018-12-30 NOTE — ED Triage Notes (Addendum)
Patient c/o sob and bilateral rib/chest pain since 0400AM today. Patient is diaphoretic in triage and moaning. Denies N/V/D

## 2018-12-30 NOTE — Telephone Encounter (Signed)
error 

## 2018-12-30 NOTE — ED Notes (Signed)
Date and time results received: 01-15-19 1616 (use smartphrase ".now" to insert current time)  Test: Lactic Acid Critical Value: 6.2  Name of Provider Notified: Cherylann Banas, MD  Orders Received? Or Actions Taken?: Actions Taken: Blood Cultures

## 2018-12-30 NOTE — H&P (Addendum)
[facility] NAME: Alan Peterson    MR#:  [id number]  DATE OF BIRTH:  12/15/1940  DATE OF ADMISSION:  Jan 28, 2019  PRIMARY CARE PHYSICIAN: [name], MD   REQUESTING/REFERRING PHYSICIAN:   CHIEF COMPLAINT:   Chief Complaint  Patient presents with  . Shortness of Breath  . Chest Pain    HISTORY OF PRESENT ILLNESS: Alan Peterson  is a 78 y.o. male with a known history of parkinsons disease, BPH , hyperlipidemia presents to ER with generalized weakness and difficulty breathing. Patient also complaints of chest discomfort on deep breathing.. Uses a walker at home for ambulation. No recent travel and sick contacts. Covid 19 test pending.CXR revealed left lung atelectasis.  PAST MEDICAL HISTORY:   Past Medical History:  Diagnosis Date  . BPH (benign prostatic hypertrophy)   . Hyperlipidemia   . Parkinson's disease     PAST SURGICAL HISTORY:  Past Surgical History:  Procedure Laterality Date  . FINGER CONTRACTURE RELEASE  1980   right, left 4th finger release 8/10    SOCIAL HISTORY:  Social History   Tobacco Use  . Smoking status: Never Smoker  . Smokeless tobacco: Never Used  Substance Use Topics  . Alcohol use: Not Currently    Comment: beer    FAMILY HISTORY:  Family History  Problem Relation Age of Onset  . Hypertension Mother   . Coronary artery disease Neg Hx   . Diabetes Neg Hx   . Cancer Neg Hx     DRUG ALLERGIES: No Known Allergies  REVIEW OF SYSTEMS:   CONSTITUTIONAL: No fever,has fatigue and weakness.  EYES: No blurred or double vision.  EARS, NOSE, AND THROAT: No tinnitus or ear pain.  RESPIRATORY: No cough, has shortness of breath,  No wheezing or hemoptysis.  CARDIOVASCULAR: Has chest pain, no orthopnea, edema.  GASTROINTESTINAL: No nausea, vomiting, diarrhea or abdominal pain.  GENITOURINARY: No dysuria, hematuria.  ENDOCRINE: No polyuria, nocturia,  HEMATOLOGY: No anemia,  easy bruising or bleeding SKIN: No rash or lesion. MUSCULOSKELETAL: No joint pain or arthritis.   NEUROLOGIC: No tingling, numbness, weakness.  PSYCHIATRY: No anxiety or depression.   MEDICATIONS AT HOME:  Prior to Admission medications   Medication Sig Start Date End Date Taking? Authorizing Provider  carbidopa-levodopa (SINEMET IR) 25-100 MG tablet TAKE 1 TABLET BY MOUTH THREE TIMES A DAY Patient taking differently: Take 1 tablet by mouth 3 (three) times daily.  08/21/18  Yes [name], MD  silver sulfADIAZINE (SILVADENE) 1 % cream APPLY TO AFFECTED AREA EVERY DAY Patient taking differently: Apply 1 application topically daily as needed (skin irritations).  10/22/18  Yes [name], MD  cephALEXin (KEFLEX) 500 MG capsule Take 1 capsule (500 mg total) by mouth 3 (three) times daily. Patient not taking: Reported on Jan 28, 2019 07/03/18   [name], MD      PHYSICAL EXAMINATION:   VITAL SIGNS: Blood pressure (!) 118/57, pulse 82, temperature 98.2 F (36.8 C), temperature source Oral, resp. rate (!) 25, height 5\' 9"  (1.753 m), weight 69.4 kg, SpO2 100 %.  GENERAL:  77 y.o.-year-old patient lying in the bed with no acute distress.  EYES: Pupils equal, round, reactive to light and accommodation. No scleral icterus. Extraocular muscles intact.  HEENT: Head atraumatic, normocephalic. Oropharynx and nasopharynx clear.  NECK:  Supple, no jugular venous distention. No thyroid enlargement, no tenderness.  LUNGS: Decreased breath sounds bilaterally, rales heard in left lung. No  use of accessory muscles of respiration.  CARDIOVASCULAR: S1, S2 normal. No murmurs, rubs, or gallops.  ABDOMEN: Soft, nontender, nondistended. Bowel sounds present. No organomegaly or mass.  EXTREMITIES: No pedal edema, cyanosis, or clubbing.  NEUROLOGIC: Cranial nerves II through XII are intact. Muscle strength 5/5 in all extremities. Sensation intact. Gait not checked.  PSYCHIATRIC: The patient is  alert and oriented x 3.  SKIN: No obvious rash, lesion, or ulcer.   LABORATORY PANEL:   CBC Recent Labs  Lab 21-Jan-2019 1517  WBC 5.1  HGB 15.4  HCT 45.9  PLT 231  MCV 94.8  MCH 31.8  MCHC 33.6  RDW 12.9   ------------------------------------------------------------------------------------------------------------------  Chemistries  Recent Labs  Lab 2019-01-21 1517  NA 141  K 4.9  CL 108  CO2 18*  GLUCOSE 196*  BUN 40*  CREATININE 1.96*  CALCIUM 9.5   ------------------------------------------------------------------------------------------------------------------ estimated creatinine clearance is 29.5 mL/min (A) (by C-G formula based on SCr of 1.96 mg/dL (H)). ------------------------------------------------------------------------------------------------------------------ No results for input(s): TSH, T4TOTAL, T3FREE, THYROIDAB in the last 72 hours.  Invalid input(s): FREET3   Coagulation profile No results for input(s): INR, PROTIME in the last 168 hours. ------------------------------------------------------------------------------------------------------------------- No results for input(s): DDIMER in the last 72 hours. -------------------------------------------------------------------------------------------------------------------  Cardiac Enzymes No results for input(s): CKMB, TROPONINI, MYOGLOBIN in the last 168 hours.  Invalid input(s): CK ------------------------------------------------------------------------------------------------------------------ Invalid input(s): POCBNP  ---------------------------------------------------------------------------------------------------------------  Urinalysis    Component Value Date/Time   COLORURINE AMBER (A) 01/21/2019 1531   APPEARANCEUR CLOUDY (A) 01/21/19 1531   LABSPEC 1.023 2019/01/21 1531   PHURINE 5.0 01-21-2019 1531   GLUCOSEU NEGATIVE 01-21-19 1531   HGBUR NEGATIVE 01/21/2019 1531   HGBUR  negative 11/22/2009 1040   BILIRUBINUR NEGATIVE 01-21-2019 1531   KETONESUR 5 (A) 2019/01/21 1531   PROTEINUR 100 (A) Jan 21, 2019 1531   UROBILINOGEN 0.2 11/22/2009 1040   NITRITE NEGATIVE 2019/01/21 1531   LEUKOCYTESUR SMALL (A) 2019/01/21 1531     RADIOLOGY: Dg Chest Portable 1 View  Result Date: 01-21-2019 CLINICAL DATA:  Chest pain, shortness of breath. EXAM: PORTABLE CHEST 1 VIEW COMPARISON:  None. FINDINGS: The heart size and mediastinal contours are within normal limits. Right lung is clear. No pneumothorax is noted. Mild left basilar atelectasis is noted with probable small left pleural effusion. The visualized skeletal structures are unremarkable. IMPRESSION: Mild left basilar subsegmental atelectasis with probable small left pleural effusion. Electronically Signed   By: Lupita Raider M.D.   On: 2019-01-21 15:52    EKG: Orders placed or performed during the hospital encounter of 2019/01/21  . EKG 12-Lead  . EKG 12-Lead  . ED EKG  . ED EKG  . Repeat EKG  . Repeat EKG  . EKG 12-Lead  . EKG 12-Lead    IMPRESSION AND PLAN:  78 yr old male patient with a known history of parkinsons disease, BPH , hyperlipidemia presents to ER with generalized weakness and difficulty breathing. Patient also complaints of chest discomfort on deep breathing..  -Sepsis Has elevated lactate level (could be from dehydration) covid 19 pending IV fluids Start broad spectrum abx F/U cultures and UA  -Left lung early pneumonia Broad spectrum abx  -Atypical chest pain Could be from pneumonia Mildly elevated troponins could be from demand ischemia Cycle troponins  -Dehydration IV fluids  -DVT prophylaxis with [place] heparin  All the records are reviewed and case discussed with ED provider. Management plans discussed with the patient, family and they are in agreement.  CODE STATUS:Full code Advance Directive Documentation  Most Recent Value  Type of Advance Directive  Living will   Pre-existing out of facility DNR order (yellow form or pink MOST form)  -  "MOST" Form in Place?  -       TOTAL TIME TAKING CARE OF THIS PATIENT: 52 minutes.    Ihor AustinPavan Erynne Kealey M.D on 24-Jul-2018 at 6:05 PM  Between 7am to 6pm - Pager - [phone]  After 6pm go to [email or web address] - password [email or web address] [facility]  [facility]  Office  [phone]  CC: Primary care physician; Karie SchwalbeLetvak, Richard I, MD

## 2018-12-30 NOTE — ED Notes (Signed)
Pt son made aware that the pt has been assigned a room, and will receive home meds once he has arrived in his room.

## 2018-12-30 NOTE — Telephone Encounter (Signed)
Monishia with Palliative care called today in regards to Alan Peterson. She stated she was asked to come out to his home to visit him due to him having difficulty breathing. Patient is stating that his chest is hurting and pain is moving into in his shoulders,back and stomach area. Patient is unable able to take deep breaths. Monishia stated that the patient has stated he wanted to stay at home and did not want to go into the hospital

## 2018-12-30 NOTE — ED Provider Notes (Signed)
[facility] Emergency Department Provider Note ____________________________________________   None    (approximate)  I have reviewed the triage vital signs and the nursing notes.   HISTORY  Chief Complaint Shortness of Breath and Chest Pain    HPI Alan Peterson is a 78 y.o. male with PMH as noted below who presents with chest pain, acute onset around 4 AM, initially substernal but then moving around to his back and now to his upper abdomen.  It is associated with shortness of breath, but not with cough or fever.  He has had no vomiting or diarrhea.  Per the son, the patient has Parkinson's and his mobility is limited but otherwise has no active medical issues.  Past Medical History:  Diagnosis Date  . BPH (benign prostatic hypertrophy)   . Hyperlipidemia   . Parkinson's disease     Patient Active Problem List   Diagnosis Date Noted  . Sepsis (HCC) 01/07/2019  . Decubitus ulcer of buttock, stage 2 (HCC) 07/03/2018  . 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 Malnutrition of mild degree (HCC) 06/04/2017  . 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 Risk for falls 09/10/2015  . Candidal intertrigo 12/14/2014  . Advance directive discussed with patient 04/24/2014  . Gait difficulty 01/07/2014  . PN (peripheral neuropathy) 01/07/2014  . Routine general medical examination at a health care facility 08/29/2010  . Chronic venous insufficiency 12/24/2009  . Essential hypertension, benign 09/07/2009  . BPH with obstruction/lower urinary tract symptoms 06/14/2009  . HYPERLIPIDEMIA 08/30/2007  . Impaired fasting glucose 01/30/2007  . 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 FINGER CONTRACTURE RELEASE  1980   right, left 4th finger release 8/10    Prior to Admission medications   Medication Sig Start Date End Date Taking? Authorizing Provider  carbidopa-levodopa (SINEMET IR) 25-100 MG tablet TAKE 1  TABLET BY MOUTH THREE TIMES A DAY Patient taking differently: Take 1 tablet by mouth 3 (three) times daily.  08/21/18  Yes Karie Schwalbe, MD  silver sulfADIAZINE (SILVADENE) 1 % cream APPLY TO AFFECTED AREA EVERY DAY Patient taking differently: Apply 1 application topically daily as needed (skin irritations).  10/22/18  Yes Karie Schwalbe, MD  cephALEXin (KEFLEX) 500 MG capsule Take 1 capsule (500 mg total) by mouth 3 (three) times daily. Patient not taking: Reported on 01/11/2019 07/03/18   Karie Schwalbe, MD    Allergies Patient has no known allergies.  Family History  Problem Relation Age of Onset  . Hypertension Mother   . Coronary artery disease Neg Hx   . Diabetes Neg Hx   . Cancer Neg Hx     Social History Social History   Tobacco Use  . Smoking status: Never Smoker  . Smokeless tobacco: Never Used  Substance Use Topics  . Alcohol use: Not Currently    Comment: beer  . Drug use: Not Currently    Review of Systems  Constitutional: No fever. Eyes: No redness. ENT: No sore throat. Cardiovascular: Positive for chest pain. Respiratory: Positive for shortness of breath. Gastrointestinal: No vomiting or diarrhea.  Genitourinary: Negative for dysuria.  Musculoskeletal: Positive for back pain. Skin: Negative for rash. Neurological: Negative for headache.   ____________________________________________   PHYSICAL EXAM:  VITAL SIGNS: ED Triage Vitals  Enc Vitals Group     BP 01/04/2019 1509 (!) 86/53     Pulse Rate 12/21/2018 1509 100     Resp 12/13/2018 1509 (!)  26     Temp 01/05/2019 1509 97.8 F (36.6 C)     Temp Source 01/07/2019 1509 Oral     SpO2 12/29/2018 1508 96 %     Weight 12/21/2018 1510 153 lb (69.4 kg)     Height 12/31/2018 1510 5\' 9"  (1.753 m)     Head Circumference --      Peak Flow --      Pain Score 01/02/2019 1510 8     Pain Loc --      Pain Edu? --      Excl. in GC? --     Constitutional: Alert and oriented.  Uncomfortable appearing.   Eyes:  Conjunctivae are normal.  No scleral icterus. Head: Atraumatic. Nose: No congestion/rhinnorhea. Mouth/Throat: Mucous membranes are dry.   Neck: Normal range of motion.  Cardiovascular: Tachycardic, regular rhythm. Grossly normal heart sounds.  Good peripheral circulation. Respiratory: Increased respiratory effort.  Lungs CTAB. Gastrointestinal: Soft and nontender. No distention.  Genitourinary: No flank tenderness. Musculoskeletal: Trace bilateral lower extremity edema.  Extremities warm and well perfused.  Neurologic: Motor intact in all extremities.  Normal speech. Skin:  Skin is warm and dry. No rash noted. Psychiatric: Speech and behavior are normal.   ____________________________________________   LABS (all labs ordered are listed, but only abnormal results are displayed)  Labs Reviewed  BASIC METABOLIC PANEL - Abnormal; Notable for the following components:      Result Value   CO2 18 (*)    Glucose, Bld 196 (*)    BUN 40 (*)    Creatinine, Ser 1.96 (*)    GFR calc non Af Amer 31 (*)    GFR calc Af Amer 36 (*)    All other components within normal limits  URINALYSIS, COMPLETE (UACMP) WITH MICROSCOPIC - Abnormal; Notable for the following components:   Color, Urine AMBER (*)    APPearance CLOUDY (*)    Ketones, ur 5 (*)    Protein, ur 100 (*)    Leukocytes,Ua SMALL (*)    All other components within normal limits  LACTIC ACID, PLASMA - Abnormal; Notable for the following components:   Lactic Acid, Venous 6.2 (*)    All other components within normal limits  LACTIC ACID, PLASMA - Abnormal; Notable for the following components:   Lactic Acid, Venous 4.5 (*)    All other components within normal limits  TROPONIN I (HIGH SENSITIVITY) - Abnormal; Notable for the following components:   Troponin I (High Sensitivity) 34 (*)    All other components within normal limits  TROPONIN I (HIGH SENSITIVITY) - Abnormal; Notable for the following components:   Troponin I (High  Sensitivity) 50 (*)    All other components within normal limits  CULTURE, BLOOD (ROUTINE X 2)  CULTURE, BLOOD (ROUTINE X 2)  SARS CORONAVIRUS 2 (TAT 6-24 HRS)  CBC  BRAIN NATRIURETIC PEPTIDE   ____________________________________________  EKG  ED ECG REPORT I, Dionne BucySebastian Taralyn Ferraiolo, the attending physician, personally viewed and interpreted this ECG.  Date: 12/14/2018 EKG Time: 1530 Rate: 100 Rhythm: normal sinus rhythm QRS Axis: normal Intervals: RBBB ST/T Wave abnormalities: normal Narrative Interpretation: no evidence of acute ischemia  ____________________________________________  RADIOLOGY  CXR: Left basilar atelectasis with possible small left pleural effusion  ____________________________________________   PROCEDURES  Procedure(s) performed: No  Procedures  Critical Care performed: Yes  CRITICAL CARE Performed by: Dionne BucySebastian Loura Pitt   Total critical care time: 30 minutes  Critical care time was exclusive of separately billable procedures and treating other  patients.  Critical care was necessary to treat or prevent imminent or life-threatening deterioration.  Critical care was time spent personally by me on the following activities: development of treatment plan with patient and/or surrogate as well as nursing, discussions with consultants, evaluation of patient's response to treatment, examination of patient, obtaining history from patient or surrogate, ordering and performing treatments and interventions, ordering and review of laboratory studies, ordering and review of radiographic studies, pulse oximetry and re-evaluation of patient's condition. ____________________________________________   INITIAL IMPRESSION / ASSESSMENT AND PLAN / ED COURSE  Pertinent labs & imaging results that were available during my care of the patient were reviewed by me and considered in my medical decision making (see chart for details).  79 year old male with PMH as noted  above presents with acute onset of chest pain and shortness of breath this morning.  The son reports that he uses a walker due to his Parkinson's but otherwise is normally fully alert and has no other active medical issues.  On exam the patient is extremely uncomfortable appearing, somewhat diaphoretic, weak, and tachypneic.  He reports active chest pain.  He was initially hypotensive in triage and tachycardic.  The abdomen is soft and nontender.  He has dry mucous membranes.  Neurologic exam is nonfocal and the patient is alert and able to answer questions.  The remainder of the exam is as described above.  Initial EKG was read by the computer as possible STEMI, however the baseline was very poor quality.  Repeat EKG after few minutes shows RBBB with no significant ischemic changes.  Differential is broad but includes ACS, pneumonia, bronchitis, or less likely PE or vascular etiology such as aortic dissection given the somewhat migratory nature of the pain.  We will obtain a chest x-ray, lab work-up, give fluids and analgesia, and reassess.  ----------------------------------------- 4:35 PM on 01/10/2019 -----------------------------------------  The patient's pain has significantly improved with a dose of fentanyl, and his vital signs have normalized.  Blood pressure and heart rate are now normal and he appears much more comfortable on reassessment.  Crossover between the lab results in epic is down at this time.  Lab results so far: CO2 18, glucose 196, BUN 40, creatinine 1.96, anion gap 15, troponin 34, lactic acid 6.2, CBC all values within reference range.  ----------------------------------------- 6:12 PM on 12/12/2018 -----------------------------------------  Lactic acid was significantly elevated and is improving after fluids.  Chest x-ray shows basilar atelectasis versus infiltrate, with possible small effusion, and the UA shows significant WBCs as well.  Antibiotics have been  ordered for broad-spectrum coverage, and I have discussed the patient with the hospitalist for admission.  Given the elevated creatinine, I cannot obtain a CT with contrast at this time.  However, based on the patient's normalized vital signs, his overall comfortable appearance, and the lack of widened mediastinum on the chest x-ray, at this time I do not suspect aortic dissection, PE, or other vascular etiology.    ____________________________________________   FINAL CLINICAL IMPRESSION(S) / ED DIAGNOSES  Final diagnoses:  Chest pain, unspecified type  Sepsis without acute organ dysfunction, due to unspecified organism [facility])      NEW MEDICATIONS STARTED DURING THIS VISIT:  New Prescriptions   No medications on file     Note:  This document was prepared using Dragon voice recognition software and may include unintentional dictation errors.   Arta Silence, MD 12/25/2018 [phone]

## 2018-12-30 NOTE — ED Notes (Signed)
Blood cultures sent to the lab

## 2018-12-31 LAB — CBC
HCT: 41 % (ref 39.0–52.0)
Hemoglobin: 13.8 g/dL (ref 13.0–17.0)
MCH: 31.2 pg (ref 26.0–34.0)
MCHC: 33.7 g/dL (ref 30.0–36.0)
MCV: 92.8 fL (ref 80.0–100.0)
Platelets: 168 10*3/uL (ref 150–400)
RBC: 4.42 MIL/uL (ref 4.22–5.81)
RDW: 13.1 % (ref 11.5–15.5)
WBC: 6.9 10*3/uL (ref 4.0–10.5)
nRBC: 0 % (ref 0.0–0.2)

## 2018-12-31 LAB — BASIC METABOLIC PANEL
Anion gap: 12 (ref 5–15)
BUN: 57 mg/dL — ABNORMAL HIGH (ref 8–23)
CO2: 17 mmol/L — ABNORMAL LOW (ref 22–32)
Calcium: 8.3 mg/dL — ABNORMAL LOW (ref 8.9–10.3)
Chloride: 114 mmol/L — ABNORMAL HIGH (ref 98–111)
Creatinine, Ser: 1.89 mg/dL — ABNORMAL HIGH (ref 0.61–1.24)
GFR calc Af Amer: 38 mL/min — ABNORMAL LOW (ref >60)
GFR calc non Af Amer: 33 mL/min — ABNORMAL LOW (ref >60)
Glucose, Bld: 102 mg/dL — ABNORMAL HIGH (ref 70–99)
Potassium: 5 mmol/L (ref 3.5–5.1)
Sodium: 143 mmol/L (ref 135–145)

## 2018-12-31 LAB — SARS CORONAVIRUS 2 (TAT 6-24 HRS): SARS Coronavirus 2: NEGATIVE

## 2018-12-31 LAB — LACTIC ACID, PLASMA
Lactic Acid, Venous: 1.9 mmol/L (ref 0.5–1.9)
Lactic Acid, Venous: 2.8 mmol/L (ref 0.5–1.9)

## 2018-12-31 LAB — BRAIN NATRIURETIC PEPTIDE: B Natriuretic Peptide: 154 pg/mL — ABNORMAL HIGH (ref 0.0–100.0)

## 2018-12-31 LAB — MRSA PCR SCREENING: MRSA by PCR: NEGATIVE

## 2018-12-31 LAB — PROCALCITONIN: Procalcitonin: 51.56 ng/mL

## 2018-12-31 MED ORDER — SODIUM CHLORIDE 0.9 % IV SOLN
3.0000 g | Freq: Four times a day (QID) | INTRAVENOUS | Status: DC
  Filled 2018-12-31 (×3): qty 8

## 2018-12-31 MED ORDER — SODIUM CHLORIDE 0.9 % IV SOLN
3.0000 g | Freq: Two times a day (BID) | INTRAVENOUS | Status: DC
  Administered 2018-12-31 – 2019-01-05 (×10): 3 g via INTRAVENOUS
  Filled 2018-12-31 (×2): qty 3
  Filled 2018-12-31 (×2): qty 8
  Filled 2018-12-31 (×3): qty 3
  Filled 2018-12-31: qty 8
  Filled 2018-12-31 (×3): qty 3

## 2018-12-31 MED ORDER — HYDROCERIN EX CREA
TOPICAL_CREAM | Freq: Two times a day (BID) | CUTANEOUS | Status: DC
  Administered 2018-12-31 – 2019-01-04 (×9): via TOPICAL
  Administered 2019-01-05 – 2019-01-06 (×2): 1 via TOPICAL
  Administered 2019-01-06 – 2019-01-08 (×4): via TOPICAL
  Filled 2018-12-31: qty 113

## 2018-12-31 NOTE — Consult Note (Signed)
[facility] Nurse wound consult note Reason for Consult: Stage 2 pressure injury to buttock, boggy periphery. Discoloration to periphery (purple) may be indicative of a deeper lesion; will provide a mattress replacement and twice daily care in an attempt to reverse potential for further injury. Wound type:Pressure, Shear Pressure Injury POA: Yes Measurement:1cm x 1cm x 0.1cm  Wound YQM:GNOI, moist Drainage (amount, consistency, odor) scant serous Periwound:light purple discoloration Dressing procedure/placement/frequency: I will provide Nursing with topical care orders for the open area using xeroform gauze topped with a silicone foam dressing for atraumatic dressing changes.  I have ordered a mattress replacement with low air loss feature and Prevalon boots for prevention of heel injuries while in bed. Turning and repositioning is in place.  [facility] nursing team will not follow, but will remain available to this patient, the nursing and medical teams.  Please re-consult if needed. Thanks, Maudie Flakes, MSN, RN, [place], Arther Abbott  Pager# [phone]

## 2018-12-31 NOTE — Evaluation (Signed)
Physical Therapy Evaluation Patient Details Name: Alan Peterson MRN: [id number] DOB: 05/14/1940 Today's Date: 12/31/2018   History of Present Illness  Pt is an 78 y/o M presented to the ED on 10/19 with complaints of shortness of breath and bilateral rib and chest pain. 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 Imaging revealed L basilar atelectasis w/L pleural effusion, possibly indicative of early pneumonia, which MD suspects may be contributing to chest pain.  Clinical Impression  Pt is an 78 y/o M presenting to the ED on 10/19 with SOB and bilateral rib and chest pain.  Pt performs bed mobility requiring max A for rolling, scooting up in bed for repositioning and supine>sit. Verbal and tactile cuing provided for sequencing with pt moderately receptive. Once sitting edge of bed, pt requiring mod/max A for trunk management with support of UEs on bedrail. Deferred transfer and gait trials today for safety concerns. However, per reports from son in ED, pt was previously performing household ambulation with a RW and lives alone; pt endorses he lives alone requiring occasional assistance from son (who lives next door) for ADLs (meal preparation, etc.).  Pt will benefit from skilled PT intervention to address deficits with strength, balance, range of motion, and safety awareness to promote safe functional mobility.      Follow Up Recommendations SNF    Equipment Recommendations  Rolling walker with 5" wheels;Wheelchair (measurements PT);Wheelchair cushion (measurements PT)(pt reports he has a WC when asked)    Recommendations for Other Services       Precautions / Restrictions Precautions Precautions: Fall Restrictions Weight Bearing Restrictions: No      Mobility  Bed Mobility Overal bed mobility: Needs Assistance Bed Mobility: Supine to Sit;Rolling Rolling: Max assist   Supine to sit: Max  assist     General bed mobility comments: Requiring max A for rolling and supine>sit, with limited ability to follow commands and tactile cuing for sequencing.  Transfers                 General transfer comment: Deferred  Ambulation/Gait             General Gait Details: Deferred  Stairs            Wheelchair Mobility    Modified Rankin (Stroke Patients Only)       Balance Overall balance assessment: Needs assistance Sitting-balance support: Bilateral upper extremity supported;Feet unsupported Sitting balance-Leahy Scale: Poor Sitting balance - Comments: Pt requiring mod/max A to sit edge of bed using handrail for support Postural control: Posterior lean     Standing balance comment: Deferred                             Pertinent Vitals/Pain Pain Assessment: 0-10 Pain Location: Reports pain pointing to his abdomen; pt rates pain 0-10 but unable to understand his rating due to impaired speech Pain Descriptors / Indicators: Discomfort Pain Intervention(s): Limited activity within patient's tolerance;Repositioned;Monitored during session    Home Living Family/patient expects to be discharged to:: Private residence Living Arrangements: Alone(states that son lives next door) Available Help at Discharge: Family Type of Home: House         Home Equipment: Environmental consultant - 2 wheels;Wheelchair - manual Additional Comments: Difficulty obtaining subjective history secondary to pt's impaired speech (Parkinson's)    Prior Function Level of Independence: Independent with assistive device(s)  Comments: Difficulty obtaining subjective history secondary to pt's impaired speech (Parkinson's)     Hand Dominance        Extremity/Trunk Assessment   Upper Extremity Assessment Upper Extremity Assessment: Generalized weakness    Lower Extremity Assessment Lower Extremity Assessment: Generalized weakness    Cervical / Trunk  Assessment Cervical / Trunk Assessment: Kyphotic(maintaining flexed and protracted cervical position)  Communication   Communication: Expressive difficulties  Cognition Arousal/Alertness: Awake/alert Behavior During Therapy: Flat affect Overall Cognitive Status: Within Functional Limits for tasks assessed                                 General Comments: Pt alert and oriented to person, place, situation and time (with exception of answering only month and year correctly). Generally flat affect, some difficulty following commands though participates as able with repeated verbal and tactile cuing.      General Comments      Exercises     Assessment/Plan    PT Assessment Patient needs continued PT services  PT Problem List Decreased strength;Decreased mobility;Decreased safety awareness;Decreased range of motion;Decreased activity tolerance;Decreased balance;Pain       PT Treatment Interventions DME instruction;Therapeutic exercise;Wheelchair mobility training;Gait training;Balance training;Neuromuscular re-education;Functional mobility training;Therapeutic activities;Patient/family education    PT Goals (Current goals can be found in the Care Plan section)  Acute Rehab PT Goals Patient Stated Goal: to go home PT Goal Formulation: With patient Time For Goal Achievement: 01/14/19 Potential to Achieve Goals: Fair    Frequency Min 2X/week   Barriers to discharge        Co-evaluation               AM-PAC PT "6 Clicks" Mobility  Outcome Measure Help needed turning from your back to your side while in a flat bed without using bedrails?: A Lot Help needed moving from lying on your back to sitting on the side of a flat bed without using bedrails?: A Lot Help needed moving to and from a bed to a chair (including a wheelchair)?: Total Help needed standing up from a chair using your arms (e.g., wheelchair or bedside chair)?: Total Help needed to walk in hospital  room?: Total Help needed climbing 3-5 steps with a railing? : Total 6 Click Score: 8    End of Session   Activity Tolerance: Patient tolerated treatment well Patient left: in bed;with call bell/phone within reach;with bed alarm set   PT Visit Diagnosis: Unsteadiness on feet (R26.81);Muscle weakness (generalized) (M62.81);Difficulty in walking, not elsewhere classified (R26.2);Other symptoms and signs involving the nervous system (G53.646)    Time: 8032-1224 PT Time Calculation (min) (ACUTE ONLY): 27 min   Charges:   PT Evaluation $PT Eval Moderate Complexity: 1 Mod PT Treatments $Therapeutic Activity: 8-22 mins        Kendal Hymen, PT, DPT 12/31/18, 5:25 PM

## 2018-12-31 NOTE — Progress Notes (Signed)
Dr. Jannifer Franklin was notified of lactic level 4.5, down from 6.2, and requested additional lactic levels if needed. Subsequently, lactic level was ordered and drawn around MN, and value returned as 2.8. Dr. Jannifer Franklin also notified of this result; no further orders received.   Pt appears more alert this am. Oriented at this time.

## 2018-12-31 NOTE — Plan of Care (Signed)
  Problem: Education: Goal: Knowledge of General Education information will improve Description: Including pain rating scale, medication(s)/side effects and non-pharmacologic comfort measures Outcome: Progressing   Problem: Health Behavior/Discharge Planning: Goal: Ability to manage health-related needs will improve Outcome: Progressing   Problem: Clinical Measurements: Goal: Ability to maintain clinical measurements within normal limits will improve Outcome: Progressing Goal: Cardiovascular complication will be avoided Outcome: Progressing   Problem: Activity: Goal: Risk for activity intolerance will decrease Outcome: Progressing   Problem: Nutrition: Goal: Adequate nutrition will be maintained Outcome: Progressing   Problem: Elimination: Goal: Will not experience complications related to bowel motility Outcome: Progressing Goal: Will not experience complications related to urinary retention Outcome: Progressing   Problem: Clinical Measurements: Goal: Will remain free from infection Outcome: Not Progressing Note: Remains afebrile, antibiotics change to Unasyn today Goal: Diagnostic test results will improve Outcome: Not Progressing Note: Potassium 5, latic 1.9, BUN 57/1.89 Goal: Respiratory complications will improve Outcome: Not Progressing Note: Lungs diminished   Problem: Clinical Measurements: Goal: Will remain free from infection Outcome: Not Progressing Note: Remains afebrile, antibiotics change to Unasyn today Goal: Diagnostic test results will improve Outcome: Not Progressing Note: Potassium 5, latic 1.9, BUN 57/1.89 Goal: Respiratory complications will improve Outcome: Not Progressing Note: Lungs diminished

## 2018-12-31 NOTE — TOC Initial Note (Addendum)
Transition of Care [facility]) - Initial/Assessment Note    Patient Details  Name: Alan Peterson MRN: [id number] Date of Birth: Jan 28, 1941  Transition of Care [facility]) CM/SW Contact:    Sherren Kerns, RN Phone Number: 12/31/2018, 11:58 AM  Clinical Narrative:     Patient is from home alone.  Admitted with sepsis elevated lactic.  On IV antibiotics.  Current with PCP; [facility] [place] [facility].  Son lives next door and transports to appointments.  Uses a walker at home.  States he is open with [place].  Contacted Cory with [employer].  Will need PT evaluation to assess discharge disposition.  [facility] referral.  Followed by out patient palliative-[facility].                @1321 -spoke with son, .  Updated him on discharge planning and pending PT evaluation.  Expected Discharge Plan: Home w Home Health Services Barriers to Discharge: Continued Medical Work up   Patient Goals and CMS Choice Patient states their goals for this hospitalization and ongoing recovery are:: go home with home health CMS Medicare.gov Compare Post Acute Care list provided to:: Patient Choice offered to / list presented to : Patient  Expected Discharge Plan and Services Expected Discharge Plan: Home w Home Health Services   Discharge Planning Services: CM Consult   Living arrangements for the past 2 months: Single Family Home                           HH Arranged: RN, PT, OT, Nurse's Aide, Social Work [phone] Agency: [facility] Date [facility] Agency Contacted: 12/31/18 Time HH Agency Contacted: 1157 Representative spoke with at [facility] Agency: [facility]  Prior Living Arrangements/Services Living arrangements for the past 2 months: Single Family Home Lives with:: Self   Do you feel safe going back to the place where you live?: Yes            Criminal Activity/Legal Involvement Pertinent to Current Situation/Hospitalization: No - Comment as needed  Activities of Daily  Living Home Assistive Devices/Equipment: [phone] (specify type) ADL Screening (condition at time of admission) Patient's cognitive ability adequate to safely complete daily activities?: Yes Is the patient deaf or have difficulty hearing?: No Does the patient have difficulty seeing, even when wearing glasses/contacts?: No Does the patient have difficulty concentrating, remembering, or making decisions?: No Patient able to express need for assistance with ADLs?: Yes Does the patient have difficulty dressing or bathing?: Yes Independently performs ADLs?: No Communication: Independent Dressing (OT): Needs assistance Is this a change from baseline?: Change from baseline, expected to last >3 days Grooming: Needs assistance Is this a change from baseline?: Change from baseline, expected to last >3 days Feeding: Needs assistance Is this a change from baseline?: Change from baseline, expected to last >3 days Bathing: Needs assistance Is this a change from baseline?: Change from baseline, expected to last >3 days Toileting: Needs assistance Is this a change from baseline?: Change from baseline, expected to last >3days In/Out Bed: Needs assistance Is this a change from baseline?: Change from baseline, expected to last >3 days Walks in Home: Independent with device (comment)(walker) Does the patient have difficulty walking or climbing stairs?: Yes Weakness of Legs: Both Weakness of Arms/Hands: Both  Permission Sought/Granted   Permission granted to share information with : Yes, Verbal Permission Granted  Share Information with NAME: Environmental consultant  Emotional Assessment Appearance:: Appears stated age Attitude/Demeanor/Rapport: Gracious Affect (typically observed): Accepting Orientation: : Oriented to Self, Oriented to Place, Oriented to  Time, Oriented to Situation Alcohol / Substance Use: Not Applicable Psych Involvement: No (comment)  Admission diagnosis:  Chest pain, unspecified type  [R07.9] Sepsis without acute organ dysfunction, due to unspecified organism ([place]) [A41.9] Sepsis ([place]) [A41.9] Patient Active Problem List   Diagnosis Date Noted  . Sepsis ([place]) 12/29/2018  . Decubitus ulcer of buttock, stage 2 ([place]) 07/03/2018  . 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 Malnutrition of mild degree ([place]) 06/04/2017  . 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 Risk for falls 09/10/2015  . Candidal intertrigo 12/14/2014  . Advance directive discussed with patient 04/24/2014  . Gait difficulty 12-20-202015  . PN (peripheral neuropathy) 12-20-202015  . Routine general medical examination at a health care facility 08/29/2010  . Chronic venous insufficiency 12/24/2009  . Essential hypertension, benign 09/07/2009  . BPH with obstruction/lower urinary tract symptoms 06/14/2009  . HYPERLIPIDEMIA 08/30/2007  . Impaired fasting glucose 01/30/2007  . 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

## 2018-12-31 NOTE — Evaluation (Addendum)
Clinical/Bedside Swallow Evaluation Patient Details  Name: Alan Peterson MRN: [id number] Date of Birth: 25-Jul-1940  Today's Date: 12/31/2018 Time: SLP Start Time (ACUTE ONLY): 1200 SLP Stop Time (ACUTE ONLY): 1300 SLP Time Calculation (min) (ACUTE ONLY): 60 min  Past Medical History:  Past Medical History:  Diagnosis Date  . BPH (benign prostatic hypertrophy)   . Hyperlipidemia   . Parkinson's disease    Past Surgical History:  Past Surgical History:  Procedure Laterality Date  . FINGER CONTRACTURE RELEASE  1980   right, left 4th finger release 8/10   HPI:  Pt is a 78 y.o. male with a known history of Parkinson's Disease, BPH , hyperlipidemia presents to ER with generalized weakness and difficulty breathing. Patient also complaints of chest discomfort on deep breathing.. Uses a walker at home for ambulation. No recent travel and sick contacts. Covid 19 test negative; CXR revealed left lung atelectasis.  Pt stated he has had increasing difficulty eating/swallowing in the past few "days" though he endorsed "difficulty swallowing for years" as well. He has Not followed up w/ his Neurologist since 2018; No change in his parkinson's dis. Medications.   Assessment / Plan / Recommendation Clinical Impression  Pt appears to present w/ orophayrngeal phase dysphagia w/ increased risk for aspiration thus impact on Pulmonary status as well as overall risk for inadequate nutrition/hydration. Pt has a baseline of Parkinson's Dis which appears to be impacting his Motor movements as evidenced by oral holding, slow lingual movements, and slow A-P transfer of all bolus consistencies. Noted f/u swallows in attempts to clear remaining bolus residue. Pt exhibited no immediate, overt coughing/throat clearing or wet vocal quality w/ po trials of Nectar liquids and purees given -- no thin liquids were attempted secondary to pt's oral phase bolus management, and to pt's declined physical effort during volitional  cough(inadequate to protect airway). The ONE trial of semi-solid food had to be expectorated d/t pt's oral phase deficits of impaired mastication and A-P transfer.  Recommend modifying the oral diet at this time w/ f/u of toleration of diet, trials to upgrade consistencies as pt's status improves, Neurology to assess pt's medication for Parkinson's Dis. for any potential adjustment to aid motor functioning(give PRIOR TO MEALS?), and objective swallow assessment to more objectively assess pt's swallow function and safety w/ oral intake. MD updated, agreed.   SLP Visit Diagnosis: Dysphagia, oropharyngeal phase (R13.12)(Parkinson's Dis.)    Aspiration Risk  Moderate aspiration risk;Risk for inadequate nutrition/hydration    Diet Recommendation  Dysphagia level 1 (PUREE) w/ NECTAR consistency liquids; aspiration precautions and full feeding support -- check for oral clearing post trials.   Medication Administration: Crushed with puree(for safer, easier swallowing)    Other  Recommendations Recommended Consults: (Dietician f/u; Palliative Care consult for GOC) Oral Care Recommendations: Oral care BID;Oral care before and after PO;Staff/trained caregiver to provide oral care Other Recommendations: Order thickener from pharmacy;Prohibited food (jello, ice cream, thin soups);Remove water pitcher;Have oral suction available   Follow up Recommendations Skilled Nursing facility(TBD)      Frequency and Duration min 3x week  2 weeks       Prognosis Prognosis for Safe Diet Advancement: Guarded Barriers to Reach Goals: Time post onset;Severity of deficits(Parkinson's Dis.)      Swallow Study   General Date of Onset: 01/09/2019 HPI: Pt is a 78 y.o. male with a known history of Parkinson's Disease, BPH , hyperlipidemia presents to ER with generalized weakness and difficulty breathing. Patient also complaints of chest discomfort on  deep breathing.. Uses a walker at home for ambulation. No recent travel and  sick contacts. Covid 19 test negative; CXR revealed left lung atelectasis.  Pt stated he has had increasing difficulty eating/swallowing in the past few "days".  Type of Study: Bedside Swallow Evaluation Previous Swallow Assessment: none Diet Prior to this Study: Regular;Thin liquids Temperature Spikes Noted: No(wbc 6.9) Respiratory Status: Room air History of Recent Intubation: No Behavior/Cognition: Alert;Cooperative;Pleasant mood;Distractible;Requires cueing Oral Cavity Assessment: Excessive secretions Oral Care Completed by SLP: Yes Oral Cavity - Dentition: Missing dentition(few) Vision: Functional for self-feeding Self-Feeding Abilities: Total assist Patient Positioning: Upright in bed(needed full positioning) Baseline Vocal Quality: Low vocal intensity(mumbled speech) Volitional Cough: Weak;Congested Volitional Swallow: Unable to elicit    Oral/Motor/Sensory Function Overall Oral Motor/Sensory Function: Moderate impairment(open-mouth posture) Facial Strength: (reduced) Lingual ROM: (reduced) Lingual Strength: Reduced   Ice Chips Ice chips: Impaired Presentation: Spoon(fed; 3 trials) Oral Phase Impairments: Reduced lingual movement/coordination;Reduced labial seal Oral Phase Functional Implications: Prolonged oral transit;Oral holding(anterior spillage)   Thin Liquid Thin Liquid: Not tested    Nectar Thick Nectar Thick Liquid: Impaired Presentation: Spoon(fed; 10 trials) Oral Phase Impairments: Reduced labial seal;Reduced lingual movement/coordination Oral phase functional implications: Prolonged oral transit(anterior spillage) Pharyngeal Phase Impairments: Suspected delayed Swallow   Honey Thick Honey Thick Liquid: Not tested   Puree Puree: Impaired Presentation: Spoon(fed; 8 trials) Oral Phase Impairments: Reduced labial seal;Reduced lingual movement/coordination Oral Phase Functional Implications: Prolonged oral transit;Oral holding Pharyngeal Phase Impairments:  Suspected delayed Swallow   Solid     Solid: Impaired Presentation: Spoon(fed; 1 trial) Oral Phase Impairments: Reduced lingual movement/coordination;Impaired mastication;Reduced labial seal Oral Phase Functional Implications: Prolonged oral transit;Impaired mastication;Oral holding Pharyngeal Phase Impairments: (none) Other Comments: pt had to expectorate bolus       Orinda Kenner, MS, CCC-SLP , 12/31/2018,4:23 PM

## 2018-12-31 NOTE — Progress Notes (Signed)
Pt having difficulty swallowing/eating breakfast.  Speech evaluation ordered, spoke with Dr. Posey Pronto about this.

## 2018-12-31 NOTE — Progress Notes (Signed)
Please note, patient is currently followed by [facility] community Palliative at home. CMRN Geoffry Paradise made aware. Flo Shanks BSN, RN, [place] [phone]

## 2018-12-31 NOTE — Progress Notes (Signed)
[employer] [facility] [place] [facility]                                                                                                                                                                                  Patient Demographics   Alan Peterson, is a 78 y.o. male, DOB - 29-Mar-1940, WHQ:[id number]  Admit date - 01/03/2019   Admitting Physician Ihor Austin, MD  Outpatient Primary MD for the patient is Karie Schwalbe, MD   LOS - 1  Subjective: Patient states that he lives alone and has help coming.  He states " his Parkinson's gotten worse to the past few days".  He also endorses swallowing trouble.    Review of Systems:   CONSTITUTIONAL: No documented fever. No fatigue, weakness. No weight gain, no weight loss.  EYES: No blurry or double vision.  ENT: No tinnitus. No postnasal drip. No redness of the oropharynx.  RESPIRATORY: No cough, no wheeze, no hemoptysis. No dyspnea.  CARDIOVASCULAR: No chest pain. No orthopnea. No palpitations. No syncope.  GASTROINTESTINAL: No nausea, no vomiting or diarrhea. No abdominal pain. No melena or hematochezia.  GENITOURINARY: No dysuria or hematuria.  ENDOCRINE: No polyuria or nocturia. No heat or cold intolerance.  HEMATOLOGY: No anemia. No bruising. No bleeding.  INTEGUMENTARY: No rashes. No lesions.  MUSCULOSKELETAL: No arthritis. No swelling. No gout.  NEUROLOGIC: No numbness, tingling, or ataxia. No seizure-type activity.  PSYCHIATRIC: No anxiety. No insomnia. No ADD.    Vitals:   Vitals:   12/17/2018 1915 12/29/2018 2033 12/31/18 0457 12/31/18 0723  BP: 115/68 (!) 103/55 (!) 98/52 112/62  Pulse: (!) 53 79 79 69  Resp: (!) 22 (!) 28 (!) 24 18  Temp:  98.8 F (37.1 C) 98.9 F (37.2 C) 98.3 F (36.8 C)  TempSrc:  Oral Oral   SpO2: 96% 100% 97% 93%  Weight:  69.9 kg 70 kg   Height:  5\' 6"  (1.676 m)      Wt Readings from Last 3 Encounters:  12/31/18 70 kg  12/07/17 70.8 kg  06/04/17 73.5 kg      Intake/Output Summary (Last 24 hours) at 12/31/2018 1155 Last data filed at 12/31/2018 0800 Gross per 24 hour  Intake 853.9 ml  Output 0 ml  Net 853.9 ml    Physical Exam:   GENERAL: Pleasant-appearing in no apparent distress.  HEAD, EYES, EARS, NOSE AND THROAT: Atraumatic, normocephalic. Extraocular muscles are intact. Pupils equal and reactive to light. Sclerae anicteric. No conjunctival injection. No oro-pharyngeal erythema.  NECK: Supple. There is no jugular venous distention. No bruits, no lymphadenopathy, no thyromegaly.  HEART: Regular rate and rhythm,. No murmurs, no rubs, no  clicks.  LUNGS: Clear to auscultation bilaterally. No rales or rhonchi. No wheezes.  ABDOMEN: Soft, flat, nontender, nondistended. Has good bowel sounds. No hepatosplenomegaly appreciated.  EXTREMITIES: No evidence of any cyanosis, clubbing, or peripheral edema.  +2 pedal and radial pulses bilaterally.  NEUROLOGIC: Limited.  SKIN: Moist and warm with no rashes appreciated.  Psych: Not anxious, depressed LN: No inguinal LN enlargement    Antibiotics   Anti-infectives (From admission, onward)   Start     Dose/Rate Route Frequency Ordered Stop   12/31/18 1600  ceFEPIme (MAXIPIME) 2 g in sodium chloride 0.9 % 100 mL IVPB     2 g 200 mL/hr over 30 Minutes Intravenous Every 24 hours 2019/01/06 1858     2019/01/06 1849  vancomycin variable dose per unstable renal function (pharmacist dosing)      Does not apply See admin instructions Jan 06, 2019 1858     01-06-19 1800  vancomycin (VANCOCIN) 500 mg in sodium chloride 0.9 % 100 mL IVPB     500 mg 100 mL/hr over 60 Minutes Intravenous  Once 01/06/19 1628 01/06/19 1941   01/06/2019 1630  ceFEPIme (MAXIPIME) 2 g in sodium chloride 0.9 % 100 mL IVPB     2 g 200 mL/hr over 30 Minutes Intravenous  Once 01-06-19 1617 01/06/2019 1701   01-06-19 1630  vancomycin (VANCOCIN) IVPB 1000 mg/200 mL premix     1,000 mg 200 mL/hr over 60 Minutes Intravenous  Once 01/06/2019  1617 01-06-2019 1838      Medications   Scheduled Meds: . carbidopa-levodopa  1 tablet Oral TID  . docusate sodium  100 mg Oral BID  . heparin  5,000 Units Subcutaneous Q8H  . vancomycin variable dose per unstable renal function (pharmacist dosing)   Does not apply See admin instructions   Continuous Infusions: . sodium chloride 75 mL/hr at 12/31/18 0800  . ceFEPime (MAXIPIME) IV     PRN Meds:.acetaminophen **OR** acetaminophen, nitroGLYCERIN, ondansetron **OR** ondansetron (ZOFRAN) IV   Data Review:   Micro Results Recent Results (from the past 240 hour(s))  Blood Culture (routine x 2)     Status: None (Preliminary result)   Collection Time: 01-06-2019  4:18 PM   Specimen: BLOOD  Result Value Ref Range Status   Specimen Description BLOOD LEFT ANTECUBITAL  Final   Special Requests   Final    BOTTLES DRAWN AEROBIC AND ANAEROBIC Blood Culture adequate volume   Culture   Final    NO GROWTH < 12 HOURS Performed at [facility], [address] [place], [place] [address]    Report Status PENDING  Incomplete  Blood Culture (routine x 2)     Status: None (Preliminary result)   Collection Time: January 06, 2019  4:23 PM   Specimen: BLOOD  Result Value Ref Range Status   Specimen Description BLOOD BLOOD RIGHT FOREARM  Final   Special Requests   Final    BOTTLES DRAWN AEROBIC AND ANAEROBIC Blood Culture adequate volume   Culture   Final    NO GROWTH < 12 HOURS Performed at [facility], [address] [place], [place] [address]    Report Status PENDING  Incomplete  SARS CORONAVIRUS 2 (TAT 6-24 HRS) Nasopharyngeal Nasopharyngeal Swab     Status: None   Collection Time: 06-Jan-2019  4:45 PM   Specimen: Nasopharyngeal Swab  Result Value Ref Range Status   SARS Coronavirus 2 NEGATIVE NEGATIVE Final    Comment: (NOTE) SARS-CoV-2 target nucleic acids are NOT DETECTED. The SARS-CoV-2 RNA  is generally detectable in upper and lower respiratory specimens during the acute  phase of infection. Negative results do not preclude SARS-CoV-2 infection, do not rule out co-infections with other pathogens, and should not be used as the sole basis for treatment or other patient management decisions. Negative results must be combined with clinical observations, patient history, and epidemiological information. The expected result is Negative. Fact Sheet for Patients: HairSlick.no Fact Sheet for Healthcare Providers: quierodirigir.com This test is not yet approved or cleared by the [place] FDA and  has been authorized for detection and/or diagnosis of SARS-CoV-2 by FDA under an Emergency Use Authorization (EUA). This EUA will remain  in effect (meaning this test can be used) for the duration of the COVID-19 declaration under Section 56 4(b)(1) of the Act, 21 U.S.C. section 360bbb-3(b)(1), unless the authorization is terminated or revoked sooner. Performed at [facility], [address] [place], [place] [address]   MRSA PCR Screening     Status: None   Collection Time: 12/31/18  8:42 AM   Specimen: Nasal Mucosa; Nasopharyngeal  Result Value Ref Range Status   MRSA by PCR NEGATIVE NEGATIVE Final    Comment:        The GeneXpert MRSA Assay (FDA approved for NASAL specimens only), is one component of a comprehensive MRSA colonization surveillance program. It is not intended to diagnose MRSA infection nor to guide or monitor treatment for MRSA infections. Performed at [facility], [address] [place], [place] [address]     Radiology Reports Dg Chest Portable 1 View  Result Date: 12/31/2018 CLINICAL DATA:  Chest pain, shortness of breath. EXAM: PORTABLE CHEST 1 VIEW COMPARISON:  None. FINDINGS: The heart size and mediastinal contours are within normal limits. Right lung is clear. No pneumothorax is noted. Mild left basilar atelectasis is noted with probable small left pleural  effusion. The visualized skeletal structures are unremarkable. IMPRESSION: Mild left basilar subsegmental atelectasis with probable small left pleural effusion. Electronically Signed   By: Lupita Raider M.D.   On: 12/12/2018 15:52     CBC Recent Labs  Lab 01/01/2019 1517 12/31/18 0446  WBC 5.1 6.9  HGB 15.4 13.8  HCT 45.9 41.0  PLT 231 168  MCV 94.8 92.8  MCH 31.8 31.2  MCHC 33.6 33.7  RDW 12.9 13.1    Chemistries  Recent Labs  Lab 01/02/2019 1517 12/31/18 0446  NA 141 143  K 4.9 5.0  CL 108 114*  CO2 18* 17*  GLUCOSE 196* 102*  BUN 40* 57*  CREATININE 1.96* 1.89*  CALCIUM 9.5 8.3*   ------------------------------------------------------------------------------------------------------------------ estimated creatinine clearance is 28.1 mL/min (A) (by C-G formula based on SCr of 1.89 mg/dL (H)). ------------------------------------------------------------------------------------------------------------------ No results for input(s): HGBA1C in the last 72 hours. ------------------------------------------------------------------------------------------------------------------ No results for input(s): CHOL, HDL, LDLCALC, TRIG, CHOLHDL, LDLDIRECT in the last 72 hours. ------------------------------------------------------------------------------------------------------------------ No results for input(s): TSH, T4TOTAL, T3FREE, THYROIDAB in the last 72 hours.  Invalid input(s): FREET3 ------------------------------------------------------------------------------------------------------------------ No results for input(s): VITAMINB12, FOLATE, FERRITIN, TIBC, IRON, RETICCTPCT in the last 72 hours.  Coagulation profile No results for input(s): INR, PROTIME in the last 168 hours.  No results for input(s): DDIMER in the last 72 hours.  Cardiac Enzymes No results for input(s): CKMB, TROPONINI, MYOGLOBIN in the last 168 hours.  Invalid input(s):  CK ------------------------------------------------------------------------------------------------------------------ Invalid input(s): POCBNP    Assessment & Plan    78 yr old male patient with a known history of parkinsons disease, BPH , hyperlipidemia presents  to ER with generalized weakness and difficulty breathing. Patient also complaints of chest discomfort on deep breathing..  -Sepsis Has elevated lactate level covid 19 negative IV fluids Possible aspiration pneumonia Check procalcitonin level   -Left lung early pneumonia Broad spectrum abx  -Atypical chest pain reproducible in nature Could be from pneumonia Mildly elevated troponins could be from demand ischemia Cycle troponins Check echocardiogram I will try anti-inflammatory  -Dehydration IV fluids  -DVT prophylaxis with [place] heparin      Code Status Orders  (From admission, onward)         Start     Ordered   08-15-2018 2021  Full code  Continuous     08-15-2018 2020        Code Status History    This patient has a current code status but no historical code status.   Advance Care Planning Activity    Advance Directive Documentation     Most Recent Value  Type of Advance Directive  Living will  Pre-existing out of facility DNR order (yellow form or pink MOST form)  -  "MOST" Form in Place?  -           Consults none  DVT Prophylaxis  Lovenox   Lab Results  Component Value Date   PLT 168 12/31/2018     Time Spent in minutes 35 minutes  Greater than 50% of time spent in care coordination and counseling patient regarding the condition and plan of care.   Auburn BilberryShreyang Marckus Hanover M.D on 12/31/2018 at 11:55 AM  Between 7am to 6pm - Pager - [phone]  After 6pm go to [email or web address] - Social research officer, governmentpassword [email or web address]  [employer]   Office  [phone]

## 2019-01-01 ENCOUNTER — Other Ambulatory Visit: Payer: Self-pay | Admitting: *Deleted

## 2019-01-01 ENCOUNTER — Inpatient Hospital Stay: Payer: Medicare Other

## 2019-01-01 DIAGNOSIS — Z515 Encounter for palliative care: Secondary | ICD-10-CM

## 2019-01-01 DIAGNOSIS — J690 Pneumonitis due to inhalation of food and vomit: Secondary | ICD-10-CM

## 2019-01-01 DIAGNOSIS — R1310 Dysphagia, unspecified: Secondary | ICD-10-CM

## 2019-01-01 DIAGNOSIS — Z7189 Other specified counseling: Secondary | ICD-10-CM

## 2019-01-01 LAB — BASIC METABOLIC PANEL
Anion gap: 9 (ref 5–15)
BUN: 74 mg/dL — ABNORMAL HIGH (ref 8–23)
CO2: 20 mmol/L — ABNORMAL LOW (ref 22–32)
Calcium: 8 mg/dL — ABNORMAL LOW (ref 8.9–10.3)
Chloride: 116 mmol/L — ABNORMAL HIGH (ref 98–111)
Creatinine, Ser: 2.13 mg/dL — ABNORMAL HIGH (ref 0.61–1.24)
GFR calc Af Amer: 33 mL/min — ABNORMAL LOW (ref >60)
GFR calc non Af Amer: 28 mL/min — ABNORMAL LOW (ref >60)
Glucose, Bld: 118 mg/dL — ABNORMAL HIGH (ref 70–99)
Potassium: 4.2 mmol/L (ref 3.5–5.1)
Sodium: 145 mmol/L (ref 135–145)

## 2019-01-01 LAB — CBC
HCT: 40.5 % (ref 39.0–52.0)
Hemoglobin: 13.7 g/dL (ref 13.0–17.0)
MCH: 31.4 pg (ref 26.0–34.0)
MCHC: 33.8 g/dL (ref 30.0–36.0)
MCV: 92.9 fL (ref 80.0–100.0)
Platelets: 153 10*3/uL (ref 150–400)
RBC: 4.36 MIL/uL (ref 4.22–5.81)
RDW: 13.2 % (ref 11.5–15.5)
WBC: 6.6 10*3/uL (ref 4.0–10.5)
nRBC: 0 % (ref 0.0–0.2)

## 2019-01-01 MED ORDER — CARBIDOPA-LEVODOPA 25-100 MG PO TABS
1.0000 | ORAL_TABLET | Freq: Three times a day (TID) | ORAL | Status: DC
  Administered 2019-01-02 – 2019-01-04 (×6): 1 via ORAL
  Filled 2019-01-01 (×10): qty 1

## 2019-01-01 NOTE — Patient Outreach (Signed)
[facility]) Care Management  01/01/2019  BLEASE CAPALDI September 30, 1940 [id number]   Referral received: 12/31/2018 Initial Outreach: 01/01/2019 MD referral  Telephone Assessment-Unsuccessful  RN attempted outreach call today however unsuccessful. RN able to leave a HIPAA approved voice message requesting a call back.  Will attempted another outreach call over the next week. Will further engage at that time on possible needs.  Raina Mina, RN Care Management Coordinator [place] Office [phone]

## 2019-01-01 NOTE — [place] FL2 (Signed)
[place] MEDICAID FL2 LEVEL OF CARE SCREENING TOOL     IDENTIFICATION  Patient Name: Alan Peterson Birthdate: 04/11/1940 Sex: male Admission Date (Current Location): 01/01/2019  [place] and [place] Number:  Chiropodist and Address:  [facility], [address] [place], [place] [address]      Provider Number: [id number]  Attending Physician Name and Address:  Auburn Bilberry, MD  Relative Name and Phone Number:  [name]    Current Level of Care: Hospital Recommended Level of Care: Skilled Nursing Facility Prior Approval Number:    Date Approved/Denied:   PASRR Number: [id number]  Discharge Plan: SNF    Current Diagnoses: Patient Active Problem List   Diagnosis Date Noted  . Sepsis (HCC) 01/07/2019  . Decubitus ulcer of buttock, stage 2 (HCC) 07/03/2018  . 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 Malnutrition of mild degree (HCC) 06/04/2017  . 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 Risk for falls 09/10/2015  . Candidal intertrigo 12/14/2014  . Advance directive discussed with patient 04/24/2014  . Gait difficulty 01/07/2014  . PN (peripheral neuropathy) 01/07/2014  . Routine general medical examination at a health care facility 08/29/2010  . Chronic venous insufficiency 12/24/2009  . Essential hypertension, benign 09/07/2009  . BPH with obstruction/lower urinary tract symptoms 06/14/2009  . HYPERLIPIDEMIA 08/30/2007  . Impaired fasting glucose 01/30/2007  . 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    Orientation RESPIRATION BLADDER Height & Weight     Self, Time, Situation, Place  Normal Incontinent Weight: 71.6 kg Height:  5\' 6"  (167.6 cm)  BEHAVIORAL SYMPTOMS/MOOD NEUROLOGICAL BOWEL NUTRITION STATUS      Incontinent Diet(DYS 1; nectar thick)  AMBULATORY STATUS COMMUNICATION OF NEEDS Skin   Extensive Assist Verbally Normal                       Personal Care Assistance Level of Assistance  Bathing,  Feeding, Dressing Bathing Assistance: Limited assistance Feeding assistance: Independent Dressing Assistance: Limited assistance     Functional Limitations Info  Sight, Hearing, Speech Sight Info: Adequate Hearing Info: Adequate Speech Info: Impaired    SPECIAL CARE FACTORS FREQUENCY  PT (By licensed PT), OT (By licensed OT), Speech therapy     PT Frequency: 5 x a week OT Frequency: 5 x a week     Speech Therapy Frequency: 5 x a week      Contractures Contractures Info: Not present    Additional Factors Info  Code Status Code Status Info: Full             Current Medications (01/01/2019):  This is the current hospital active medication list Current Facility-Administered Medications  Medication Dose Route Frequency Provider Last Rate Last Dose  . 0.9 %  sodium chloride infusion   Intravenous Continuous 01/03/2019, MD 75 mL/hr at 12/31/18 2226    . acetaminophen (TYLENOL) tablet 650 mg  650 mg Oral Q6H PRN 2227, MD       Or  . acetaminophen (TYLENOL) suppository 650 mg  650 mg Rectal Q6H PRN Pyreddy, Pavan, MD      . Ampicillin-Sulbactam (UNASYN) 3 g in sodium chloride 0.9 % 100 mL IVPB  3 g Intravenous Q12H Ihor Austin, MD 200 mL/hr at 01/01/19 0200 3 g at 01/01/19 0200  . carbidopa-levodopa (SINEMET IR) 25-100 MG per tablet immediate release 1 tablet  1 tablet Oral TID 01/03/19, MD      . docusate sodium (COLACE) capsule 100 mg  100 mg Oral BID Saundra Shelling, MD   100 mg at 12/13/2018 2201  . heparin injection 5,000 Units  5,000 Units Subcutaneous Q8H Pyreddy, Reatha Harps, MD   5,000 Units at 01/01/19 0517  . hydrocerin (EUCERIN) cream   Topical BID Dustin Flock, MD      . nitroGLYCERIN (NITROSTAT) SL tablet 0.4 mg  0.4 mg Sublingual Q5 min PRN Pyreddy, Reatha Harps, MD      . ondansetron (ZOFRAN) tablet 4 mg  4 mg Oral Q6H PRN Pyreddy, Reatha Harps, MD       Or  . ondansetron (ZOFRAN) injection 4 mg  4 mg Intravenous Q6H PRN Saundra Shelling, MD          Discharge Medications: Please see discharge summary for a list of discharge medications.  Relevant Imaging Results:  Relevant Lab Results:   Additional Information [id number]  Elza Rafter, RN

## 2019-01-01 NOTE — Consult Note (Signed)
Reason for Consult:Difficulty swallowing Referring Physician: Posey Pronto  CC: Difficulty swallowing  HPI: Alan Peterson is an 78 y.o. male with a history of PD admitted on 10/19 with complaints of weakness and SOB.  Patient evaluated by Speech therapy on yesterday and felt to have swallowing difficulties with a neurologic basis.  Consult called at that time.     Last note I see for neurology was in 2018 when patient followed by Dr. Sabra Heck.  It does not appear that Sinemet has been changed since that time.  Patient reports that he has had swallowing difficulties for years.    Past Medical History:  Diagnosis Date  . BPH (benign prostatic hypertrophy)   . Hyperlipidemia   . Parkinson's disease     Past Surgical History:  Procedure Laterality Date  . FINGER CONTRACTURE RELEASE  1980   right, left 4th finger release 8/10    Family History  Problem Relation Age of Onset  . Hypertension Mother   . Coronary artery disease Neg Hx   . Diabetes Neg Hx   . Cancer Neg Hx     Social History:  reports that he has never smoked. He has never used smokeless tobacco. He reports previous alcohol use. He reports previous drug use.  No Known Allergies  Medications:  I have reviewed the patient's current medications. Prior to Admission:  Medications Prior to Admission  Medication Sig Dispense Refill Last Dose  . carbidopa-levodopa (SINEMET IR) 25-100 MG tablet TAKE 1 TABLET BY MOUTH THREE TIMES A DAY (Patient taking differently: Take 1 tablet by mouth 3 (three) times daily. ) 270 tablet 3 12/28/2018 at 0700  . silver sulfADIAZINE (SILVADENE) 1 % cream APPLY TO AFFECTED AREA EVERY DAY (Patient taking differently: Apply 1 application topically daily as needed (skin irritations). ) 50 g 1 Unknown at PRN  . cephALEXin (KEFLEX) 500 MG capsule Take 1 capsule (500 mg total) by mouth 3 (three) times daily. (Patient not taking: Reported on 01/03/2019) 15 capsule 1 Not Taking at Unknown time   Scheduled: .  carbidopa-levodopa  1 tablet Oral TID  . docusate sodium  100 mg Oral BID  . heparin  5,000 Units Subcutaneous Q8H  . hydrocerin   Topical BID    ROS: History obtained from the patient  General ROS: negative for - chills, fatigue, fever, night sweats, weight gain or weight loss Psychological ROS: negative for - behavioral disorder, hallucinations, memory difficulties, mood swings or suicidal ideation Ophthalmic ROS: negative for - blurry vision, double vision, eye pain or loss of vision ENT ROS: as noted in HPI Allergy and Immunology ROS: negative for - hives or itchy/watery eyes Hematological and Lymphatic ROS: negative for - bleeding problems, bruising or swollen lymph nodes Endocrine ROS: negative for - galactorrhea, hair pattern changes, polydipsia/polyuria or temperature intolerance Respiratory ROS: as noted in HPI Cardiovascular ROS: negative for - chest pain, dyspnea on exertion, edema or irregular heartbeat Gastrointestinal ROS: negative for - abdominal pain, diarrhea, hematemesis, nausea/vomiting or stool incontinence Genito-Urinary ROS: negative for - dysuria, hematuria, incontinence or urinary frequency/urgency Musculoskeletal ROS: negative for - joint swelling or muscular weakness Neurological ROS: as noted in HPI, difficulty with gait Dermatological ROS: negative for rash and skin lesion changes  Physical Examination: Blood pressure (!) 126/59, pulse 68, temperature 98.5 F (36.9 C), temperature source Oral, resp. rate 16, height 5\' 6"  (1.676 m), weight 71.6 kg, SpO2 92 %.  HEENT-  Normocephalic, no lesions, without obvious abnormality.  Normal external eye and conjunctiva.  Normal TM's bilaterally.  Normal auditory canals and external ears. Normal external nose, mucus membranes and septum.  Normal pharynx. Cardiovascular- S1, S2 normal, pulses palpable throughout   Lungs- chest clear, no wheezing, rales, normal symmetric air entry Abdomen- soft, non-tender; bowel sounds  normal; no masses,  no organomegaly Extremities- no edema Lymph-no adenopathy palpable Musculoskeletal-no joint tenderness, deformity or swelling Skin-warm and dry, no hyperpigmentation, vitiligo, or suspicious lesions  Neurological Examination   Mental Status: Alert.  Speech dysarthric and minimal.  Masked facies.  Follows commands.   Cranial Nerves: II: Visual fields grossly normal III,IV, VI: ptosis not present, decreased left eye lateral gaze V,VII: left facial droop, facial light touch sensation normal bilaterally VIII: hearing normal bilaterally IX,X: gag reflex present XI: bilateral shoulder shrug XII: midline tongue extension Motor: Increased tone throughout.  Moves all extremities against gravity but weakly, throughout Sensory: Pinprick and light touch intact throughout, bilaterally Deep Tendon Reflexes: Symmetric throughout Plantars: Right: mute   Left: mute Cerebellar: Unable to perform Gait: not tested due to safety concerns    Laboratory Studies:   Basic Metabolic Panel: Recent Labs  Lab 01/01/2019 1517 12/31/18 0446 01/01/19 0531  NA 141 143 145  K 4.9 5.0 4.2  CL 108 114* 116*  CO2 18* 17* 20*  GLUCOSE 196* 102* 118*  BUN 40* 57* 74*  CREATININE 1.96* 1.89* 2.13*  CALCIUM 9.5 8.3* 8.0*    Liver Function Tests: No results for input(s): AST, ALT, ALKPHOS, BILITOT, PROT, ALBUMIN in the last 168 hours. No results for input(s): LIPASE, AMYLASE in the last 168 hours. No results for input(s): AMMONIA in the last 168 hours.  CBC: Recent Labs  Lab 12/27/2018 1517 12/31/18 0446 01/01/19 0531  WBC 5.1 6.9 6.6  HGB 15.4 13.8 13.7  HCT 45.9 41.0 40.5  MCV 94.8 92.8 92.9  PLT 231 168 153    Cardiac Enzymes: No results for input(s): CKTOTAL, CKMB, CKMBINDEX, TROPONINI in the last 168 hours.  BNP: Invalid input(s): POCBNP  CBG: No results for input(s): GLUCAP in the last 168 hours.  Microbiology: Results for orders placed or performed during the  hospital encounter of 12/20/2018  Blood Culture (routine x 2)     Status: None (Preliminary result)   Collection Time: 12/29/2018  4:18 PM   Specimen: BLOOD  Result Value Ref Range Status   Specimen Description BLOOD LEFT ANTECUBITAL  Final   Special Requests   Final    BOTTLES DRAWN AEROBIC AND ANAEROBIC Blood Culture adequate volume   Culture   Final    NO GROWTH 2 DAYS Performed at [facility], [address], [place], [place] [address]    Report Status PENDING  Incomplete  Blood Culture (routine x 2)     Status: None (Preliminary result)   Collection Time: 12/23/2018  4:23 PM   Specimen: BLOOD  Result Value Ref Range Status   Specimen Description BLOOD BLOOD RIGHT FOREARM  Final   Special Requests   Final    BOTTLES DRAWN AEROBIC AND ANAEROBIC Blood Culture adequate volume   Culture   Final    NO GROWTH 2 DAYS Performed at [facility], [address], [place], [place] [address]    Report Status PENDING  Incomplete  SARS CORONAVIRUS 2 (TAT 6-24 HRS) Nasopharyngeal Nasopharyngeal Swab     Status: None   Collection Time: 12/20/2018  4:45 PM   Specimen: Nasopharyngeal Swab  Result Value Ref Range Status   SARS Coronavirus 2 NEGATIVE NEGATIVE Final  Comment: (NOTE) SARS-CoV-2 target nucleic acids are NOT DETECTED. The SARS-CoV-2 RNA is generally detectable in upper and lower respiratory specimens during the acute phase of infection. Negative results do not preclude SARS-CoV-2 infection, do not rule out co-infections with other pathogens, and should not be used as the sole basis for treatment or other patient management decisions. Negative results must be combined with clinical observations, patient history, and epidemiological information. The expected result is Negative. Fact Sheet for Patients: HairSlick.nohttps://www.fda.gov/media/138098/download Fact Sheet for Healthcare Providers: quierodirigir.comhttps://www.fda.gov/media/138095/download This test is not yet approved or cleared by  the [place] FDA and  has been authorized for detection and/or diagnosis of SARS-CoV-2 by FDA under an Emergency Use Authorization (EUA). This EUA will remain  in effect (meaning this test can be used) for the duration of the COVID-19 declaration under Section 56 4(b)(1) of the Act, 21 U.S.C. section 360bbb-3(b)(1), unless the authorization is terminated or revoked sooner. Performed at [facility], [address] [place], [place] [address]   MRSA PCR Screening     Status: None   Collection Time: 12/31/18  8:42 AM   Specimen: Nasal Mucosa; Nasopharyngeal  Result Value Ref Range Status   MRSA by PCR NEGATIVE NEGATIVE Final    Comment:        The GeneXpert MRSA Assay (FDA approved for NASAL specimens only), is one component of a comprehensive MRSA colonization surveillance program. It is not intended to diagnose MRSA infection nor to guide or monitor treatment for MRSA infections. Performed at [facility], [address] [place], [place] [address]     Coagulation Studies: No results for input(s): LABPROT, INR in the last 72 hours.  Urinalysis:  Recent Labs  Lab 09-12-18 1531  COLORURINE AMBER*  LABSPEC 1.023  PHURINE 5.0  GLUCOSEU NEGATIVE  HGBUR NEGATIVE  BILIRUBINUR NEGATIVE  KETONESUR 5*  PROTEINUR 100*  NITRITE NEGATIVE  LEUKOCYTESUR SMALL*    Lipid Panel:     Component Value Date/Time   CHOL 175 04/24/2014 1128   TRIG 69.0 04/24/2014 1128   HDL 44.90 04/24/2014 1128   CHOLHDL 4 04/24/2014 1128   VLDL 13.8 04/24/2014 1128   LDLCALC 116 (H) 04/24/2014 1128    HgbA1C:  Lab Results  Component Value Date   HGBA1C 5.7 05/29/2016    Urine Drug Screen:  No results found for: LABOPIA, COCAINSCRNUR, LABBENZ, AMPHETMU, THCU, LABBARB  Alcohol Level: No results for input(s): ETH in the last 168 hours.  Other results: EKG: sinus tachycardia at 100 bpm, RBBB  Imaging: Dg Chest Portable 1 View  Result Date: 09-12-2018 CLINICAL  DATA:  Chest pain, shortness of breath. EXAM: PORTABLE CHEST 1 VIEW COMPARISON:  None. FINDINGS: The heart size and mediastinal contours are within normal limits. Right lung is clear. No pneumothorax is noted. Mild left basilar atelectasis is noted with probable small left pleural effusion. The visualized skeletal structures are unremarkable. IMPRESSION: Mild left basilar subsegmental atelectasis with probable small left pleural effusion. Electronically Signed   By: Lupita RaiderJames  Green Jr M.D.   On: 09-12-2018 15:52     Assessment/Plan: 78 year old male admitted with weakness and SOB.  Patient with swallowing difficulties as well that he reports are long term but currently appear to be interfering with him eating safely.  Patient evaluated today prior to his morning Sinemet and from review of the records, dosing of his Sinemet has been a challenge.  Patient also with some mild focality on neurological examination.  Will rule out a brain  stem infarct as etiology.  Recommendations: 1. MRI of the brain without contrast 2. Before attempting to increase the Sinemet would evaluate his response to a change in dosing.  If not adequate will increase at that time to 2 tablets TID-current times.     Thana Farr, MD Neurology [phone] 01/01/2019, 10:27 AM

## 2019-01-01 NOTE — Progress Notes (Signed)
Notify Dr. Posey Pronto about patient's high risk of aspiration. Patient was pocketing his food, unable to swallow even when coaching him to swallow his food and fluids. Asked if patient can be NPO for now, order given.

## 2019-01-01 NOTE — Progress Notes (Signed)
Dr. Marcille Blanco in the unit, let him know about MRI unable to do the imaging.

## 2019-01-01 NOTE — Progress Notes (Signed)
[employer] - [place] at [facility]                                                                                                                                                                                  Patient Demographics   Alan Peterson, is a 78 y.o. male, DOB - [birth year], UDJ:[id number]  Admit date - 01/07/2019   Admitting Physician Ihor Austin, MD  Outpatient Primary MD for the patient is Karie Schwalbe, MD   LOS - 2  Subjective: At the time of my evaluation patient has drooping of face    Review of Systems:   CONSTITUTIONAL: No documented fever. No fatigue, weakness. No weight gain, no weight loss.  EYES: No blurry or double vision.  ENT: No tinnitus. No postnasal drip. No redness of the oropharynx.  RESPIRATORY: No cough, no wheeze, no hemoptysis. No dyspnea.  CARDIOVASCULAR: No chest pain. No orthopnea. No palpitations. No syncope.  GASTROINTESTINAL: No nausea, no vomiting or diarrhea. No abdominal pain. No melena or hematochezia.  GENITOURINARY: No dysuria or hematuria.  ENDOCRINE: No polyuria or nocturia. No heat or cold intolerance.  HEMATOLOGY: No anemia. No bruising. No bleeding.  INTEGUMENTARY: No rashes. No lesions.  MUSCULOSKELETAL: No arthritis. No swelling. No gout.  NEUROLOGIC: No numbness, tingling, or ataxia. No seizure-type activity.  PSYCHIATRIC: No anxiety. No insomnia. No ADD.    Vitals:   Vitals:   12/31/18 1652 12/31/18 1954 01/01/19 0452 01/01/19 0747  BP:  132/65 130/63 (!) 126/59  Pulse: 83 75 84 68  Resp:  (!) 28 (!) 28 16  Temp:  98.5 F (36.9 C) 98.5 F (36.9 C) 98.5 F (36.9 C)  TempSrc:  Oral Oral Oral  SpO2: 97% 95% 97% 92%  Weight:   71.6 kg   Height:        Wt Readings from Last 3 Encounters:  01/01/19 71.6 kg  12/07/17 70.8 kg  06/04/17 73.5 kg     Intake/Output Summary (Last 24 hours) at 01/01/2019 1251 Last data filed at 01/01/2019 0700 Gross per 24 hour  Intake 1547.87 ml  Output 75  ml  Net 1472.87 ml    Physical Exam:   GENERAL: Pleasant-appearing in no apparent distress.  HEAD, EYES, EARS, NOSE AND THROAT: Atraumatic, normocephalic. Extraocular muscles are intact. Pupils equal and reactive to light. Sclerae anicteric. No conjunctival injection. No oro-pharyngeal erythema.  NECK: Supple. There is no jugular venous distention. No bruits, no lymphadenopathy, no thyromegaly.  HEART: Regular rate and rhythm,. No murmurs, no rubs, no clicks.  LUNGS: Clear to auscultation bilaterally. No rales or rhonchi. No wheezes.  ABDOMEN: Soft, flat, nontender, nondistended. Has good bowel sounds. No hepatosplenomegaly  appreciated.  EXTREMITIES: No evidence of any cyanosis, clubbing, or peripheral edema.  +2 pedal and radial pulses bilaterally.  NEUROLOGIC: Limited.  SKIN: Moist and warm with no rashes appreciated.  Psych: Not anxious, depressed LN: No inguinal LN enlargement    Antibiotics   Anti-infectives (From admission, onward)   Start     Dose/Rate Route Frequency Ordered Stop   12/31/18 1600  ceFEPIme (MAXIPIME) 2 g in sodium chloride 0.9 % 100 mL IVPB  Status:  Discontinued     2 g 200 mL/hr over 30 Minutes Intravenous Every 24 hours 2019-01-12 1858 12/31/18 1158   12/31/18 1207  Ampicillin-Sulbactam (UNASYN) 3 g in sodium chloride 0.9 % 100 mL IVPB     3 g 200 mL/hr over 30 Minutes Intravenous Every 12 hours 12/31/18 1207     12/31/18 1200  Ampicillin-Sulbactam (UNASYN) 3 g in sodium chloride 0.9 % 100 mL IVPB  Status:  Discontinued     3 g 200 mL/hr over 30 Minutes Intravenous Every 6 hours 12/31/18 1158 12/31/18 1207   01/12/2019 1849  vancomycin variable dose per unstable renal function (pharmacist dosing)  Status:  Discontinued      Does not apply See admin instructions 01/12/2019 1858 12/31/18 1158   2019/01/12 1800  vancomycin (VANCOCIN) 500 mg in sodium chloride 0.9 % 100 mL IVPB     500 mg 100 mL/hr over 60 Minutes Intravenous  Once Jan 12, 2019 1628 January 12, 2019 1941    01-12-19 1630  ceFEPIme (MAXIPIME) 2 g in sodium chloride 0.9 % 100 mL IVPB     2 g 200 mL/hr over 30 Minutes Intravenous  Once Jan 12, 2019 1617 Jan 12, 2019 1701   01-12-2019 1630  vancomycin (VANCOCIN) IVPB 1000 mg/200 mL premix     1,000 mg 200 mL/hr over 60 Minutes Intravenous  Once 2019/01/12 1617 01/12/2019 1838      Medications   Scheduled Meds: . carbidopa-levodopa  1 tablet Oral TID  . docusate sodium  100 mg Oral BID  . heparin  5,000 Units Subcutaneous Q8H  . hydrocerin   Topical BID   Continuous Infusions: . sodium chloride 75 mL/hr at 01/01/19 1245  . ampicillin-sulbactam (UNASYN) IV 3 g (01/01/19 1249)   PRN Meds:.acetaminophen **OR** acetaminophen, nitroGLYCERIN, ondansetron **OR** ondansetron (ZOFRAN) IV   Data Review:   Micro Results Recent Results (from the past 240 hour(s))  Blood Culture (routine x 2)     Status: None (Preliminary result)   Collection Time: 2019/01/12  4:18 PM   Specimen: BLOOD  Result Value Ref Range Status   Specimen Description BLOOD LEFT ANTECUBITAL  Final   Special Requests   Final    BOTTLES DRAWN AEROBIC AND ANAEROBIC Blood Culture adequate volume   Culture   Final    NO GROWTH 2 DAYS Performed at [facility], [address] [place], [place] [address]    Report Status PENDING  Incomplete  Blood Culture (routine x 2)     Status: None (Preliminary result)   Collection Time: 01-12-19  4:23 PM   Specimen: BLOOD  Result Value Ref Range Status   Specimen Description BLOOD BLOOD RIGHT FOREARM  Final   Special Requests   Final    BOTTLES DRAWN AEROBIC AND ANAEROBIC Blood Culture adequate volume   Culture   Final    NO GROWTH 2 DAYS Performed at [facility], [address] [place], [place] [address]    Report Status PENDING  Incomplete  SARS CORONAVIRUS 2 (TAT 6-24 HRS) Nasopharyngeal Nasopharyngeal Swab  Status: None   Collection Time: 07-01-2018  4:45 PM   Specimen: Nasopharyngeal Swab  Result Value Ref Range Status    SARS Coronavirus 2 NEGATIVE NEGATIVE Final    Comment: (NOTE) SARS-CoV-2 target nucleic acids are NOT DETECTED. The SARS-CoV-2 RNA is generally detectable in upper and lower respiratory specimens during the acute phase of infection. Negative results do not preclude SARS-CoV-2 infection, do not rule out co-infections with other pathogens, and should not be used as the sole basis for treatment or other patient management decisions. Negative results must be combined with clinical observations, patient history, and epidemiological information. The expected result is Negative. Fact Sheet for Patients: HairSlick.nohttps://www.fda.gov/media/138098/download Fact Sheet for Healthcare Providers: quierodirigir.comhttps://www.fda.gov/media/138095/download This test is not yet approved or cleared by the [place] FDA and  has been authorized for detection and/or diagnosis of SARS-CoV-2 by FDA under an Emergency Use Authorization (EUA). This EUA will remain  in effect (meaning this test can be used) for the duration of the COVID-19 declaration under Section 56 4(b)(1) of the Act, 21 U.S.C. section 360bbb-3(b)(1), unless the authorization is terminated or revoked sooner. Performed at [facility], [address] [place], [place] [address]   MRSA PCR Screening     Status: None   Collection Time: 12/31/18  8:42 AM   Specimen: Nasal Mucosa; Nasopharyngeal  Result Value Ref Range Status   MRSA by PCR NEGATIVE NEGATIVE Final    Comment:        The GeneXpert MRSA Assay (FDA approved for NASAL specimens only), is one component of a comprehensive MRSA colonization surveillance program. It is not intended to diagnose MRSA infection nor to guide or monitor treatment for MRSA infections. Performed at [facility], [address] [place], [place] [address]     Radiology Reports Dg Chest Portable 1 View  Result Date: 04-20-202020 CLINICAL DATA:  Chest pain, shortness of breath. EXAM: PORTABLE CHEST 1  VIEW COMPARISON:  None. FINDINGS: The heart size and mediastinal contours are within normal limits. Right lung is clear. No pneumothorax is noted. Mild left basilar atelectasis is noted with probable small left pleural effusion. The visualized skeletal structures are unremarkable. IMPRESSION: Mild left basilar subsegmental atelectasis with probable small left pleural effusion. Electronically Signed   By: Lupita RaiderJames  Green Jr M.D.   On: 04-20-202020 15:52     CBC Recent Labs  Lab 07-01-2018 1517 12/31/18 0446 01/01/19 0531  WBC 5.1 6.9 6.6  HGB 15.4 13.8 13.7  HCT 45.9 41.0 40.5  PLT 231 168 153  MCV 94.8 92.8 92.9  MCH 31.8 31.2 31.4  MCHC 33.6 33.7 33.8  RDW 12.9 13.1 13.2    Chemistries  Recent Labs  Lab 07-01-2018 1517 12/31/18 0446 01/01/19 0531  NA 141 143 145  K 4.9 5.0 4.2  CL 108 114* 116*  CO2 18* 17* 20*  GLUCOSE 196* 102* 118*  BUN 40* 57* 74*  CREATININE 1.96* 1.89* 2.13*  CALCIUM 9.5 8.3* 8.0*   ------------------------------------------------------------------------------------------------------------------ estimated creatinine clearance is 25 mL/min (A) (by C-G formula based on SCr of 2.13 mg/dL (H)). ------------------------------------------------------------------------------------------------------------------ No results for input(s): HGBA1C in the last 72 hours. ------------------------------------------------------------------------------------------------------------------ No results for input(s): CHOL, HDL, LDLCALC, TRIG, CHOLHDL, LDLDIRECT in the last 72 hours. ------------------------------------------------------------------------------------------------------------------ No results for input(s): TSH, T4TOTAL, T3FREE, THYROIDAB in the last 72 hours.  Invalid input(s): FREET3 ------------------------------------------------------------------------------------------------------------------ No results for input(s): VITAMINB12, FOLATE, FERRITIN, TIBC, IRON,  RETICCTPCT in the last 72 hours.  Coagulation profile No results for input(s): INR, PROTIME  in the last 168 hours.  No results for input(s): DDIMER in the last 72 hours.  Cardiac Enzymes No results for input(s): CKMB, TROPONINI, MYOGLOBIN in the last 168 hours.  Invalid input(s): CK ------------------------------------------------------------------------------------------------------------------ Invalid input(s): POCBNP    Assessment & Plan    78 yr old male patient with a known history of parkinsons disease, BPH , hyperlipidemia presents to ER with generalized weakness and difficulty breathing. Patient also complaints of chest discomfort on deep breathing..  -Sepsis Has elevated lactate level covid 19 negative IV fluids Possible aspiration pneumonia Continue antibiotics procalcitonin level levels consistent with pneumonia   -Left lung early pneumonia Continue Unasyn  -Atypical chest pain reproducible in nature Could be from pneumonia Mildly elevated troponins from demand ischemia no further chest pain   -Dehydration IV fluids  -Parkinson's with worsening has been seen by neurology  -DVT prophylaxis with [place] heparin      Code Status Orders  (From admission, onward)         Start     Ordered   January 12, 2019 2021  Full code  Continuous     01/12/2019 2020        Code Status History    This patient has a current code status but no historical code status.   Advance Care Planning Activity    Advance Directive Documentation     Most Recent Value  Type of Advance Directive  Living will  Pre-existing out of facility DNR order (yellow form or pink MOST form)  -  "MOST" Form in Place?  -           Consults none  DVT Prophylaxis  Lovenox   Lab Results  Component Value Date   PLT 153 01/01/2019     Time Spent in minutes 35 minutes  Greater than 50% of time spent in care coordination and counseling patient regarding the condition and plan of  care.   Dustin Flock M.D on 01/01/2019 at 12:51 PM  Between 7am to 6pm - Pager - [phone]  After 6pm go to [email or web address] - Proofreader  [employer]   Office  [phone]

## 2019-01-01 NOTE — Progress Notes (Signed)
PT Cancellation Note  Patient Details Name: Alan Peterson MRN: [id number] DOB: [birth year]   Cancelled Treatment:    Reason Eval/Treat Not Completed: Other (comment). Checked with pt and nursing, as chart review notes pt ordered MRI to rule out brain stem infarct. (continues with facial droop, heavy drool from R face upon observation) Pt awaiting assist with meal and is hungry. Re attempt at a later time, once imaging complete with results.    Larae Grooms, PTA 01/01/2019, 3:39 PM

## 2019-01-01 NOTE — Progress Notes (Signed)
Attempted to call son, but not answered my call.

## 2019-01-01 NOTE — Consult Note (Signed)
Consultation Note Date: 01/01/2019   Patient Name: Alan Peterson  DOB: [birth year]  MRN: [id number]  Age / Sex: 78 y.o., male  PCP: [name], MD Referring Physician: Dustin Flock, MD  Reason for Consultation: Establishing goals of care  HPI/Patient Profile: 78 y.o. male  with past medical history of Parkinson's disease, BPH, HLD admitted on 12/29/2018 with SOB, chest pain, weakness with possible aspiration pneumonia.   Clinical Assessment and Goals of Care: I met today with Alan Peterson. He is lying in bed with eyes closed. Has drooling of thick yellow/light brown sputum from right side of mouth. He does speak but mainly yes/no questions but able to converse. Speech is mostly understandable. I explained that the plan is for MRI and adjustment of his medications to see if he can get any improvement in his symptoms and decline. Upon any questions or concerns he directs me to speak with his son, Alan Peterson. However, I did ask him about his main concern and he does tell me that he was worried that he is "nearing the end." I reassured him that his medical team is working to support him but also acknowledged that his health is declining and Parkinson's is expected to progress and make things more difficult. I explained that I believe the important piece to consider is if he is nearing the end how we can help him to live as good as possible during this time and supporting him in his goals and priorities. When I mention discussion of goals of care and decisions he again directs me to [place].   I called and spoke with Alan Peterson. I explained to him the plan for MRI brain r/t stroke or other etiology that could lead to worsening. [name] confirms that Alan Peterson has not seen neurologist since 2018 and has remained mostly stable but with gradual decline over the years. [name] tells me that his father seemed to be doing well right up  until time of admission. [name] reports good days (when his father was able to get around and fix himself all three meals) and bad days when he was able to do very little. We discussed progression of Parkinson's disease and dysphagia. They have never discussed artificial feeding. Alan Peterson is aware of the expected progression.   However, [name]'s main concern is their ability to properly care for [name]. 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 Ideally they would have resources to to help them to keep him at home would be their desire but their resources are spreading thin. This is a difficult situation and no easy solutions.   All questions/concerns addressed. Emotional support provided. I will reach out to Alan Peterson again tomorrow.   Primary Decision Maker PATIENT and son, Alan Peterson    SUMMARY OF RECOMMENDATIONS   - Will continue [place] conversation tomorrow - Son to speak  with patient about considering rehab - Consider referral to [facility] for potential resources ([facility]? But they do not have Medicaid).   Code Status/Advance Care Planning:  Full code - will need further discussion   Symptom Management:   Per neurology and primary  Palliative Prophylaxis:   Aspiration, Bowel Regimen, Delirium Protocol, Frequent Pain Assessment, Oral Care and Turn Reposition  Additional Recommendations (Limitations, Scope, Preferences):  Full Scope Treatment  Psycho-social/Spiritual:   Desire for further Chaplaincy support:no  Additional Recommendations: Caregiving  Support/Resources and Education on Hospice  Prognosis:   Overall prognosis poor with progressing Parkinson's disease and worsening dysphagia.   Discharge Planning: To Be Determined      Primary Diagnoses: Present on Admission: . Sepsis ([place])    I have reviewed the medical record, interviewed the patient and family, and examined the patient. The following aspects are pertinent.  Past Medical History:  Diagnosis Date  . BPH (benign prostatic hypertrophy)   . Hyperlipidemia   . Parkinson's disease    Social History   Socioeconomic History  . Marital status: Divorced    Spouse name: Not on file  . Number of children: 2  . Years of education: Not on file  . Highest education level: Not on file  Occupational History  . 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 Financial resource strain: Not on file  . Food insecurity    Worry: Not on file    Inability: Not on file  . Transportation needs    Medical: Not on file    Non-medical: Not on file  Tobacco Use  . Smoking status: Never Smoker  . Smokeless tobacco: Never Used  Substance and Sexual Activity  . Alcohol use: Not Currently    Comment: beer  . Drug use: Not Currently  . Sexual activity: Not on file  Lifestyle  . Physical activity    Days per week: Not on file    Minutes per session: Not on file  . Stress: Not on file  Relationships  . Social Herbalist on phone: Not on file    Gets together: Not on file    Attends religious service: Not on file    Active member of club or organization: Not on file    Attends meetings of clubs or organizations: Not on file    Relationship status: Not on file  Other Topics Concern  . Not on file  Social History Narrative   [place] farm      Has a living will "somewhere"   Requests son Alan Peterson to make decisions for him--now has health care POA   Would accept resuscitation attempts   Probably would want tube feeds   Family History  Problem Relation Age of Onset  . Hypertension Mother   . Coronary artery disease Neg Hx   . Diabetes Neg Hx   . Cancer Neg Hx    Scheduled Meds: . carbidopa-levodopa  1 tablet Oral TID  .  docusate sodium  100 mg Oral BID  . heparin  5,000 Units Subcutaneous Q8H  . hydrocerin   Topical BID   Continuous Infusions: . sodium chloride 75 mL/hr at 12/31/18 2226  . ampicillin-sulbactam (UNASYN) IV 3 g (01/01/19 0200)   PRN Meds:.acetaminophen **OR** acetaminophen, nitroGLYCERIN, ondansetron **OR** ondansetron (ZOFRAN) IV No Known Allergies Review of Systems  Constitutional: Positive for activity change and appetite change.  HENT:  Positive for trouble swallowing.   Respiratory: Negative for shortness of breath.   Neurological: Positive for weakness.    Physical Exam Vitals signs and nursing note reviewed.  Constitutional:      General: He is not in acute distress.    Appearance: He is ill-appearing.  Cardiovascular:     Rate and Rhythm: Normal rate.  Pulmonary:     Effort: Pulmonary effort is normal. No tachypnea, accessory muscle usage or respiratory distress.     Comments: Drooling secretions out right side of mouth Abdominal:     General: Abdomen is flat.  Neurological:     Mental Status: He is alert and oriented to person, place, and time.     Vital Signs: BP (!) 126/59 (BP Location: Left Arm)   Pulse 68   Temp 98.5 F (36.9 C) (Oral)   Resp 16   Ht 5' 6"  (1.676 m)   Wt 71.6 kg   SpO2 92%   BMI 25.47 kg/m  Pain Scale: Faces   Pain Score: 0-No pain   SpO2: SpO2: 92 % O2 Device:SpO2: 92 % O2 Flow Rate: .   IO: Intake/output summary:   Intake/Output Summary (Last 24 hours) at 01/01/2019 0842 Last data filed at 12/31/2018 2334 Gross per 24 hour  Intake 794.62 ml  Output 375 ml  Net 419.62 ml    LBM: Last BM Date: 12/31/18 Baseline Weight: Weight: 69.4 kg Most recent weight: Weight: 71.6 kg     Palliative Assessment/Data: 30%     Time In/Out: 1300-1430 Time Total: 90 min Greater than 50%  of this time was spent counseling and coordinating care related to the above assessment and plan.  Signed by: Vinie Sill, NP Palliative  Medicine Team Pager # [phone] (M-F 8a-5p) Team Phone # [phone] (Nights/Weekends)

## 2019-01-01 NOTE — TOC Progression Note (Signed)
Transition of Care [facility]) - Progression Note    Patient Details  Name: Alan Peterson MRN: [id number] Date of Birth: 1940/11/16  [facility] CM/SW Contact  Elza Rafter, RN Phone Number: 01/01/2019, 11:40 AM  Clinical Narrative:   Spoke with son [name] this morning and explained PT recommendation.  He is going to speak with his father.  [name] feels SNF will be a good idea.  This CM has worked up for SNF placement per son request.  Will continue to follow.      Expected Discharge Plan: [place] Barriers to Discharge: Continued Medical Work up  Expected Discharge Plan and Services Expected Discharge Plan: [place]   Discharge Planning Services: CM Consult   Living arrangements for the past 2 months: Single Family Home                           HH Arranged: Speech Therapy [place]: [place] Date [place]: 12/31/18 Time [place]: 1157 Representative spoke with at [place]: [place] ([place]) Interventions    Readmission Risk Interventions No flowsheet data found.

## 2019-01-01 NOTE — Progress Notes (Signed)
MRI just called me and say that they are unable to do the MRI because of patient's posture, text page Dr. Doy Mince about this, check who is in on call and it says teleneurology. RN will continue to monitor.

## 2019-01-02 ENCOUNTER — Inpatient Hospital Stay: Payer: Medicare Other

## 2019-01-02 DIAGNOSIS — R1310 Dysphagia, unspecified: Secondary | ICD-10-CM

## 2019-01-02 LAB — PROCALCITONIN: Procalcitonin: 27.67 ng/mL

## 2019-01-02 NOTE — Progress Notes (Signed)
Palliative:  HPI: 78 y.o. male  with past medical history of Parkinson's disease, BPH, HLD admitted on 01/10/2019 with SOB, chest pain, weakness with likely aspiration pneumonia. He continues to decline with worsening swallow function during hospitalization.   I met today with Mr. Fils. He is now NPO per SLP assessment and is struggling with any swallow effort and pocketing food. I attempted to discuss with Mr. Riegler but he repeatedly asks me for water and I do not feel that he understands his current situation very well. I attempted to explain and told him that we should speak further with his son and I together and he agrees.   I called to speak with son, [name], and explain his father's declining swallow function today and that he has had changes and decline since yesterday. We discussed progression of Parkinson's disease and the severity of his father's condition. [name] is very much struggling to accept this as progression of his Parkinson's disease as he says his father was doing well and even prepared his own meal and was eating very well just the day prior to admission. I discussed that it only takes one bad swallow to aspirate and cause a pneumonia to trigger a decline sometimes. [name] does not feel that this is likely and feels that more should be explored to rule out other conditions that could have caused his sudden decline.   I spent much time speaking with [name] about his father's diagnosis and poor prognosis. [name] is not at a place of accepting this at this time. I even discussed if we are encouraging him to try and take his medication that he could aspirate and lead to decline. I explained that if this were to occur that intubation would be expected to lead to further decline. [name] explains to me a friend of his who is elderly and recovered from a "coma" for over 30 days. I tried to explain to [name] that his father has advanced dementia and is frail and elderly and would not be able to recover  from like his friend as he continues to decline even in his current state.   After much discussion with [name] he is clear in his position to search for other answers for his father's decline other than progressing Parkinson's disease. He is unwilling at this time to set any limitations to aggressiveness of care. I have discussed with Dr. Posey Pronto, Dr. Doy Mince, SLP Curt Bears, and RN Richardson Landry to encourage them to speak and work with [name]. [name] plans to come to the hospital between 2:30-3pm today and I have requested for the above team to speak with him if they are available to do so at [facility] request.   All questions/concerns addressed to the best of my ability. Emotional support provided.   Exam: More sleepy today. Drooling out right side of mouth. No distress. HR regular rate, irregular rhythm. Breathing regular, unlabored. Abd flat, soft.   Plan: - Unable to get [place] input from patient today. - Family struggling with acceptance of decline and poor prognosis.  - I will follow up tomorrow.   [place], NP Palliative Medicine Team Pager [phone] (Please see [email or web address] for schedule) Team Phone [phone]    Greater than 50%  of this time was spent counseling and coordinating care related to the above assessment and plan

## 2019-01-02 NOTE — Progress Notes (Signed)
Wound care completed at this time, as ordered. Appreciate help of [place], [place]. Patient found with massive amount of stool, cleaned up. Replaced sacral foam pad as well. Of note, didn't find any of the ordered 2x2 or Xeroform guaze on patient. Turned and pulled up in the bed. Timed, dated, and initialed new dressing. Will pass along in shift report. Will continue to monitor wound dressing status for the remainder of the shift. Wenda Low [facility]

## 2019-01-02 NOTE — Progress Notes (Signed)
Advanced care plan.  Purpose of the Encounter: CODE STATUS/feeding and comfort measure  Parties in Attendance: Patient son over the phone  Patient's Decision Capacity: Limited  Subjective/Patient's story: Patient is 78 year old with Parkinson's disease who is admitted with pneumonia now having difficulty with swallowing.   Objective/Medical story I discussed with the son regarding overall poor prognosis with probable Parkinson's getting worse.   Goals of care determination:   I discussed with the son we will try to resume his diet if he continues to aspirate recommended patient be made comfortable no PEG tube feeding at this point son seems to be understanding and is agreeable  CODE STATUS: Full code   Time spent discussing advanced care planning: 16 minutes

## 2019-01-02 NOTE — Progress Notes (Signed)
PT Cancellation Note  Patient Details Name: Alan Peterson MRN: [id number] DOB: [birth year]   Cancelled Treatment:    Reason Eval/Treat Not Completed: Other (comment);Patient's level of consciousness;Fatigue/lethargy limiting ability to participate(Per RN, pt not able to participate in PT at this time secondary to general lethargy, declining health status and incontinence.)   [name] 01/02/2019, 5:07 PM

## 2019-01-02 NOTE — Progress Notes (Signed)
Subjective: Patient continues to have issues with swallowing.  Per family although this may have been an ongoing issue, it is much worse now.  Patient unable to take Sinemet.  Per nursing not even initiating a swallow.    Objective: Current vital signs: BP (!) 143/53 (BP Location: Left Arm)   Pulse (!) 56   Temp 98.1 F (36.7 C) (Oral)   Resp 16   Ht 5\' 6"  (1.676 m)   Wt 73.6 kg   SpO2 99%   BMI 26.20 kg/m  Vital signs in last 24 hours: Temp:  [98.1 F (36.7 C)-99 F (37.2 C)] 98.1 F (36.7 C) (10/22 0801) Pulse Rate:  [51-102] 56 (10/22 0801) Resp:  [16-24] 16 (10/22 0801) BP: (119-143)/(53-85) 143/53 (10/22 0801) SpO2:  [92 %-100 %] 99 % (10/22 0801) Weight:  [73.6 kg] 73.6 kg (10/22 0318)  Intake/Output from previous day: 10/21 0701 - 10/22 0700 In: 1647.4 [I.V.:1447.4; IV Piggyback:200] Out: 100 [Urine:100] Intake/Output this shift: No intake/output data recorded. Nutritional status:  Diet Order            DIET - DYS 1 Room service appropriate? Yes; Fluid consistency: Nectar Thick  Diet effective now              Neurologic Exam: Mental Status: Alert.  Speech dysarthric and minimal.  Masked facies.  Follows commands.   Cranial Nerves: II: Visual fields grossly normal III,IV, VI: ptosis not present, decreased left eye lateral gaze V,VII: left facial droop, facial light touch sensation normal bilaterally VIII: hearing normal bilaterally IX,X: gag reflex present XI: bilateral shoulder shrug XII: midline tongue extension Motor: Increased tone throughout.  Moves all extremities against gravity but weakly, throughout Sensory: Pinprick and light touch intact throughout, bilaterally Deep Tendon Reflexes: Symmetric throughout Plantars: Right: mute                              Left: mute Cerebellar: Unable to perform Gait: not tested due to safety concerns  Lab Results: Basic Metabolic Panel: Recent Labs  Lab 12/18/2018 1517 12/31/18 0446 01/01/19 0531   NA 141 143 145  K 4.9 5.0 4.2  CL 108 114* 116*  CO2 18* 17* 20*  GLUCOSE 196* 102* 118*  BUN 40* 57* 74*  CREATININE 1.96* 1.89* 2.13*  CALCIUM 9.5 8.3* 8.0*    Liver Function Tests: No results for input(s): AST, ALT, ALKPHOS, BILITOT, PROT, ALBUMIN in the last 168 hours. No results for input(s): LIPASE, AMYLASE in the last 168 hours. No results for input(s): AMMONIA in the last 168 hours.  CBC: Recent Labs  Lab 12/20/2018 1517 12/31/18 0446 01/01/19 0531  WBC 5.1 6.9 6.6  HGB 15.4 13.8 13.7  HCT 45.9 41.0 40.5  MCV 94.8 92.8 92.9  PLT 231 168 153    Cardiac Enzymes: No results for input(s): CKTOTAL, CKMB, CKMBINDEX, TROPONINI in the last 168 hours.  Lipid Panel: No results for input(s): CHOL, TRIG, HDL, CHOLHDL, VLDL, LDLCALC in the last 168 hours.  CBG: No results for input(s): GLUCAP in the last 168 hours.  Microbiology: Results for orders placed or performed during the hospital encounter of 12/15/2018  Blood Culture (routine x 2)     Status: None (Preliminary result)   Collection Time: 12/25/2018  4:18 PM   Specimen: BLOOD  Result Value Ref Range Status   Specimen Description BLOOD LEFT ANTECUBITAL  Final   Special Requests   Final    BOTTLES DRAWN  AEROBIC AND ANAEROBIC Blood Culture adequate volume   Culture   Final    NO GROWTH 3 DAYS Performed at [facility], [address] [place], [place] [address]    Report Status PENDING  Incomplete  Blood Culture (routine x 2)     Status: None (Preliminary result)   Collection Time: 05/06/2018  4:23 PM   Specimen: BLOOD  Result Value Ref Range Status   Specimen Description BLOOD BLOOD RIGHT FOREARM  Final   Special Requests   Final    BOTTLES DRAWN AEROBIC AND ANAEROBIC Blood Culture adequate volume   Culture   Final    NO GROWTH 3 DAYS Performed at [facility], [address] [place], [place] [address]    Report Status PENDING  Incomplete  SARS CORONAVIRUS 2 (TAT 6-24 HRS) Nasopharyngeal  Nasopharyngeal Swab     Status: None   Collection Time: 05/06/2018  4:45 PM   Specimen: Nasopharyngeal Swab  Result Value Ref Range Status   SARS Coronavirus 2 NEGATIVE NEGATIVE Final    Comment: (NOTE) SARS-CoV-2 target nucleic acids are NOT DETECTED. The SARS-CoV-2 RNA is generally detectable in upper and lower respiratory specimens during the acute phase of infection. Negative results do not preclude SARS-CoV-2 infection, do not rule out co-infections with other pathogens, and should not be used as the sole basis for treatment or other patient management decisions. Negative results must be combined with clinical observations, patient history, and epidemiological information. The expected result is Negative. Fact Sheet for Patients: HairSlick.nohttps://www.fda.gov/media/138098/download Fact Sheet for Healthcare Providers: quierodirigir.comhttps://www.fda.gov/media/138095/download This test is not yet approved or cleared by the [place] FDA and  has been authorized for detection and/or diagnosis of SARS-CoV-2 by FDA under an Emergency Use Authorization (EUA). This EUA will remain  in effect (meaning this test can be used) for the duration of the COVID-19 declaration under Section 56 4(b)(1) of the Act, 21 U.S.C. section 360bbb-3(b)(1), unless the authorization is terminated or revoked sooner. Performed at [facility], [address] [place], [place] [address]   MRSA PCR Screening     Status: None   Collection Time: 12/31/18  8:42 AM   Specimen: Nasal Mucosa; Nasopharyngeal  Result Value Ref Range Status   MRSA by PCR NEGATIVE NEGATIVE Final    Comment:        The GeneXpert MRSA Assay (FDA approved for NASAL specimens only), is one component of a comprehensive MRSA colonization surveillance program. It is not intended to diagnose MRSA infection nor to guide or monitor treatment for MRSA infections. Performed at [facility], [address] [place], [place] [address]      Coagulation Studies: No results for input(s): LABPROT, INR in the last 72 hours.  Imaging: Ct Head Wo Contrast  Result Date: 01/02/2019 CLINICAL DATA:  Altered level of consciousness and weakness EXAM: CT HEAD WITHOUT CONTRAST TECHNIQUE: Contiguous axial images were obtained from the base of the skull through the vertex without intravenous contrast. COMPARISON:  08/24/2017 FINDINGS: Brain: Considerable motion artifact is identified with severe limitations on the exam. There is persistent increased density along the anterior cranial fossa particularly on the left similar to that seen on the prior exam. Considerable beam hardening artifact is identified limiting the examination although the persistent density is suspicious for underlying meningioma. If the patient can undergo MRI examination that would be helpful. Diffuse atrophic changes are seen. Chronic white matter ischemic changes are noted. No findings to suggest acute hemorrhage or acute infarction are  seen. Vascular: No hyperdense vessel or unexpected calcification. Skull: Normal. Negative for fracture or focal lesion. Sinuses/Orbits: No acute finding. Other: Stable fluid within the mastoid air cells on the left. IMPRESSION: Chronic atrophic and ischemic changes stable from previous exam. Persistent increased density is noted along the base of the anterior cranial fossa particularly on the left. Although this is likely related to underlying beam hardening artifact the possibility of a meningioma could not be totally excluded. MRI would be helpful in this regard. Electronically Signed   By: Alcide Clever M.D.   On: 01/02/2019 10:54    Medications:  I have reviewed the patient's current medications. Scheduled: . carbidopa-levodopa  1 tablet Oral TID  . heparin  5,000 Units Subcutaneous Q8H  . hydrocerin   Topical BID    Assessment/Plan: Swallowing issues continue.  Patient not initiating swallow at this time.  Unable to take Sinemet.   Since not initiating swallow, will likely not benefit from Parcopa either.  Patient has been on Neupro in the past with some benefit but this is not on formulary.  Have spoken with pharmacy about the possibility of obtaining.   Although stroke in the differential, patient too kyphotic for MRI.  Will have to be attempted on an outpatient basis in an open MRI.  Would not change the course of treatment otherwise at this time.  Patient undergoing therapy.      LOS: 3 days   Thana Farr, MD Neurology [phone] 01/02/2019  3:03 PM  Addendum: Son was to be at bedside between 230 and 3.  I came to the room during that time and no family was present.    Thana Farr, MD Neurology [phone]

## 2019-01-02 NOTE — Plan of Care (Signed)
  Problem: Education: Goal: Knowledge of General Education information will improve Description: Including pain rating scale, medication(s)/side effects and non-pharmacologic comfort measures Outcome: Not Progressing Note: Patient fatigued / drowsy. Has severe Parkinson's. Son at bedside. Will not swallow anything. Patient for a barium swallow evaluation at some point. Will continue to monitor neurological status. Neurology is following. Wenda Low [facility]

## 2019-01-02 NOTE — Care Management Important Message (Signed)
Important Message  Patient Details  Name: Alan Peterson MRN: [id number] Date of Birth: 09/15/1940   Medicare Important Message Given:  Yes  Reviewed with son, Andreas Sobolewski, at [phone].  Requested copy be sent to [email or web address]. Copy of Medicare IM sent securely to email provided.  Copy left in patient's room for reference as well.   Dannette Barbara 01/02/2019, 2:35 PM

## 2019-01-02 NOTE — Progress Notes (Signed)
[facility] - [place] at [facility]                                                                                                                                                                                  Patient Demographics   Alan Peterson, is a 78 y.o. male, DOB - 1940/09/15, ZOX:[id number]  Admit date - 12/24/2018   Admitting Physician Ihor Austin, MD  Outpatient Primary MD for the patient is Karie Schwalbe, MD   LOS - 3  Subjective: Patient continues to ask for water.   Review of Systems:   CONSTITUTIONAL: No documented fever. No fatigue, weakness. No weight gain, no weight loss.  EYES: No blurry or double vision.  ENT: No tinnitus. No postnasal drip. No redness of the oropharynx.  RESPIRATORY: No cough, no wheeze, no hemoptysis. No dyspnea.  CARDIOVASCULAR: No chest pain. No orthopnea. No palpitations. No syncope.  GASTROINTESTINAL: No nausea, no vomiting or diarrhea. No abdominal pain. No melena or hematochezia.  GENITOURINARY: No dysuria or hematuria.  ENDOCRINE: No polyuria or nocturia. No heat or cold intolerance.  HEMATOLOGY: No anemia. No bruising. No bleeding.  INTEGUMENTARY: No rashes. No lesions.  MUSCULOSKELETAL: No arthritis. No swelling. No gout.  NEUROLOGIC: Difficulty with swallowing PSYCHIATRIC: No anxiety. No insomnia. No ADD.    Vitals:   Vitals:   01/01/19 1610 01/01/19 1947 01/02/19 0318 01/02/19 0801  BP: 119/85 (!) 124/54 134/64 (!) 143/53  Pulse: (!) 102 (!) 51 84 (!) 56  Resp: 18 (!) 24 20 16   Temp: 99 F (37.2 C) 98.1 F (36.7 C) 98.1 F (36.7 C) 98.1 F (36.7 C)  TempSrc: Oral Oral Oral Oral  SpO2: 92% 100% 96% 99%  Weight:   73.6 kg   Height:        Wt Readings from Last 3 Encounters:  01/02/19 73.6 kg  12/07/17 70.8 kg  06/04/17 73.5 kg     Intake/Output Summary (Last 24 hours) at 01/02/2019 1220 Last data filed at 01/02/2019 0549 Gross per 24 hour  Intake 1647.44 ml  Output 100 ml  Net 1547.44  ml    Physical Exam:   GENERAL: Pleasant-appearing in no apparent distress.  HEAD, EYES, EARS, NOSE AND THROAT: Atraumatic, normocephalic. Extraocular muscles are intact. Pupils equal and reactive to light. Sclerae anicteric. No conjunctival injection. No oro-pharyngeal erythema.  NECK: Supple. There is no jugular venous distention. No bruits, no lymphadenopathy, no thyromegaly.  HEART: Regular rate and rhythm,. No murmurs, no rubs, no clicks.  LUNGS: Clear to auscultation bilaterally. No rales or rhonchi. No wheezes.  ABDOMEN: Soft, flat, nontender, nondistended. Has good bowel sounds. No hepatosplenomegaly appreciated.  EXTREMITIES: No evidence of  any cyanosis, clubbing, or peripheral edema.  +2 pedal and radial pulses bilaterally.  NEUROLOGIC: Limited.  SKIN: Moist and warm with no rashes appreciated.  Psych: Not anxious, depressed LN: No inguinal LN enlargement    Antibiotics   Anti-infectives (From admission, onward)   Start     Dose/Rate Route Frequency Ordered Stop   12/31/18 1600  ceFEPIme (MAXIPIME) 2 g in sodium chloride 0.9 % 100 mL IVPB  Status:  Discontinued     2 g 200 mL/hr over 30 Minutes Intravenous Every 24 hours 12/12/2018 1858 12/31/18 1158   12/31/18 1207  Ampicillin-Sulbactam (UNASYN) 3 g in sodium chloride 0.9 % 100 mL IVPB     3 g 200 mL/hr over 30 Minutes Intravenous Every 12 hours 12/31/18 1207     12/31/18 1200  Ampicillin-Sulbactam (UNASYN) 3 g in sodium chloride 0.9 % 100 mL IVPB  Status:  Discontinued     3 g 200 mL/hr over 30 Minutes Intravenous Every 6 hours 12/31/18 1158 12/31/18 1207   12/13/2018 1849  vancomycin variable dose per unstable renal function (pharmacist dosing)  Status:  Discontinued      Does not apply See admin instructions 01/03/2019 1858 12/31/18 1158   01/03/2019 1800  vancomycin (VANCOCIN) 500 mg in sodium chloride 0.9 % 100 mL IVPB     500 mg 100 mL/hr over 60 Minutes Intravenous  Once 12/26/2018 1628 01/07/2019 1941   12/17/2018 1630   ceFEPIme (MAXIPIME) 2 g in sodium chloride 0.9 % 100 mL IVPB     2 g 200 mL/hr over 30 Minutes Intravenous  Once 12/12/2018 1617 12/31/2018 1701   12/19/2018 1630  vancomycin (VANCOCIN) IVPB 1000 mg/200 mL premix     1,000 mg 200 mL/hr over 60 Minutes Intravenous  Once 12/16/2018 1617 12/29/2018 1838      Medications   Scheduled Meds: . carbidopa-levodopa  1 tablet Oral TID  . docusate sodium  100 mg Oral BID  . heparin  5,000 Units Subcutaneous Q8H  . hydrocerin   Topical BID   Continuous Infusions: . sodium chloride 75 mL/hr at 01/02/19 0549  . ampicillin-sulbactam (UNASYN) IV 3 g (01/02/19 1118)   PRN Meds:.acetaminophen **OR** acetaminophen, nitroGLYCERIN, ondansetron **OR** ondansetron (ZOFRAN) IV   Data Review:   Micro Results Recent Results (from the past 240 hour(s))  Blood Culture (routine x 2)     Status: None (Preliminary result)   Collection Time: 12/23/2018  4:18 PM   Specimen: BLOOD  Result Value Ref Range Status   Specimen Description BLOOD LEFT ANTECUBITAL  Final   Special Requests   Final    BOTTLES DRAWN AEROBIC AND ANAEROBIC Blood Culture adequate volume   Culture   Final    NO GROWTH 3 DAYS Performed at [facility], [address] [place], [place] [address]    Report Status PENDING  Incomplete  Blood Culture (routine x 2)     Status: None (Preliminary result)   Collection Time: 01/09/2019  4:23 PM   Specimen: BLOOD  Result Value Ref Range Status   Specimen Description BLOOD BLOOD RIGHT FOREARM  Final   Special Requests   Final    BOTTLES DRAWN AEROBIC AND ANAEROBIC Blood Culture adequate volume   Culture   Final    NO GROWTH 3 DAYS Performed at [facility], [place], [place] [address]    Report Status PENDING  Incomplete  SARS CORONAVIRUS 2 (TAT 6-24 HRS) Nasopharyngeal Nasopharyngeal Swab     Status: None  Collection Time: 12/25/2018  4:45 PM   Specimen: Nasopharyngeal Swab  Result Value Ref Range Status   SARS  Coronavirus 2 NEGATIVE NEGATIVE Final    Comment: (NOTE) SARS-CoV-2 target nucleic acids are NOT DETECTED. The SARS-CoV-2 RNA is generally detectable in upper and lower respiratory specimens during the acute phase of infection. Negative results do not preclude SARS-CoV-2 infection, do not rule out co-infections with other pathogens, and should not be used as the sole basis for treatment or other patient management decisions. Negative results must be combined with clinical observations, patient history, and epidemiological information. The expected result is Negative. Fact Sheet for Patients: HairSlick.nohttps://www.fda.gov/media/138098/download Fact Sheet for Healthcare Providers: quierodirigir.comhttps://www.fda.gov/media/138095/download This test is not yet approved or cleared by the [place] FDA and  has been authorized for detection and/or diagnosis of SARS-CoV-2 by FDA under an Emergency Use Authorization (EUA). This EUA will remain  in effect (meaning this test can be used) for the duration of the COVID-19 declaration under Section 56 4(b)(1) of the Act, 21 U.S.C. section 360bbb-3(b)(1), unless the authorization is terminated or revoked sooner. Performed at [facility], [address] [place], [place] [address]   MRSA PCR Screening     Status: None   Collection Time: 12/31/18  8:42 AM   Specimen: Nasal Mucosa; Nasopharyngeal  Result Value Ref Range Status   MRSA by PCR NEGATIVE NEGATIVE Final    Comment:        The GeneXpert MRSA Assay (FDA approved for NASAL specimens only), is one component of a comprehensive MRSA colonization surveillance program. It is not intended to diagnose MRSA infection nor to guide or monitor treatment for MRSA infections. Performed at [facility], [address] [place], [place] [address]     Radiology Reports Ct Head Wo Contrast  Result Date: 01/02/2019 CLINICAL DATA:  Altered level of consciousness and weakness EXAM: CT HEAD WITHOUT  CONTRAST TECHNIQUE: Contiguous axial images were obtained from the base of the skull through the vertex without intravenous contrast. COMPARISON:  08/24/2017 FINDINGS: Brain: Considerable motion artifact is identified with severe limitations on the exam. There is persistent increased density along the anterior cranial fossa particularly on the left similar to that seen on the prior exam. Considerable beam hardening artifact is identified limiting the examination although the persistent density is suspicious for underlying meningioma. If the patient can undergo MRI examination that would be helpful. Diffuse atrophic changes are seen. Chronic white matter ischemic changes are noted. No findings to suggest acute hemorrhage or acute infarction are seen. Vascular: No hyperdense vessel or unexpected calcification. Skull: Normal. Negative for fracture or focal lesion. Sinuses/Orbits: No acute finding. Other: Stable fluid within the mastoid air cells on the left. IMPRESSION: Chronic atrophic and ischemic changes stable from previous exam. Persistent increased density is noted along the base of the anterior cranial fossa particularly on the left. Although this is likely related to underlying beam hardening artifact the possibility of a meningioma could not be totally excluded. MRI would be helpful in this regard. Electronically Signed   By: Alcide CleverMark  Lukens M.D.   On: 01/02/2019 10:54   Dg Chest Portable 1 View  Result Date: 12/28/2018 CLINICAL DATA:  Chest pain, shortness of breath. EXAM: PORTABLE CHEST 1 VIEW COMPARISON:  None. FINDINGS: The heart size and mediastinal contours are within normal limits. Right lung is clear. No pneumothorax is noted. Mild left basilar atelectasis is noted with probable small left pleural effusion. The visualized skeletal structures are unremarkable. IMPRESSION: Mild  left basilar subsegmental atelectasis with probable small left pleural effusion. Electronically Signed   By: Lupita Raider  M.D.   On: 01/01/2019 15:52     CBC Recent Labs  Lab 12/14/2018 1517 12/31/18 0446 01/01/19 0531  WBC 5.1 6.9 6.6  HGB 15.4 13.8 13.7  HCT 45.9 41.0 40.5  PLT 231 168 153  MCV 94.8 92.8 92.9  MCH 31.8 31.2 31.4  MCHC 33.6 33.7 33.8  RDW 12.9 13.1 13.2    Chemistries  Recent Labs  Lab 12/29/2018 1517 12/31/18 0446 01/01/19 0531  NA 141 143 145  K 4.9 5.0 4.2  CL 108 114* 116*  CO2 18* 17* 20*  GLUCOSE 196* 102* 118*  BUN 40* 57* 74*  CREATININE 1.96* 1.89* 2.13*  CALCIUM 9.5 8.3* 8.0*   ------------------------------------------------------------------------------------------------------------------ estimated creatinine clearance is 25 mL/min (A) (by C-G formula based on SCr of 2.13 mg/dL (H)). ------------------------------------------------------------------------------------------------------------------ No results for input(s): HGBA1C in the last 72 hours. ------------------------------------------------------------------------------------------------------------------ No results for input(s): CHOL, HDL, LDLCALC, TRIG, CHOLHDL, LDLDIRECT in the last 72 hours. ------------------------------------------------------------------------------------------------------------------ No results for input(s): TSH, T4TOTAL, T3FREE, THYROIDAB in the last 72 hours.  Invalid input(s): FREET3 ------------------------------------------------------------------------------------------------------------------ No results for input(s): VITAMINB12, FOLATE, FERRITIN, TIBC, IRON, RETICCTPCT in the last 72 hours.  Coagulation profile No results for input(s): INR, PROTIME in the last 168 hours.  No results for input(s): DDIMER in the last 72 hours.  Cardiac Enzymes No results for input(s): CKMB, TROPONINI, MYOGLOBIN in the last 168 hours.  Invalid input(s): CK ------------------------------------------------------------------------------------------------------------------ Invalid  input(s): POCBNP    Assessment & Plan    78 yr old male patient with a known history of parkinsons disease, BPH , hyperlipidemia presents to ER with generalized weakness and difficulty breathing. Patient also complaints of chest discomfort on deep breathing..  -Sepsis Continue antibiotics repeat procalcitonin level   -Left lung early pneumonia Continue Unasyn  -Atypical chest pain reproducible in nature Could be from pneumonia Mildly elevated troponins from demand ischemia no further chest pain   -Dehydration IV fluids  -Parkinson's with worsening has been seen by neurology  -Dysphagia patient has severe dysphagia is pocketing his food.  CT scan of the head shows no evidence of stroke I suspect patient has worsening of his Parkinson's leading to the situation.  I have discussed with the son regarding his condition.  And offered him options.  I definitely recommended no PEG tube.   -Patient needs continued stay in the hospital due to the fact that he is continue to have dysphagia which is severe he is not able to take much oral intake.   -DVT prophylaxis with Pope heparin      Code Status Orders  (From admission, onward)         Start     Ordered   12/15/2018 2021  Full code  Continuous     12/31/2018 2020        Code Status History    This patient has a current code status but no historical code status.   Advance Care Planning Activity    Advance Directive Documentation     Most Recent Value  Type of Advance Directive  Living will  Pre-existing out of facility DNR order (yellow form or pink MOST form)  -  "MOST" Form in Place?  -           Consults none  DVT Prophylaxis  Lovenox   Lab Results  Component Value Date   PLT 153 01/01/2019  Time Spent in minutes 35 minutes  Greater than 50% of time spent in care coordination and counseling patient regarding the condition and plan of care.   Auburn Bilberry M.D on 01/02/2019 at 12:20  PM  Between 7am to 6pm - Pager - [phone]  After 6pm go to [email or web address] - Social research officer, government  [employer]   Office  [phone]

## 2019-01-02 NOTE — Progress Notes (Addendum)
SLP Cancellation Note  Patient Details Name: Alan Peterson MRN: [id number] DOB: May 02, 1940   Cancelled treatment:       Reason Eval/Treat Not Completed: Patient not medically ready;Medical issues which prohibited therapy(chart reviewed; consulted NSG re: pt's status). Pt is now NPO d/t increased dysphagia (oral phase coordination and transfer deficits as well) and increased risk for aspiration/choking. Pt described ongoing swallowing problems at home prior to admission. Recommend continued oral care for hygiene and stimulation of swallowing. Will await further intervention and recommendation from Neurology as pt's physiology of swallowing impacted by his Parkinson's Dis. has declined at this time to the point of making him unsafe for oral intake currently. NSG agreed.  Addendum: MBSS ordered for tomorrow. Discussion w/ Palliative Care NP.     Orinda Kenner, MS, CCC-SLP Watson,Katherine 01/02/2019, 9:55 AM

## 2019-01-03 ENCOUNTER — Inpatient Hospital Stay: Payer: Medicare Other

## 2019-01-03 DIAGNOSIS — G20 Parkinson's disease: Secondary | ICD-10-CM

## 2019-01-03 DIAGNOSIS — G20A1 Parkinson's disease without dyskinesia, without mention of fluctuations: Secondary | ICD-10-CM

## 2019-01-03 MED ORDER — POLYVINYL ALCOHOL 1.4 % OP SOLN
1.0000 [drp] | OPHTHALMIC | Status: DC
  Administered 2019-01-03 – 2019-01-08 (×30): 1 [drp] via OPHTHALMIC
  Filled 2019-01-03: qty 15

## 2019-01-03 MED ORDER — ROTIGOTINE 6 MG/24HR TD PT24
1.0000 | MEDICATED_PATCH | Freq: Every day | TRANSDERMAL | Status: DC
  Administered 2019-01-03 – 2019-01-08 (×4): 1 via TRANSDERMAL
  Filled 2019-01-03 (×6): qty 1

## 2019-01-03 NOTE — Evaluation (Addendum)
Objective Swallowing Evaluation: Type of Study: MBS-Modified Barium Swallow Study   Patient Details  Name: Alan Peterson MRN: [id number] Date of Birth: 1941/02/20  Today's Date: 01/03/2019 Time: SLP Start Time (ACUTE ONLY): 1354 -SLP Stop Time (ACUTE ONLY): 1454  SLP Time Calculation (min) (ACUTE ONLY): 60 min   Past Medical History:  Past Medical History:  Diagnosis Date  . BPH (benign prostatic hypertrophy)   . Hyperlipidemia   . Parkinson's disease    Past Surgical History:  Past Surgical History:  Procedure Laterality Date  . FINGER CONTRACTURE RELEASE  1980   right, left 4th finger release 8/10   HPI: Pt is a 78 y.o. male with a known history of Parkinson's Disease, BPH , hyperlipidemia presents to ER with generalized weakness and difficulty breathing. Patient also complaints of chest discomfort on deep breathing.. Uses a walker at home for ambulation. No recent travel and sick contacts. Covid 19 test negative; CXR revealed left lung atelectasis.  Pt stated he has had increasing difficulty eating/swallowing in the past few "days".    Subjective: Pt just transferred to [facility] chair- alert and verbally responsive    Assessment / Plan / Recommendation  CHL IP CLINICAL IMPRESSIONS 01/03/2019  Clinical Impression The patient is presenting with severe oropharyngeal dysphagia.  The patient was verbally responsive and agreed to try POs for this test.  The patient is severely kyphotic preventing optimal positioning for safe swallowing.  He is not able to raise his head and reflexively resists external lifting by SLP.  This posture places his pharynx parallel to the floor.  The patient was not able to effect posterior transfer of the first puree trial.  He requested water.  This was given with a spoon.  The patient was able to effect posterior transfer of the puree + water bolus and swallow. There is reduced tongue base retraction with vallecular residue post swallow but no laryngeal  penetration or aspiration. The patient was not able to pull liquid via straw.  He was able to take 3 more thin liquid trials (spoon) and 2 nectar-thick liquid (spoon) with no laryngeal penetration or aspiration.  He was then able to propel a puree trial and swallow with no laryngeal penetration or aspiration.  While Parkinson's and kyphosis contribute to oropharyngeal dysphagia, the current severity of dysphagia appears to be due to his debilitation.  Given his BMI on admission (25) he was maintaining good oral intake prior to this medical event.  Recommend NPO with sips of water BY SPOON when patient is alert, verbal, and willing to swallow.  He is at SIGNIFICANT risk for malnutrition unless his overall strength improves.  Speech will follow for ongoing assessment of swallow function.  ADDENDUM: The patient's nurse and son report that he has been alert, verbally responsive and oriented to swallowing since mid morning.  Given this improvement and no penetration/aspiration on MBSS, recommend resuming diet (dysphagia 1 with thin liquid BY SPOON)    SLP Visit Diagnosis Dysphagia, oropharyngeal phase (R13.12)  Attention and concentration deficit following --  Frontal lobe and executive function deficit following --  Impact on safety and function Moderate aspiration risk;Risk for inadequate nutrition/hydration      CHL IP TREATMENT RECOMMENDATION 01/03/2019  Treatment Recommendations Therapy as outlined in treatment plan below     Prognosis 01/03/2019  Prognosis for Safe Diet Advancement Guarded  Barriers to Reach Goals Time post onset;Severity of deficits  Barriers/Prognosis Comment --    CHL IP DIET RECOMMENDATION 01/03/2019  SLP Diet  Recommendations NPO;Other (Comment) sips of water by spoon  Liquid Administration via --  Medication Administration --  Compensations --  Postural Changes --      CHL IP OTHER RECOMMENDATIONS 01/03/2019  Recommended Consults --  Oral Care Recommendations Oral  care BID;Staff/trained caregiver to provide oral care  Other Recommendations --      CHL IP FOLLOW UP RECOMMENDATIONS 01/03/2019  Follow up Recommendations Skilled Nursing facility      [facility] IP FREQUENCY AND DURATION 01/03/2019  Speech Therapy Frequency (ACUTE ONLY) min 3x week  Treatment Duration 2 weeks           CHL IP ORAL PHASE 01/03/2019  Oral Phase Impaired  Oral - Pudding Teaspoon --  Oral - Pudding Cup --  Oral - Honey Teaspoon --  Oral - Honey Cup --  Oral - Nectar Teaspoon --  Oral - Nectar Cup --  Oral - Nectar Straw --  Oral - Thin Teaspoon --  Oral - Thin Cup --  Oral - Thin Straw --  Oral - Puree --  Oral - Mech Soft --  Oral - Regular --  Oral - Multi-Consistency --  Oral - Pill --  Oral Phase - Comment WEAK, unable to tranfser solid bolus    CHL IP PHARYNGEAL PHASE 01/03/2019  Pharyngeal Phase Impaired  Pharyngeal- Pudding Teaspoon --  Pharyngeal --  Pharyngeal- Pudding Cup --  Pharyngeal --  Pharyngeal- Honey Teaspoon --  Pharyngeal --  Pharyngeal- Honey Cup --  Pharyngeal --  Pharyngeal- Nectar Teaspoon --  Pharyngeal --  Pharyngeal- Nectar Cup --  Pharyngeal --  Pharyngeal- Nectar Straw --  Pharyngeal --  Pharyngeal- Thin Teaspoon --  Pharyngeal --  Pharyngeal- Thin Cup --  Pharyngeal --  Pharyngeal- Thin Straw --  Pharyngeal --  Pharyngeal- Puree --  Pharyngeal --  Pharyngeal- Mechanical Soft --  Pharyngeal --  Pharyngeal- Regular --  Pharyngeal --  Pharyngeal- Multi-consistency --  Pharyngeal --  Pharyngeal- Pill --  Pharyngeal --  Pharyngeal Comment Once triggered, mild pharyngeal dysphagia     CHL IP CERVICAL ESOPHAGEAL PHASE 01/03/2019  Cervical Esophageal Phase (No Data)  Pudding Teaspoon --  Pudding Cup --  Honey Teaspoon --  Honey Cup --  Nectar Teaspoon --  Nectar Cup --  Nectar Straw --  Thin Teaspoon --  Thin Cup --  Thin Straw --  Puree --  Mechanical Soft --  Regular --  Multi-consistency --  Pill --   Cervical Esophageal Comment --    Leroy Sea, MS/CCC- SLP  Valetta Fuller, Susie 01/03/2019, 2:56 PM

## 2019-01-03 NOTE — Progress Notes (Signed)
Physical Therapy Treatment Patient Details Name: Alan Peterson MRN: [id number] DOB: 1940/07/10 Today's Date: 01/03/2019    History of Present Illness Pt is an 78 y/o M presented to the ED on 10/19 with complaints of shortness of breath and bilateral rib and chest pain. 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 Imaging revealed L basilar atelectasis w/L pleural effusion, possibly indicative of early pneumonia, which MD suspects may be responsible for chest pain.    PT Comments    Patient more alert this date, answering questions and interacting with care team (though generally soft spoken and minimal in answers).  Globally weak and deconditioned throughout all extremities, requiring act assist from therapist to initiate/complete movement in all functional movement patterns.  Generally rigid throughout with limited dissociation of trunk and extremities. Continues to require max/total assist for all bed mobility and repositioning; unable to tolerate transition towards edge of bed (and refused OOB to chair) this date.       Follow Up Recommendations  SNF     Equipment Recommendations  Rolling walker with 5" wheels;Wheelchair (measurements PT);Wheelchair cushion (measurements PT)    Recommendations for Other Services       Precautions / Restrictions Precautions Precautions: Fall Restrictions Weight Bearing Restrictions: No    Mobility  Bed Mobility Overal bed mobility: Needs Assistance   Rolling: Max assist;Total assist            Transfers                 General transfer comment: deferred due to fatigue  Ambulation/Gait             General Gait Details: deferred due to fatigue   Stairs             Wheelchair Mobility    Modified Rankin (Stroke Patients Only)       Balance                                            Cognition  Arousal/Alertness: Awake/alert Behavior During Therapy: Flat affect Overall Cognitive Status: No family/caregiver present to determine baseline cognitive functioning                                 General Comments: Pt alert and oriented to person, place, situation and time (with exception of answering only month and year correctly). Generally flat affect, some difficulty following commands though participates as able with repeated verbal and tactile cuing.      Exercises Other Exercises Other Exercises: Supine LE therex, 1x10, act assist ROM for muscular strength/endurance with functional activities.  Requires hand-over-hand assist to initiate and complete movement throughout all planes; generally weak and deconditioned throughout. Other Exercises: Rolling bilat, max/total assist for each direction; hand-over-hand assist for UE placement and assist on bedrails.  Generally rigid with poor dissociation of trunk and extremities.  Dep for hygiene after incontinent bladder/bowel episode (patient unaware).    General Comments        Pertinent Vitals/Pain Pain Assessment: No/denies pain    Home Living                      Prior Function            PT  Goals (current goals can now be found in the care plan section) Acute Rehab PT Goals Patient Stated Goal: to go home PT Goal Formulation: With patient Time For Goal Achievement: 01/14/19 Potential to Achieve Goals: Fair Progress towards PT goals: Progressing toward goals    Frequency    Min 2X/week      PT Plan Current plan remains appropriate    Co-evaluation              AM-PAC PT "6 Clicks" Mobility   Outcome Measure  Help needed turning from your back to your side while in a flat bed without using bedrails?: Total Help needed moving from lying on your back to sitting on the side of a flat bed without using bedrails?: Total Help needed moving to and from a bed to a chair (including a  wheelchair)?: Total Help needed standing up from a chair using your arms (e.g., wheelchair or bedside chair)?: Total Help needed to walk in hospital room?: Total Help needed climbing 3-5 steps with a railing? : Total 6 Click Score: 6    End of Session   Activity Tolerance: Patient tolerated treatment well;Patient limited by fatigue Patient left: in bed;with call bell/phone within reach;with bed alarm set Nurse Communication: Mobility status PT Visit Diagnosis: Unsteadiness on feet (R26.81);Muscle weakness (generalized) (M62.81);Difficulty in walking, not elsewhere classified (R26.2);Other symptoms and signs involving the nervous system (A35.573)     Time: 2202-5427 PT Time Calculation (min) (ACUTE ONLY): 30 min  Charges:  $Therapeutic Exercise: 8-22 mins $Therapeutic Activity: 8-22 mins                     Sheehan Stacey H. Manson Passey, PT, DPT, NCS 01/03/19, 3:06 PM [phone]

## 2019-01-03 NOTE — Progress Notes (Signed)
Palliative:  HPI: 78 y.o.malewith past medical history of Parkinson's disease, BPH, HLDadmitted on 10/19/2020with SOB, chest pain, weakness with likely aspiration pneumonia. He continues to decline with worsening swallow function during hospitalization.   I met today at Alan Peterson bedside. He is more alert and talkative today. Speech is improved from yesterday. He requests water but is not interested in his breakfast right now. He had difficulty opening his eyes (he tells me) so I washed his face with warm wash cloth and then ordered eye drops to assist.   I spoke with him further about the expectation of progression with Parkinson's disease. I discussed with him his wishes at EOL. We specifically discussed resuscitation and he does tell me that he does not believe he would want to go through this but his family was so upset about this. I encouraged him to express his desires to [name] and that we can all try and help with these conversations.   I missed Alan Peterson in the room this afternoon but did speak with him over the phone. He is pleased with his father's progress. He was not pleased with his conversation with SLP. I attempted to re-educate about silent aspiration and aspiration of secretions. I explained that we know he is high risk because of his Parkinson's disease and that this risk is only going to increase over time. Alan Peterson understands. We discussed close monitoring and discussed transition to rehab as appropriate if he remains stable to do so.   All questions/concerns addressed. Emotional support provided.   Exam: Alert, oriented. No distress. Breathing regular, unlabored. Drooling from right side of mouth. Abd soft, flat. Generalized weakness.   Plan: - I will follow up Monday.  - No changed to [place]. Continue aggressive care.   [place], NP Palliative Medicine Team Pager [phone] (Please see [email or web address] for schedule) Team Phone [phone]    Greater than 50%  of  this time was spent counseling and coordinating care related to the above assessment and plan

## 2019-01-03 NOTE — Progress Notes (Signed)
[place] at [facility]                                                                                                                                                                                  Patient Demographics   Alan Peterson, is a 78 y.o. male, DOB - 1940/09/06, ZOX:[id number]  Admit date - 01/03/2019   Admitting Physician Saundra Shelling, MD  Outpatient Primary MD for the patient is Alan Carbon, MD   LOS - 4  Subjective: Patient doing well similar as yesterday no significant changes   Review of Systems:   CONSTITUTIONAL: Unable to provide   Vitals:   Vitals:   01/02/19 1731 01/02/19 2104 01/03/19 0547 01/03/19 0732  BP: 126/60 (!) 103/59 (!) 115/52 (!) 119/59  Pulse: 85 69 73 72  Resp: 16 18 (!) 21 18  Temp:  99.1 F (37.3 C) 99.5 F (37.5 C) 98.1 F (36.7 C)  TempSrc:  Oral Oral Oral  SpO2: 96% 97% 98% 99%  Weight:   73 kg   Height:        Wt Readings from Last 3 Encounters:  01/03/19 73 kg  12/07/17 70.8 kg  06/04/17 73.5 kg     Intake/Output Summary (Last 24 hours) at 01/03/2019 1345 Last data filed at 01/03/2019 0400 Gross per 24 hour  Intake 1703.92 ml  Output 900 ml  Net 803.92 ml    Physical Exam:   GENERAL: Pleasant-appearing looks similar to yesterday.  HEAD, EYES, EARS, NOSE AND THROAT: Atraumatic, normocephalic. Extraocular muscles are intact. Pupils equal and reactive to light. Sclerae anicteric. No conjunctival injection. No oro-pharyngeal erythema.  NECK: Supple. There is no jugular venous distention. No bruits, no lymphadenopathy, no thyromegaly.  HEART: Regular rate and rhythm,. No murmurs, no rubs, no clicks.  LUNGS: Clear to auscultation bilaterally. No rales or rhonchi. No wheezes.  ABDOMEN: Soft, flat, nontender, nondistended. Has good bowel sounds. No hepatosplenomegaly appreciated.  EXTREMITIES: No evidence of any cyanosis, clubbing, or peripheral edema.  +2 pedal and radial pulses  bilaterally.  NEUROLOGIC: Limited.  SKIN: Moist and warm with no rashes appreciated.  Psych: Not anxious, depressed LN: No inguinal LN enlargement    Antibiotics   Anti-infectives (From admission, onward)   Start     Dose/Rate Route Frequency Ordered Stop   12/31/18 1600  ceFEPIme (MAXIPIME) 2 g in sodium chloride 0.9 % 100 mL IVPB  Status:  Discontinued     2 g 200 mL/hr over 30 Minutes Intravenous Every 24 hours 12/14/2018 1858 12/31/18 1158   12/31/18 1207  Ampicillin-Sulbactam (UNASYN) 3 g in sodium chloride 0.9 % 100 mL IVPB  3 g 200 mL/hr over 30 Minutes Intravenous Every 12 hours 12/31/18 1207     12/31/18 1200  Ampicillin-Sulbactam (UNASYN) 3 g in sodium chloride 0.9 % 100 mL IVPB  Status:  Discontinued     3 g 200 mL/hr over 30 Minutes Intravenous Every 6 hours 12/31/18 1158 12/31/18 1207   12/21/2018 1849  vancomycin variable dose per unstable renal function (pharmacist dosing)  Status:  Discontinued      Does not apply See admin instructions 12/27/2018 1858 12/31/18 1158   12/28/2018 1800  vancomycin (VANCOCIN) 500 mg in sodium chloride 0.9 % 100 mL IVPB     500 mg 100 mL/hr over 60 Minutes Intravenous  Once 12/24/2018 1628 12/28/2018 1941   12/24/2018 1630  ceFEPIme (MAXIPIME) 2 g in sodium chloride 0.9 % 100 mL IVPB     2 g 200 mL/hr over 30 Minutes Intravenous  Once 12/24/2018 1617 12/29/2018 1701   01/07/2019 1630  vancomycin (VANCOCIN) IVPB 1000 mg/200 mL premix     1,000 mg 200 mL/hr over 60 Minutes Intravenous  Once 01/06/2019 1617 12/17/2018 1838      Medications   Scheduled Meds: . carbidopa-levodopa  1 tablet Oral TID  . heparin  5,000 Units Subcutaneous Q8H  . hydrocerin   Topical BID  . polyvinyl alcohol  1 drop Both Eyes Q4H  . rotigotine  1 patch Transdermal Daily   Continuous Infusions: . sodium chloride 75 mL/hr at 01/03/19 1051  . ampicillin-sulbactam (UNASYN) IV 3 g (01/03/19 0042)   PRN Meds:.acetaminophen **OR** acetaminophen, nitroGLYCERIN, ondansetron **OR**  ondansetron (ZOFRAN) IV   Data Review:   Micro Results Recent Results (from the past 240 hour(s))  Blood Culture (routine x 2)     Status: None (Preliminary result)   Collection Time: 12/28/2018  4:18 PM   Specimen: BLOOD  Result Value Ref Range Status   Specimen Description BLOOD LEFT ANTECUBITAL  Final   Special Requests   Final    BOTTLES DRAWN AEROBIC AND ANAEROBIC Blood Culture adequate volume   Culture   Final    NO GROWTH 4 DAYS Performed at [facility], [address] [place], [place] [address]    Report Status PENDING  Incomplete  Blood Culture (routine x 2)     Status: None (Preliminary result)   Collection Time: 12/27/2018  4:23 PM   Specimen: BLOOD  Result Value Ref Range Status   Specimen Description BLOOD BLOOD RIGHT FOREARM  Final   Special Requests   Final    BOTTLES DRAWN AEROBIC AND ANAEROBIC Blood Culture adequate volume   Culture   Final    NO GROWTH 4 DAYS Performed at [facility], [address] [place], [place] [address]    Report Status PENDING  Incomplete  SARS CORONAVIRUS 2 (TAT 6-24 HRS) Nasopharyngeal Nasopharyngeal Swab     Status: None   Collection Time: 12/15/2018  4:45 PM   Specimen: Nasopharyngeal Swab  Result Value Ref Range Status   SARS Coronavirus 2 NEGATIVE NEGATIVE Final    Comment: (NOTE) SARS-CoV-2 target nucleic acids are NOT DETECTED. The SARS-CoV-2 RNA is generally detectable in upper and lower respiratory specimens during the acute phase of infection. Negative results do not preclude SARS-CoV-2 infection, do not rule out co-infections with other pathogens, and should not be used as the sole basis for treatment or other patient management decisions. Negative results must be combined with clinical observations, patient history, and epidemiological information. The expected result is Negative. Fact Sheet for Patients:  HairSlick.no Fact Sheet for Healthcare  Providers: quierodirigir.com This test is not yet approved or cleared by the [place] FDA and  has been authorized for detection and/or diagnosis of SARS-CoV-2 by FDA under an Emergency Use Authorization (EUA). This EUA will remain  in effect (meaning this test can be used) for the duration of the COVID-19 declaration under Section 56 4(b)(1) of the Act, 21 U.S.C. section 360bbb-3(b)(1), unless the authorization is terminated or revoked sooner. Performed at [facility], [address] [place], [place] [address]   MRSA PCR Screening     Status: None   Collection Time: 12/31/18  8:42 AM   Specimen: Nasal Mucosa; Nasopharyngeal  Result Value Ref Range Status   MRSA by PCR NEGATIVE NEGATIVE Final    Comment:        The GeneXpert MRSA Assay (FDA approved for NASAL specimens only), is one component of a comprehensive MRSA colonization surveillance program. It is not intended to diagnose MRSA infection nor to guide or monitor treatment for MRSA infections. Performed at [facility], [address], [place], [place] [address]     Radiology Reports Ct Head Wo Contrast  Result Date: 01/02/2019 CLINICAL DATA:  Altered level of consciousness and weakness EXAM: CT HEAD WITHOUT CONTRAST TECHNIQUE: Contiguous axial images were obtained from the base of the skull through the vertex without intravenous contrast. COMPARISON:  08/24/2017 FINDINGS: Brain: Considerable motion artifact is identified with severe limitations on the exam. There is persistent increased density along the anterior cranial fossa particularly on the left similar to that seen on the prior exam. Considerable beam hardening artifact is identified limiting the examination although the persistent density is suspicious for underlying meningioma. If the patient can undergo MRI examination that would be helpful. Diffuse atrophic changes are seen. Chronic white matter ischemic changes are  noted. No findings to suggest acute hemorrhage or acute infarction are seen. Vascular: No hyperdense vessel or unexpected calcification. Skull: Normal. Negative for fracture or focal lesion. Sinuses/Orbits: No acute finding. Other: Stable fluid within the mastoid air cells on the left. IMPRESSION: Chronic atrophic and ischemic changes stable from previous exam. Persistent increased density is noted along the base of the anterior cranial fossa particularly on the left. Although this is likely related to underlying beam hardening artifact the possibility of a meningioma could not be totally excluded. MRI would be helpful in this regard. Electronically Signed   By: Alcide Clever M.D.   On: 01/02/2019 10:54   Dg Chest Portable 1 View  Result Date: 12/26/2018 CLINICAL DATA:  Chest pain, shortness of breath. EXAM: PORTABLE CHEST 1 VIEW COMPARISON:  None. FINDINGS: The heart size and mediastinal contours are within normal limits. Right lung is clear. No pneumothorax is noted. Mild left basilar atelectasis is noted with probable small left pleural effusion. The visualized skeletal structures are unremarkable. IMPRESSION: Mild left basilar subsegmental atelectasis with probable small left pleural effusion. Electronically Signed   By: Lupita Raider M.D.   On: 12/16/2018 15:52     CBC Recent Labs  Lab 12/17/2018 1517 12/31/18 0446 01/01/19 0531  WBC 5.1 6.9 6.6  HGB 15.4 13.8 13.7  HCT 45.9 41.0 40.5  PLT 231 168 153  MCV 94.8 92.8 92.9  MCH 31.8 31.2 31.4  MCHC 33.6 33.7 33.8  RDW 12.9 13.1 13.2    Chemistries  Recent Labs  Lab 01/07/2019 1517 12/31/18 0446 01/01/19 0531  NA 141 143 145  K 4.9 5.0 4.2  CL 108  114* 116*  CO2 18* 17* 20*  GLUCOSE 196* 102* 118*  BUN 40* 57* 74*  CREATININE 1.96* 1.89* 2.13*  CALCIUM 9.5 8.3* 8.0*   ------------------------------------------------------------------------------------------------------------------ estimated creatinine clearance is 25 mL/min (A)  (by C-G formula based on SCr of 2.13 mg/dL (H)). ------------------------------------------------------------------------------------------------------------------ No results for input(s): HGBA1C in the last 72 hours. ------------------------------------------------------------------------------------------------------------------ No results for input(s): CHOL, HDL, LDLCALC, TRIG, CHOLHDL, LDLDIRECT in the last 72 hours. ------------------------------------------------------------------------------------------------------------------ No results for input(s): TSH, T4TOTAL, T3FREE, THYROIDAB in the last 72 hours.  Invalid input(s): FREET3 ------------------------------------------------------------------------------------------------------------------ No results for input(s): VITAMINB12, FOLATE, FERRITIN, TIBC, IRON, RETICCTPCT in the last 72 hours.  Coagulation profile No results for input(s): INR, PROTIME in the last 168 hours.  No results for input(s): DDIMER in the last 72 hours.  Cardiac Enzymes No results for input(s): CKMB, TROPONINI, MYOGLOBIN in the last 168 hours.  Invalid input(s): CK ------------------------------------------------------------------------------------------------------------------ Invalid input(s): POCBNP    Assessment & Plan    78 yr old male patient with a known history of parkinsons disease, BPH , hyperlipidemia presents to ER with generalized weakness and difficulty breathing. Patient also complaints of chest discomfort on deep breathing..  -Sepsis Continue antibiotics repeat procalcitonin level still shows elevated levels   -Left lung early pneumonia-this is due to aspiration Continue Unasyn  -Atypical chest pain reproducible in nature Could be from pneumonia Mildly elevated troponins from demand ischemia no further chest pain No further complaint of chest pains   -Dehydration IV fluids  -Parkinson's with worsening has been seen by  neurology Discussed with the son he is not accepting the fact that his father is getting worse.  I have told him unfortunately Parkinson's with advanced features we will not be able to reverse his current condition palliative care team has also spoken to patient's son.  -Dysphagia patient has severe dysphagia is pocketing his food.  CT scan of the head shows no evidence of stroke I suspect patient has worsening of his Parkinson's leading to the situation.  Unfortunately son is not willing to accept the fact that his father is getting worse  -Patient needs continued stay in the hospital due to the fact that he is continue to have dysphagia which is severe he is not able to take much oral intake.   -DVT prophylaxis with Mark heparin      Code Status Orders  (From admission, onward)         Start     Ordered   01/01/2019 2021  Full code  Continuous     12/23/2018 2020        Code Status History    This patient has a current code status but no historical code status.   Advance Care Planning Activity    Advance Directive Documentation     Most Recent Value  Type of Advance Directive  Living will  Pre-existing out of facility DNR order (yellow form or pink MOST form)  -  "MOST" Form in Place?  -           Consults none  DVT Prophylaxis  Lovenox   Lab Results  Component Value Date   PLT 153 01/01/2019     Time Spent in minutes 35 minutes  Greater than 50% of time spent in care coordination and counseling patient regarding the condition and plan of care.   Auburn Bilberry M.D on 01/03/2019 at 1:45 PM  Between 7am to 6pm - Pager - [phone]  After 6pm go to [email or web address] - password [email or web address]  [employer]  [phone]

## 2019-01-04 DIAGNOSIS — G20 Parkinson's disease: Secondary | ICD-10-CM

## 2019-01-04 LAB — CULTURE, BLOOD (ROUTINE X 2)
Culture: NO GROWTH
Culture: NO GROWTH
Special Requests: ADEQUATE
Special Requests: ADEQUATE

## 2019-01-04 MED ORDER — CARBIDOPA-LEVODOPA 25-100 MG PO TABS
2.0000 | ORAL_TABLET | Freq: Three times a day (TID) | ORAL | Status: DC
  Administered 2019-01-04 (×2): 2 via ORAL
  Filled 2019-01-04 (×3): qty 2

## 2019-01-04 NOTE — TOC Progression Note (Signed)
Transition of Care [facility]) - Progression Note    Patient Details  Name: Alan Peterson MRN: [id number] Date of Birth: [birth year]  [facility] CM/SW Contact  Latanya Maudlin, RN Phone Number: 01/04/2019, 1:08 PM  Clinical Narrative:  [facility] team spoke with patients son regarding bed offers. Son is familiar with [name] because he lives closely. However, he wishes to wait for all facilities to respond to bed request and choose the highest rating. Per his request we will extend search out to [place]. Once we have all known offers he wishes to know them and know their star ratings so he can choose.     Expected Discharge Plan: [place] Barriers to Discharge: Continued Medical Work up  Expected Discharge Plan and Services Expected Discharge Plan: [place]   Discharge Planning Services: CM Consult   Living arrangements for the past 2 months: Single Family Home                           HH Arranged: Speech Therapy [place]: [place] Date [place]: 12/31/18 Time [place]: 1157 Representative spoke with at [place]: [place] ([place]) Interventions    Readmission Risk Interventions No flowsheet data found.

## 2019-01-04 NOTE — Progress Notes (Signed)
Subjective: Patient alert this morning.   Responsive.  Following commands.    Objective: Current vital signs: BP 112/62 (BP Location: Left Arm)   Pulse 78   Temp 98.4 F (36.9 C) (Oral)   Resp 16   Ht 5\' 6"  (1.676 m)   Wt 73.8 kg   SpO2 96%   BMI 26.24 kg/m  Vital signs in last 24 hours: Temp:  [98.4 F (36.9 C)-99.6 F (37.6 C)] 98.4 F (36.9 C) (10/24 0741) Pulse Rate:  [62-141] 78 (10/24 0741) Resp:  [16-19] 16 (10/24 0741) BP: (112-120)/(51-62) 112/62 (10/24 0741) SpO2:  [95 %-98 %] 96 % (10/24 0741) Weight:  [73.8 kg] 73.8 kg (10/24 0350)  Intake/Output from previous day: No intake/output data recorded. Intake/Output this shift: No intake/output data recorded. Nutritional status:  Diet Order            DIET - DYS 1 Room service appropriate? Yes; Fluid consistency: Thin  Diet effective now              Neurologic Exam: Mental Status: Alert. Speech dysarthric and minimal. Follows commands.  Cranial Nerves: II: Visual fields grossly normal III,IV, VI: ptosis not present,decreased left eye lateral gaze V,VII:facial droop, facial light touch sensation normal bilaterally VIII: hearing normal bilaterally Motor: Increased tone throughout. Moves all extremities against gravity but weakly, throughout   Lab Results: Basic Metabolic Panel: Recent Labs  Lab 01/04/2019 1517 12/31/18 0446 01/01/19 0531  NA 141 143 145  K 4.9 5.0 4.2  CL 108 114* 116*  CO2 18* 17* 20*  GLUCOSE 196* 102* 118*  BUN 40* 57* 74*  CREATININE 1.96* 1.89* 2.13*  CALCIUM 9.5 8.3* 8.0*    Liver Function Tests: No results for input(s): AST, ALT, ALKPHOS, BILITOT, PROT, ALBUMIN in the last 168 hours. No results for input(s): LIPASE, AMYLASE in the last 168 hours. No results for input(s): AMMONIA in the last 168 hours.  CBC: Recent Labs  Lab 12/31/2018 1517 12/31/18 0446 01/01/19 0531  WBC 5.1 6.9 6.6  HGB 15.4 13.8 13.7  HCT 45.9 41.0 40.5  MCV 94.8 92.8 92.9  PLT 231  168 153    Cardiac Enzymes: No results for input(s): CKTOTAL, CKMB, CKMBINDEX, TROPONINI in the last 168 hours.  Lipid Panel: No results for input(s): CHOL, TRIG, HDL, CHOLHDL, VLDL, LDLCALC in the last 168 hours.  CBG: No results for input(s): GLUCAP in the last 168 hours.  Microbiology: Results for orders placed or performed during the hospital encounter of 01/02/2019  Blood Culture (routine x 2)     Status: None   Collection Time: 12/23/2018  4:18 PM   Specimen: BLOOD  Result Value Ref Range Status   Specimen Description BLOOD LEFT ANTECUBITAL  Final   Special Requests   Final    BOTTLES DRAWN AEROBIC AND ANAEROBIC Blood Culture adequate volume   Culture   Final    NO GROWTH 5 DAYS Performed at [facility], [address], [place], [place]    Report Status 01/04/2019 FINAL  Final  Blood Culture (routine x 2)     Status: None   Collection Time: 01/07/2019  4:23 PM   Specimen: BLOOD  Result Value Ref Range Status   Specimen Description BLOOD BLOOD RIGHT FOREARM  Final   Special Requests   Final    BOTTLES DRAWN AEROBIC AND ANAEROBIC Blood Culture adequate volume   Culture   Final    NO GROWTH 5 DAYS Performed at [facility], [address] [place] [address]  [address], [place], [place] [address]    Report Status 01/04/2019 FINAL  Final  SARS CORONAVIRUS 2 (TAT 6-24 HRS) Nasopharyngeal Nasopharyngeal Swab     Status: None   Collection Time: 12/13/2018  4:45 PM   Specimen: Nasopharyngeal Swab  Result Value Ref Range Status   SARS Coronavirus 2 NEGATIVE NEGATIVE Final    Comment: (NOTE) SARS-CoV-2 target nucleic acids are NOT DETECTED. The SARS-CoV-2 RNA is generally detectable in upper and lower respiratory specimens during the acute phase of infection. Negative results do not preclude SARS-CoV-2 infection, do not rule out co-infections with other pathogens, and should not be used as the sole basis for treatment or other patient management decisions. Negative results must  be combined with clinical observations, patient history, and epidemiological information. The expected result is Negative. Fact Sheet for Patients: SugarRoll.be Fact Sheet for Healthcare Providers: https://www.woods-mathews.com/ This test is not yet approved or cleared by the [place] FDA and  has been authorized for detection and/or diagnosis of SARS-CoV-2 by FDA under an Emergency Use Authorization (EUA). This EUA will remain  in effect (meaning this test can be used) for the duration of the COVID-19 declaration under Section 56 4(b)(1) of the Act, 21 U.S.C. section 360bbb-3(b)(1), unless the authorization is terminated or revoked sooner. Performed at [facility], [place] [address], [place], [place] [address]   MRSA PCR Screening     Status: None   Collection Time: 12/31/18  8:42 AM   Specimen: Nasal Mucosa; Nasopharyngeal  Result Value Ref Range Status   MRSA by PCR NEGATIVE NEGATIVE Final    Comment:        The GeneXpert MRSA Assay (FDA approved for NASAL specimens only), is one component of a comprehensive MRSA colonization surveillance program. It is not intended to diagnose MRSA infection nor to guide or monitor treatment for MRSA infections. Performed at [facility], [address] [place], [place] [address]     Coagulation Studies: No results for input(s): LABPROT, INR in the last 72 hours.  Imaging: Ct Head Wo Contrast  Result Date: 01/02/2019 CLINICAL DATA:  Altered level of consciousness and weakness EXAM: CT HEAD WITHOUT CONTRAST TECHNIQUE: Contiguous axial images were obtained from the base of the skull through the vertex without intravenous contrast. COMPARISON:  08/24/2017 FINDINGS: Brain: Considerable motion artifact is identified with severe limitations on the exam. There is persistent increased density along the anterior cranial fossa particularly on the left similar to that seen on the prior  exam. Considerable beam hardening artifact is identified limiting the examination although the persistent density is suspicious for underlying meningioma. If the patient can undergo MRI examination that would be helpful. Diffuse atrophic changes are seen. Chronic white matter ischemic changes are noted. No findings to suggest acute hemorrhage or acute infarction are seen. Vascular: No hyperdense vessel or unexpected calcification. Skull: Normal. Negative for fracture or focal lesion. Sinuses/Orbits: No acute finding. Other: Stable fluid within the mastoid air cells on the left. IMPRESSION: Chronic atrophic and ischemic changes stable from previous exam. Persistent increased density is noted along the base of the anterior cranial fossa particularly on the left. Although this is likely related to underlying beam hardening artifact the possibility of a meningioma could not be totally excluded. MRI would be helpful in this regard. Electronically Signed   By: Inez Catalina M.D.   On: 01/02/2019 10:54    Medications:  I have reviewed the patient's current medications. Scheduled: . carbidopa-levodopa  1 tablet Oral TID  .  heparin  5,000 Units Subcutaneous Q8H  . hydrocerin   Topical BID  . polyvinyl alcohol  1 drop Both Eyes Q4H  . rotigotine  1 patch Transdermal Daily    Assessment/Plan: Patient alert and awake.  Follows commands.  Started on Neupro on yesterday.  Patient was able to take Sinemet on yesterday as well.  Continued increased tone.    Recommendations: 1. Increase Sinemet to 50/200 TID 2. Continue Neupro patch   LOS: 5 days   Thana FarrLeslie Jilliann Subramanian, MD Neurology [phone] 01/04/2019  9:49 AM

## 2019-01-04 NOTE — Progress Notes (Signed)
[facility] [place] [facility]                                                                                                                                                                                  Patient Demographics   Alan Peterson, is a 78 y.o. male, DOB - 02-12-1941, DGU:[id number]  Admit date - 12/29/2018   Admitting Physician Ihor Austin, MD  Outpatient Primary MD for the patient is Karie Schwalbe, MD   LOS - 5  Subjective: Patient able to swallow better   Review of Systems:   CONSTITUTIONAL: Limited due to patient's Parkinson's and ability to communicate  Vitals:   Vitals:   01/03/19 1938 01/03/19 2000 01/04/19 0350 01/04/19 0741  BP: (!) 115/51  (!) 117/55 112/62  Pulse: (!) 141 71 84 78  Resp:    16  Temp: 99.2 F (37.3 C)  99.6 F (37.6 C) 98.4 F (36.9 C)  TempSrc: Oral  Oral Oral  SpO2: 96%  95% 96%  Weight:   73.8 kg   Height:        Wt Readings from Last 3 Encounters:  01/04/19 73.8 kg  12/07/17 70.8 kg  06/04/17 73.5 kg     Intake/Output Summary (Last 24 hours) at 01/04/2019 1314 Last data filed at 01/04/2019 0149 Gross per 24 hour  Intake -  Output 0 ml  Net 0 ml    Physical Exam:   GENERAL: Chronically ill-appearing male with head slumped down HEAD, EYES, EARS, NOSE AND THROAT: Atraumatic, normocephalic. Extraocular muscles are intact. Pupils equal and reactive to light. Sclerae anicteric. No conjunctival injection. No oro-pharyngeal erythema.  NECK: Supple. There is no jugular venous distention. No bruits, no lymphadenopathy, no thyromegaly.  HEART: Regular rate and rhythm,. No murmurs, no rubs, no clicks.  LUNGS: Clear to auscultation bilaterally. No rales or rhonchi. No wheezes.  ABDOMEN: Soft, flat, nontender, nondistended. Has good bowel sounds. No hepatosplenomegaly appreciated.  EXTREMITIES: No evidence of any cyanosis, clubbing, or peripheral edema.  +2 pedal and radial pulses bilaterally.   NEUROLOGIC: Limited.  SKIN: Moist and warm with no rashes appreciated.  Psych: Not anxious, depressed LN: No inguinal LN enlargement    Antibiotics   Anti-infectives (From admission, onward)   Start     Dose/Rate Route Frequency Ordered Stop   12/31/18 1600  ceFEPIme (MAXIPIME) 2 g in sodium chloride 0.9 % 100 mL IVPB  Status:  Discontinued     2 g 200 mL/hr over 30 Minutes Intravenous Every 24 hours 01/07/2019 1858 12/31/18 1158   12/31/18 1207  Ampicillin-Sulbactam (UNASYN) 3 g in sodium chloride 0.9 % 100 mL IVPB  3 g 200 mL/hr over 30 Minutes Intravenous Every 12 hours 12/31/18 1207     12/31/18 1200  Ampicillin-Sulbactam (UNASYN) 3 g in sodium chloride 0.9 % 100 mL IVPB  Status:  Discontinued     3 g 200 mL/hr over 30 Minutes Intravenous Every 6 hours 12/31/18 1158 12/31/18 1207   01/04/2019 1849  vancomycin variable dose per unstable renal function (pharmacist dosing)  Status:  Discontinued      Does not apply See admin instructions 12/28/2018 1858 12/31/18 1158   12/23/2018 1800  vancomycin (VANCOCIN) 500 mg in sodium chloride 0.9 % 100 mL IVPB     500 mg 100 mL/hr over 60 Minutes Intravenous  Once 12/18/2018 1628 01/11/2019 1941   12/24/2018 1630  ceFEPIme (MAXIPIME) 2 g in sodium chloride 0.9 % 100 mL IVPB     2 g 200 mL/hr over 30 Minutes Intravenous  Once 12/17/2018 1617 01/07/2019 1701   12/21/2018 1630  vancomycin (VANCOCIN) IVPB 1000 mg/200 mL premix     1,000 mg 200 mL/hr over 60 Minutes Intravenous  Once 01/02/2019 1617 01/01/2019 1838      Medications   Scheduled Meds: . carbidopa-levodopa  2 tablet Oral TID  . heparin  5,000 Units Subcutaneous Q8H  . hydrocerin   Topical BID  . polyvinyl alcohol  1 drop Both Eyes Q4H  . rotigotine  1 patch Transdermal Daily   Continuous Infusions: . ampicillin-sulbactam (UNASYN) IV 3 g (01/03/19 2329)   PRN Meds:.acetaminophen **OR** acetaminophen, nitroGLYCERIN, ondansetron **OR** ondansetron (ZOFRAN) IV   Data Review:   Micro  Results Recent Results (from the past 240 hour(s))  Blood Culture (routine x 2)     Status: None   Collection Time: 12/17/2018  4:18 PM   Specimen: BLOOD  Result Value Ref Range Status   Specimen Description BLOOD LEFT ANTECUBITAL  Final   Special Requests   Final    BOTTLES DRAWN AEROBIC AND ANAEROBIC Blood Culture adequate volume   Culture   Final    NO GROWTH 5 DAYS Performed at [facility], [address] [place], [place] [address]    Report Status 01/04/2019 FINAL  Final  Blood Culture (routine x 2)     Status: None   Collection Time: 12/25/2018  4:23 PM   Specimen: BLOOD  Result Value Ref Range Status   Specimen Description BLOOD BLOOD RIGHT FOREARM  Final   Special Requests   Final    BOTTLES DRAWN AEROBIC AND ANAEROBIC Blood Culture adequate volume   Culture   Final    NO GROWTH 5 DAYS Performed at [facility], [address] [place], [place] [address]    Report Status 01/04/2019 FINAL  Final  SARS CORONAVIRUS 2 (TAT 6-24 HRS) Nasopharyngeal Nasopharyngeal Swab     Status: None   Collection Time: 01/07/2019  4:45 PM   Specimen: Nasopharyngeal Swab  Result Value Ref Range Status   SARS Coronavirus 2 NEGATIVE NEGATIVE Final    Comment: (NOTE) SARS-CoV-2 target nucleic acids are NOT DETECTED. The SARS-CoV-2 RNA is generally detectable in upper and lower respiratory specimens during the acute phase of infection. Negative results do not preclude SARS-CoV-2 infection, do not rule out co-infections with other pathogens, and should not be used as the sole basis for treatment or other patient management decisions. Negative results must be combined with clinical observations, patient history, and epidemiological information. The expected result is Negative. Fact Sheet for Patients: HairSlick.nohttps://www.fda.gov/media/138098/download Fact Sheet for Healthcare Providers: quierodirigir.comhttps://www.fda.gov/media/138095/download This test is not  yet approved or cleared by the [place] and  has been authorized for detection and/or diagnosis of SARS-CoV-2 by FDA under an Emergency Use Authorization (EUA). This EUA will remain  in effect (meaning this test can be used) for the duration of the COVID-19 declaration under Section 56 4(b)(1) of the Act, 21 U.S.C. section 360bbb-3(b)(1), unless the authorization is terminated or revoked sooner. Performed at [facility], [address], [place], [place] [address]   MRSA PCR Screening     Status: None   Collection Time: 12/31/18  8:42 AM   Specimen: Nasal Mucosa; Nasopharyngeal  Result Value Ref Range Status   MRSA by PCR NEGATIVE NEGATIVE Final    Comment:        The GeneXpert MRSA Assay (FDA approved for NASAL specimens only), is one component of a comprehensive MRSA colonization surveillance program. It is not intended to diagnose MRSA infection nor to guide or monitor treatment for MRSA infections. Performed at [facility], [address] [place], [place] [address]     Radiology Reports Ct Head Wo Contrast  Result Date: 01/02/2019 CLINICAL DATA:  Altered level of consciousness and weakness EXAM: CT HEAD WITHOUT CONTRAST TECHNIQUE: Contiguous axial images were obtained from the base of the skull through the vertex without intravenous contrast. COMPARISON:  08/24/2017 FINDINGS: Brain: Considerable motion artifact is identified with severe limitations on the exam. There is persistent increased density along the anterior cranial fossa particularly on the left similar to that seen on the prior exam. Considerable beam hardening artifact is identified limiting the examination although the persistent density is suspicious for underlying meningioma. If the patient can undergo MRI examination that would be helpful. Diffuse atrophic changes are seen. Chronic white matter ischemic changes are noted. No findings to suggest acute hemorrhage or acute infarction are seen. Vascular: No hyperdense vessel or  unexpected calcification. Skull: Normal. Negative for fracture or focal lesion. Sinuses/Orbits: No acute finding. Other: Stable fluid within the mastoid air cells on the left. IMPRESSION: Chronic atrophic and ischemic changes stable from previous exam. Persistent increased density is noted along the base of the anterior cranial fossa particularly on the left. Although this is likely related to underlying beam hardening artifact the possibility of a meningioma could not be totally excluded. MRI would be helpful in this regard. Electronically Signed   By: Alcide Clever M.D.   On: 01/02/2019 10:54   Dg Chest Portable 1 View  Result Date: 12/24/2018 CLINICAL DATA:  Chest pain, shortness of breath. EXAM: PORTABLE CHEST 1 VIEW COMPARISON:  None. FINDINGS: The heart size and mediastinal contours are within normal limits. Right lung is clear. No pneumothorax is noted. Mild left basilar atelectasis is noted with probable small left pleural effusion. The visualized skeletal structures are unremarkable. IMPRESSION: Mild left basilar subsegmental atelectasis with probable small left pleural effusion. Electronically Signed   By: Lupita Raider M.D.   On: 12/21/2018 15:52     CBC Recent Labs  Lab 12/23/2018 1517 12/31/18 0446 01/01/19 0531  WBC 5.1 6.9 6.6  HGB 15.4 13.8 13.7  HCT 45.9 41.0 40.5  PLT 231 168 153  MCV 94.8 92.8 92.9  MCH 31.8 31.2 31.4  MCHC 33.6 33.7 33.8  RDW 12.9 13.1 13.2    Chemistries  Recent Labs  Lab 01/10/2019 1517 12/31/18 0446 01/01/19 0531  NA 141 143 145  K 4.9 5.0 4.2  CL 108 114* 116*  CO2 18* 17* 20*  GLUCOSE 196* 102*  118*  BUN 40* 57* 74*  CREATININE 1.96* 1.89* 2.13*  CALCIUM 9.5 8.3* 8.0*   ------------------------------------------------------------------------------------------------------------------ estimated creatinine clearance is 25 mL/min (A) (by C-G formula based on SCr of 2.13 mg/dL  (H)). ------------------------------------------------------------------------------------------------------------------ No results for input(s): HGBA1C in the last 72 hours. ------------------------------------------------------------------------------------------------------------------ No results for input(s): CHOL, HDL, LDLCALC, TRIG, CHOLHDL, LDLDIRECT in the last 72 hours. ------------------------------------------------------------------------------------------------------------------ No results for input(s): TSH, T4TOTAL, T3FREE, THYROIDAB in the last 72 hours.  Invalid input(s): FREET3 ------------------------------------------------------------------------------------------------------------------ No results for input(s): VITAMINB12, FOLATE, FERRITIN, TIBC, IRON, RETICCTPCT in the last 72 hours.  Coagulation profile No results for input(s): INR, PROTIME in the last 168 hours.  No results for input(s): DDIMER in the last 72 hours.  Cardiac Enzymes No results for input(s): CKMB, TROPONINI, MYOGLOBIN in the last 168 hours.  Invalid input(s): CK ------------------------------------------------------------------------------------------------------------------ Invalid input(s): POCBNP    Assessment & Plan    78 yr old male patient with a known history of parkinsons disease, BPH , hyperlipidemia presents to ER with generalized weakness and difficulty breathing. Patient also complaints of chest discomfort on deep breathing..  -Sepsis Due to pneumonia continue Unasyn   -Left lung early pneumonia-this is due to aspiration Continue Unasyn  -Atypical chest pain reproducible in nature Could be from pneumonia Mildly elevated troponins from demand ischemia no further chest pain No further complaint of chest pains   -Dehydration IV fluids  -Parkinson's with worsening has been seen by neurology Discussed with the son he is not accepting the fact that his father is getting  worse.  Continue therapy as per neurology  -Dysphagia patient has severe dysphagia is pocketing his food.  CT scan of the head shows no evidence of stroke I suspect patient has worsening of his Parkinson's leading to the situation.  Unfortunately son is not willing to accept the fact that his father is getting worse  -Disposition await rehab placement  -DVT prophylaxis with [place] heparin      Code Status Orders  (From admission, onward)         Start     Ordered   01/07/2019 2021  Full code  Continuous     01/02/2019 2020        Code Status History    This patient has a current code status but no historical code status.   Advance Care Planning Activity    Advance Directive Documentation     Most Recent Value  Type of Advance Directive  Living will  Pre-existing out of facility DNR order (yellow form or pink MOST form)  -  "MOST" Form in Place?  -           Consults none  DVT Prophylaxis  Lovenox   Lab Results  Component Value Date   PLT 153 01/01/2019     Time Spent in minutes 35 minutes  Greater than 50% of time spent in care coordination and counseling patient regarding the condition and plan of care.   Dustin Flock M.D on 01/04/2019 at 1:14 PM  Between 7am to 6pm - Pager - [phone]  After 6pm go to [email or web address] - Proofreader  [employer]   Office  [phone]

## 2019-01-05 ENCOUNTER — Inpatient Hospital Stay: Admit: 2019-01-05 | Payer: Medicare Other

## 2019-01-05 ENCOUNTER — Encounter: Payer: Self-pay | Admitting: Anesthesiology

## 2019-01-05 ENCOUNTER — Inpatient Hospital Stay: Payer: Medicare Other

## 2019-01-05 ENCOUNTER — Encounter: Admission: EM | Disposition: E | Payer: Self-pay | Source: Home / Self Care | Attending: Internal Medicine

## 2019-01-05 ENCOUNTER — Inpatient Hospital Stay: Payer: Medicare Other | Admitting: Anesthesiology

## 2019-01-05 HISTORY — PX: LAPAROTOMY: SHX154

## 2019-01-05 LAB — COMPREHENSIVE METABOLIC PANEL
ALT: 6 U/L (ref 0–44)
AST: 90 U/L — ABNORMAL HIGH (ref 15–41)
Albumin: 2.1 g/dL — ABNORMAL LOW (ref 3.5–5.0)
Alkaline Phosphatase: 66 U/L (ref 38–126)
Anion gap: 14 (ref 5–15)
BUN: 107 mg/dL — ABNORMAL HIGH (ref 8–23)
CO2: 16 mmol/L — ABNORMAL LOW (ref 22–32)
Calcium: 8.4 mg/dL — ABNORMAL LOW (ref 8.9–10.3)
Chloride: 125 mmol/L — ABNORMAL HIGH (ref 98–111)
Creatinine, Ser: 3.31 mg/dL — ABNORMAL HIGH (ref 0.61–1.24)
GFR calc Af Amer: 19 mL/min — ABNORMAL LOW (ref >60)
GFR calc non Af Amer: 17 mL/min — ABNORMAL LOW (ref >60)
Glucose, Bld: 131 mg/dL — ABNORMAL HIGH (ref 70–99)
Potassium: 4 mmol/L (ref 3.5–5.1)
Sodium: 155 mmol/L — ABNORMAL HIGH (ref 135–145)
Total Bilirubin: 0.7 mg/dL (ref 0.3–1.2)
Total Protein: 5 g/dL — ABNORMAL LOW (ref 6.5–8.1)

## 2019-01-05 LAB — BLOOD GAS, ARTERIAL
Acid-base deficit: 15.9 mmol/L — ABNORMAL HIGH (ref 0.0–2.0)
Acid-base deficit: 11.9 mmol/L — ABNORMAL HIGH (ref 0.0–2.0)
Bicarbonate: 13.3 mmol/L — ABNORMAL LOW (ref 20.0–28.0)
Bicarbonate: 13.9 mmol/L — ABNORMAL LOW (ref 20.0–28.0)
FIO2: 100
FIO2: 80
MECHVT: 500 mL
MECHVT: 500 mL
Mechanical Rate: 18
Mechanical Rate: 18
O2 Saturation: 98.6 %
O2 Saturation: 95.3 %
PEEP: 5 cmH2O
PEEP: 5 cmH2O
Patient temperature: 37
Patient temperature: 37
pCO2 arterial: 43 mmHg (ref 32.0–48.0)
pCO2 arterial: 31 mmHg — ABNORMAL LOW (ref 32.0–48.0)
pH, Arterial: 7.1 — CL (ref 7.350–7.450)
pH, Arterial: 7.26 — ABNORMAL LOW (ref 7.350–7.450)
pO2, Arterial: 152 mmHg — ABNORMAL HIGH (ref 83.0–108.0)
pO2, Arterial: 89 mmHg (ref 83.0–108.0)

## 2019-01-05 LAB — CBC WITH DIFFERENTIAL/PLATELET
Abs Immature Granulocytes: 0.13 10*3/uL — ABNORMAL HIGH (ref 0.00–0.07)
Basophils Absolute: 0.1 10*3/uL (ref 0.0–0.1)
Basophils Relative: 1 %
Eosinophils Absolute: 0 10*3/uL (ref 0.0–0.5)
Eosinophils Relative: 0 %
HCT: 40.1 % (ref 39.0–52.0)
Hemoglobin: 12.8 g/dL — ABNORMAL LOW (ref 13.0–17.0)
Immature Granulocytes: 2 %
Lymphocytes Relative: 4 %
Lymphs Abs: 0.4 10*3/uL — ABNORMAL LOW (ref 0.7–4.0)
MCH: 30.8 pg (ref 26.0–34.0)
MCHC: 31.9 g/dL (ref 30.0–36.0)
MCV: 96.4 fL (ref 80.0–100.0)
Monocytes Absolute: 0.2 10*3/uL (ref 0.1–1.0)
Monocytes Relative: 2 %
Neutro Abs: 7.7 10*3/uL (ref 1.7–7.7)
Neutrophils Relative %: 91 %
Platelets: 203 10*3/uL (ref 150–400)
RBC: 4.16 MIL/uL — ABNORMAL LOW (ref 4.22–5.81)
RDW: 14.6 % (ref 11.5–15.5)
WBC Morphology: INCREASED
WBC: 8.3 10*3/uL (ref 4.0–10.5)
nRBC: 0 % (ref 0.0–0.2)

## 2019-01-05 LAB — PHOSPHORUS: Phosphorus: 5.7 mg/dL — ABNORMAL HIGH (ref 2.5–4.6)

## 2019-01-05 LAB — MRSA PCR SCREENING: MRSA by PCR: NEGATIVE

## 2019-01-05 LAB — GLUCOSE, CAPILLARY
Glucose-Capillary: 103 mg/dL — ABNORMAL HIGH (ref 70–99)
Glucose-Capillary: 118 mg/dL — ABNORMAL HIGH (ref 70–99)
Glucose-Capillary: 77 mg/dL (ref 70–99)
Glucose-Capillary: 62 mg/dL — ABNORMAL LOW (ref 70–99)
Glucose-Capillary: 228 mg/dL — ABNORMAL HIGH (ref 70–99)

## 2019-01-05 LAB — LACTIC ACID, PLASMA: Lactic Acid, Venous: 2.4 mmol/L (ref 0.5–1.9)

## 2019-01-05 LAB — TROPONIN I (HIGH SENSITIVITY): Troponin I (High Sensitivity): 133 ng/L (ref <18)

## 2019-01-05 LAB — MAGNESIUM: Magnesium: 2.7 mg/dL — ABNORMAL HIGH (ref 1.7–2.4)

## 2019-01-05 LAB — PROCALCITONIN: Procalcitonin: 16.12 ng/mL

## 2019-01-05 SURGERY — LAPAROTOMY, EXPLORATORY
Anesthesia: General

## 2019-01-05 MED ORDER — ROCURONIUM BROMIDE 50 MG/5ML IV SOLN
INTRAVENOUS | Status: AC
  Filled 2019-01-05: qty 1

## 2019-01-05 MED ORDER — CHLORHEXIDINE GLUCONATE CLOTH 2 % EX PADS: 6.0000 | MEDICATED_PAD | Freq: Every day | CUTANEOUS | Status: DC

## 2019-01-05 MED ORDER — HYDROCORTISONE NA SUCCINATE PF 250 MG IJ SOLR
200.0000 mg | Freq: Two times a day (BID) | INTRAMUSCULAR | Status: DC
  Administered 2019-01-05 – 2019-01-06 (×3): 200 mg via INTRAVENOUS
  Filled 2019-01-05 (×6): qty 200

## 2019-01-05 MED ORDER — SODIUM CHLORIDE 0.9 % IV SOLN
0.0000 ug/min | INTRAVENOUS | Status: DC
  Administered 2019-01-05: 300 ug/min via INTRAVENOUS
  Administered 2019-01-05 (×2): 200 ug/min via INTRAVENOUS
  Administered 2019-01-05: 400 ug/min via INTRAVENOUS
  Administered 2019-01-05: 200 ug/min via INTRAVENOUS
  Administered 2019-01-06 (×2): 220 ug/min via INTRAVENOUS
  Administered 2019-01-06: 200 ug/min via INTRAVENOUS
  Administered 2019-01-06: 220 ug/min via INTRAVENOUS
  Administered 2019-01-06: 200 ug/min via INTRAVENOUS
  Administered 2019-01-07: 210 ug/min via INTRAVENOUS
  Administered 2019-01-07 (×2): 200 ug/min via INTRAVENOUS
  Filled 2019-01-05 (×4): qty 40
  Filled 2019-01-05: qty 4
  Filled 2019-01-05: qty 40
  Filled 2019-01-05: qty 4
  Filled 2019-01-05 (×3): qty 40
  Filled 2019-01-05 (×3): qty 4
  Filled 2019-01-05: qty 40
  Filled 2019-01-05: qty 4
  Filled 2019-01-05 (×2): qty 40
  Filled 2019-01-05: qty 4

## 2019-01-05 MED ORDER — PHENYLEPHRINE HCL (PRESSORS) 10 MG/ML IV SOLN
INTRAVENOUS | Status: AC
  Filled 2019-01-05: qty 1

## 2019-01-05 MED ORDER — SODIUM CHLORIDE 0.9 % IV SOLN
INTRAVENOUS | Status: DC | PRN
  Administered 2019-01-05: 250 mL via INTRAVENOUS

## 2019-01-05 MED ORDER — SODIUM CHLORIDE 0.9 % IV SOLN: INTRAVENOUS | Status: DC | PRN

## 2019-01-05 MED ORDER — FENTANYL 2500MCG IN NS 250ML (10MCG/ML) PREMIX INFUSION: 0.0000 ug/h | INTRAVENOUS | Status: DC

## 2019-01-05 MED ORDER — FENTANYL CITRATE (PF) 100 MCG/2ML IJ SOLN
25.0000 ug | INTRAMUSCULAR | Status: DC | PRN
  Administered 2019-01-05: 50 ug via INTRAVENOUS
  Administered 2019-01-05: 100 ug via INTRAVENOUS
  Filled 2019-01-05 (×3): qty 2

## 2019-01-05 MED ORDER — NOREPINEPHRINE 16 MG/250ML-% IV SOLN
0.0000 ug/min | INTRAVENOUS | Status: DC
  Administered 2019-01-05: 40 ug/min via INTRAVENOUS
  Administered 2019-01-05: 10 ug/min via INTRAVENOUS
  Filled 2019-01-05 (×3): qty 250

## 2019-01-05 MED ORDER — SODIUM BICARBONATE 8.4 % IV SOLN
100.0000 meq | Freq: Once | INTRAVENOUS | Status: AC
  Administered 2019-01-05: 100 meq via INTRAVENOUS
  Filled 2019-01-05: qty 50

## 2019-01-05 MED ORDER — SODIUM CHLORIDE 0.9 % IV SOLN
200.0000 mg | Freq: Once | INTRAVENOUS | Status: AC
  Administered 2019-01-05: 200 mg via INTRAVENOUS
  Filled 2019-01-05: qty 200

## 2019-01-05 MED ORDER — PHENYLEPHRINE HCL (PRESSORS) 10 MG/ML IV SOLN
INTRAVENOUS | Status: DC | PRN
  Administered 2019-01-05 (×4): 100 ug via INTRAVENOUS
  Administered 2019-01-05: 200 ug via INTRAVENOUS

## 2019-01-05 MED ORDER — LACTATED RINGERS IV BOLUS
1000.0000 mL | Freq: Once | INTRAVENOUS | Status: AC
  Administered 2019-01-05: 1000 mL via INTRAVENOUS

## 2019-01-05 MED ORDER — MIDAZOLAM HCL 2 MG/2ML IJ SOLN: 1.0000 mg | INTRAMUSCULAR | Status: DC | PRN

## 2019-01-05 MED ORDER — PIPERACILLIN-TAZOBACTAM 3.375 G IVPB
3.3750 g | Freq: Two times a day (BID) | INTRAVENOUS | Status: DC
  Administered 2019-01-05 – 2019-01-08 (×7): 3.375 g via INTRAVENOUS
  Filled 2019-01-05 (×7): qty 50

## 2019-01-05 MED ORDER — NOREPINEPHRINE 4 MG/250ML-% IV SOLN
INTRAVENOUS | Status: AC
  Filled 2019-01-05: qty 250

## 2019-01-05 MED ORDER — ROCURONIUM BROMIDE 100 MG/10ML IV SOLN
INTRAVENOUS | Status: DC | PRN
  Administered 2019-01-05: 20 mg via INTRAVENOUS
  Administered 2019-01-05: 10 mg via INTRAVENOUS
  Administered 2019-01-05: 30 mg via INTRAVENOUS
  Administered 2019-01-05: 10 mg via INTRAVENOUS

## 2019-01-05 MED ORDER — LORAZEPAM 2 MG/ML IJ SOLN
2.0000 mg | Freq: Once | INTRAMUSCULAR | Status: AC
  Administered 2019-01-05: 2 mg via INTRAVENOUS

## 2019-01-05 MED ORDER — DOPAMINE-DEXTROSE 3.2-5 MG/ML-% IV SOLN
INTRAVENOUS | Status: AC
  Administered 2019-01-05: 800 mg via INTRAVENOUS
  Filled 2019-01-05: qty 250

## 2019-01-05 MED ORDER — NOREPINEPHRINE 4 MG/250ML-% IV SOLN
INTRAVENOUS | Status: AC
  Administered 2019-01-05: 5 ug/min via INTRAVENOUS
  Filled 2019-01-05: qty 250

## 2019-01-05 MED ORDER — SODIUM CHLORIDE 0.9 % IV SOLN
100.0000 mg | INTRAVENOUS | Status: DC
  Filled 2019-01-05: qty 100

## 2019-01-05 MED ORDER — DEXTROSE 50 % IV SOLN
INTRAVENOUS | Status: AC
  Administered 2019-01-05: 25 mL via INTRAVENOUS
  Filled 2019-01-05: qty 50

## 2019-01-05 MED ORDER — DEXTROSE 50 % IV SOLN
25.0000 mL | Freq: Once | INTRAVENOUS | Status: AC
  Administered 2019-01-05: 16:00:00 25 mL via INTRAVENOUS

## 2019-01-05 MED ORDER — ESMOLOL HCL 100 MG/10ML IV SOLN
INTRAVENOUS | Status: DC | PRN
  Administered 2019-01-05: 10 mg via INTRAVENOUS
  Administered 2019-01-05: 20 mg via INTRAVENOUS
  Administered 2019-01-05: 10 mg via INTRAVENOUS

## 2019-01-05 MED ORDER — SODIUM CHLORIDE 0.9 % IV SOLN
100.0000 mg | INTRAVENOUS | Status: DC
  Administered 2019-01-06 – 2019-01-08 (×2): 100 mg via INTRAVENOUS
  Filled 2019-01-05 (×4): qty 100

## 2019-01-05 MED ORDER — SODIUM CHLORIDE 0.9 % IV SOLN
0.0000 ug/min | INTRAVENOUS | Status: DC
  Administered 2019-01-05: 20 ug/min via INTRAVENOUS
  Filled 2019-01-05: qty 10

## 2019-01-05 MED ORDER — PROPOFOL 10 MG/ML IV BOLUS
INTRAVENOUS | Status: AC
  Filled 2019-01-05: qty 20

## 2019-01-05 MED ORDER — FAMOTIDINE IN NACL 20-0.9 MG/50ML-% IV SOLN
20.0000 mg | INTRAVENOUS | Status: DC
  Administered 2019-01-05 – 2019-01-08 (×4): 20 mg via INTRAVENOUS
  Filled 2019-01-05 (×4): qty 50

## 2019-01-05 MED ORDER — EPINEPHRINE HCL 5 MG/250ML IV SOLN IN NS
INTRAVENOUS | Status: AC
  Administered 2019-01-05: 10 ug/min via INTRAVENOUS
  Filled 2019-01-05: qty 250

## 2019-01-05 MED ORDER — SODIUM BICARBONATE 8.4 % IV SOLN
INTRAVENOUS | Status: AC
  Filled 2019-01-05: qty 50

## 2019-01-05 MED ORDER — CARBIDOPA-LEVODOPA 25-100 MG PO TABS
2.0000 | ORAL_TABLET | Freq: Three times a day (TID) | ORAL | Status: DC
  Filled 2019-01-05: qty 2

## 2019-01-05 MED ORDER — FENTANYL CITRATE (PF) 100 MCG/2ML IJ SOLN: 25.0000 ug | INTRAMUSCULAR | Status: DC | PRN

## 2019-01-05 MED ORDER — FENTANYL BOLUS VIA INFUSION
50.0000 ug | INTRAVENOUS | Status: DC | PRN
  Filled 2019-01-05: qty 100

## 2019-01-05 MED ORDER — FENTANYL CITRATE (PF) 100 MCG/2ML IJ SOLN
INTRAMUSCULAR | Status: AC
  Filled 2019-01-05: qty 2

## 2019-01-05 MED ORDER — CHLORHEXIDINE GLUCONATE 0.12% ORAL RINSE (MEDLINE KIT)
15.0000 mL | Freq: Two times a day (BID) | OROMUCOSAL | Status: DC
  Administered 2019-01-05 – 2019-01-08 (×6): 15 mL via OROMUCOSAL

## 2019-01-05 MED ORDER — LORAZEPAM 2 MG/ML IJ SOLN
INTRAMUSCULAR | Status: AC
  Filled 2019-01-05: qty 1

## 2019-01-05 MED ORDER — DEXTROSE 50 % IV SOLN
1.0000 | Freq: Once | INTRAVENOUS | Status: AC
  Administered 2019-01-06: 50 mL via INTRAVENOUS

## 2019-01-05 MED ORDER — ORAL CARE MOUTH RINSE
15.0000 mL | OROMUCOSAL | Status: DC
  Administered 2019-01-05 – 2019-01-08 (×34): 15 mL via OROMUCOSAL

## 2019-01-05 MED ORDER — STERILE WATER FOR INJECTION IV SOLN
INTRAVENOUS | Status: DC
  Administered 2019-01-05: 03:00:00 via INTRAVENOUS
  Filled 2019-01-05 (×2): qty 850

## 2019-01-05 MED ORDER — DEXTROSE 50 % IV SOLN
INTRAVENOUS | Status: AC
  Administered 2019-01-06: 50 mL via INTRAVENOUS
  Filled 2019-01-05: qty 50

## 2019-01-05 MED ORDER — FENTANYL CITRATE (PF) 100 MCG/2ML IJ SOLN
INTRAMUSCULAR | Status: DC | PRN
  Administered 2019-01-05 (×2): 50 ug via INTRAVENOUS

## 2019-01-05 MED ORDER — DEXTROSE 5 % IV SOLN
INTRAVENOUS | Status: DC
  Administered 2019-01-05: 100 mL/h via INTRAVENOUS
  Administered 2019-01-05 – 2019-01-06 (×3): via INTRAVENOUS

## 2019-01-05 MED ORDER — NOREPINEPHRINE 4 MG/250ML-% IV SOLN
0.0000 ug/min | INTRAVENOUS | Status: DC
  Administered 2019-01-05: 02:00:00 5 ug/min via INTRAVENOUS
  Filled 2019-01-05: qty 250

## 2019-01-05 MED ORDER — PIPERACILLIN-TAZOBACTAM 3.375 G IVPB: 3.3750 g | Freq: Three times a day (TID) | INTRAVENOUS | Status: DC

## 2019-01-05 MED ORDER — VASOPRESSIN 20 UNIT/ML IV SOLN: 0.0300 [IU]/min | INTRAVENOUS | Status: DC

## 2019-01-05 MED ORDER — VASOPRESSIN 20 UNIT/ML IV SOLN
0.0300 [IU]/min | INTRAVENOUS | Status: DC
  Administered 2019-01-05: 0.03 [IU]/min via INTRAVENOUS
  Filled 2019-01-05 (×4): qty 2

## 2019-01-05 MED ORDER — LACTATED RINGERS IV SOLN
INTRAVENOUS | Status: DC | PRN
  Administered 2019-01-05: 09:00:00 via INTRAVENOUS

## 2019-01-05 MED ORDER — EPINEPHRINE HCL 5 MG/250ML IV SOLN IN NS
0.5000 ug/min | INTRAVENOUS | Status: DC
  Administered 2019-01-05: 13:00:00 10 ug/min via INTRAVENOUS

## 2019-01-05 MED ORDER — MIDAZOLAM HCL 2 MG/2ML IJ SOLN
1.0000 mg | INTRAMUSCULAR | Status: AC | PRN
  Administered 2019-01-05 (×3): 1 mg via INTRAVENOUS
  Filled 2019-01-05 (×4): qty 2

## 2019-01-05 MED ORDER — DOPAMINE-DEXTROSE 3.2-5 MG/ML-% IV SOLN
0.0000 ug/kg/min | INTRAVENOUS | Status: DC
  Administered 2019-01-05: 12:00:00 800 mg via INTRAVENOUS
  Administered 2019-01-06: 2 ug/kg/min via INTRAVENOUS
  Filled 2019-01-05: qty 250

## 2019-01-05 SURGICAL SUPPLY — 39 items
APL PRP STRL LF DISP 70% ISPRP (MISCELLANEOUS) ×1
BULB RESERV EVAC DRAIN JP 100C (MISCELLANEOUS) ×2 IMPLANT
CANISTER SUCT 1200ML W/VALVE (MISCELLANEOUS) ×1 IMPLANT
CANISTER SUCT 3000ML PPV (MISCELLANEOUS) ×4 IMPLANT
CHLORAPREP W/TINT 26 (MISCELLANEOUS) ×3 IMPLANT
COVER WAND RF STERILE (DRAPES) ×3 IMPLANT
DRAIN CHANNEL JP 19F (MISCELLANEOUS) ×2 IMPLANT
DRAPE LAPAROTOMY 100X77 ABD (DRAPES) ×3 IMPLANT
DRSG OPSITE POSTOP 4X10 (GAUZE/BANDAGES/DRESSINGS) ×3 IMPLANT
DRSG OPSITE POSTOP 4X8 (GAUZE/BANDAGES/DRESSINGS) ×1 IMPLANT
DRSG TEGADERM 4X10 (GAUZE/BANDAGES/DRESSINGS) ×3 IMPLANT
DRSG TELFA 3X8 NADH (GAUZE/BANDAGES/DRESSINGS) IMPLANT
ELECT REM PT RETURN 9FT ADLT (ELECTROSURGICAL) ×3
ELECTRODE REM PT RTRN 9FT ADLT (ELECTROSURGICAL) ×1 IMPLANT
GLOVE BIO SURGEON STRL SZ 6.5 (GLOVE) ×5 IMPLANT
GLOVE BIO SURGEONS STRL SZ 6.5 (GLOVE) ×4
GLOVE BIOGEL PI IND STRL 6.5 (GLOVE) ×1 IMPLANT
GLOVE BIOGEL PI INDICATOR 6.5 (GLOVE) ×2
GOWN STRL REUS W/ TWL LRG LVL3 (GOWN DISPOSABLE) ×2 IMPLANT
GOWN STRL REUS W/TWL LRG LVL3 (GOWN DISPOSABLE) ×6
KIT TURNOVER KIT A (KITS) ×3 IMPLANT
LABEL OR SOLS (LABEL) ×3 IMPLANT
LIGASURE IMPACT 36 18CM CVD LR (INSTRUMENTS) ×2 IMPLANT
NS IRRIG 1000ML POUR BTL (IV SOLUTION) ×3 IMPLANT
PACK BASIN MAJOR ARMC (MISCELLANEOUS) ×3 IMPLANT
PACK COLON CLEAN CLOSURE (MISCELLANEOUS) ×3 IMPLANT
PAD DRESSING TELFA 3X8 NADH (GAUZE/BANDAGES/DRESSINGS) ×1 IMPLANT
RETRACTOR WND ALEXIS-O 25 LRG (MISCELLANEOUS) IMPLANT
RTRCTR WOUND ALEXIS O 25CM LRG (MISCELLANEOUS) ×3
SPONGE LAP 18X18 RF (DISPOSABLE) ×3 IMPLANT
SUT PDS AB 1 TP1 54 (SUTURE) ×6 IMPLANT
SUT SILK 2 0 (SUTURE) ×3
SUT SILK 2-0 18XBRD TIE 12 (SUTURE) ×1 IMPLANT
SUT SILK 3 0 (SUTURE) ×3
SUT SILK 3-0 18XBRD TIE 12 (SUTURE) ×1 IMPLANT
SUT VIC AB 3-0 SH 27 (SUTURE) ×6
SUT VIC AB 3-0 SH 27X BRD (SUTURE) ×2 IMPLANT
TRAY FOLEY MTR SLVR 16FR STAT (SET/KITS/TRAYS/PACK) ×1 IMPLANT
TUBE JEJUNO 22X14 (TUBING) ×2 IMPLANT

## 2019-01-05 NOTE — Consult Note (Signed)
Name: Alan Peterson MRN: [id number] DOB: [birth year]    ADMISSION DATE:  12/26/2018 CONSULTATION DATE: 01/04/2019  REFERRING MD : Dr. Sidney Ace   CHIEF COMPLAINT: Cardiac Arrest   BRIEF PATIENT DESCRIPTION:  78 yo male admitted to the medsurg unit 10/19 with sepsis secondary to pneumonia likely due to aspiration in the setting of dysphagia secondary progressing parkinson's disease. On 10/25 pt transferred to ICU post cardiac arrest mechanically intubated   SIGNIFICANT EVENTS/STUDIES:  10/19: Pt admitted to the medsurg unit with sepsis secondary to pneumonia and atypical chest pain  10/20: Speech therapy consulted due to signs of dysphagia and aspiration risk in setting of parkinson's disease recommended dysphagia level 1 (puree) w/nector consistency liquids 10/21: Palliative care consulted to discuss goals of treatment due to progressing parkinson's disease and worsening dysphagia. Pts son wanted pt to remain Full Code  10/21: Neurology consulted for parkinson disease medication management recommended MRI Brain, however unable to complete due to pts posture (severe kyphosis) 10/21: Per RN pt pocketing food and unable to swallow food or fluids safely, therefore diet order changed to NPO due to high aspiration risk.  Pt unable to take po sinemet  10/22: CT Head revealed chronic atrophic and ischemic changes stable from previous exam. Persistent increased density is noted along the base of the anterior cranial fossa particularly on the left. Although this is likely related to underlying beam hardening artifact the possibility of a meningioma could not be totally excluded.  10/23: Palliative care spoke with pts son regarding goals of treatment. Pts son wanted to continue aggressive care. Pt more alert and speech therapy reassessed pt swallowing recommended resuming dysphagia 1 with thin liquid by spoon since mentation improved   10/23: Per neurology neupro patch started  10/24: Pts mentation  continued to improve and pt able to take po sinemet  10/25: Pt unresponsive, bradycardic then asystole: Code Blue called and ACLS protocol initiated received 2 doses of epinephrine and 1 amp of calcium chloride with ROSC 20 minutes post initiation and pt required mechanical intubation.  Pt transferred to ICU for additional workup and treatment.   HISTORY OF PRESENT ILLNESS:   This is an 78 yo male with a PMH of Parkinson's Disease (last known neurology visit 2018), Hyperlipidemia, and BPH.  He presented to [facility] ER on 10/19 with shortness of breath, substernal chest pain with radiation to the back/upper abdomen, and weakness.  In the ER EKG revealed RBBB with no significant ischemic changes, troponin 34, and BNP 154.  Additional workup revealed sepsis secondary to pneumonia likely due to aspiration in the setting of progressing parkinson's disease. IV abxs initiated and pt admitted to the [facility] unit for additional workup and treatment.  Detailed hospital course listed above under significant events.   PAST MEDICAL HISTORY :   has a past medical history of BPH (benign prostatic hypertrophy), Hyperlipidemia, and Parkinson's disease.  has a past surgical history that includes Finger contracture release (1980). Prior to Admission medications   Medication Sig Start Date End Date Taking? Authorizing Provider  carbidopa-levodopa (SINEMET IR) 25-100 MG tablet TAKE 1 TABLET BY MOUTH THREE TIMES A DAY Patient taking differently: Take 1 tablet by mouth 3 (three) times daily.  08/21/18  Yes [name], MD  silver sulfADIAZINE (SILVADENE) 1 % cream APPLY TO AFFECTED AREA EVERY DAY Patient taking differently: Apply 1 application topically daily as needed (skin irritations).  10/22/18  Yes [name], MD  cephALEXin (KEFLEX) 500 MG capsule Take 1 capsule (500  mg total) by mouth 3 (three) times daily. Patient not taking: Reported on 12/31/2018 07/03/18   [name], MD   No Known Allergies   FAMILY HISTORY:  family history includes Hypertension in his mother. SOCIAL HISTORY:  reports that he has never smoked. He has never used smokeless tobacco. He reports previous alcohol use. He reports previous drug use.  REVIEW OF SYSTEMS:   Unable to assess pt intubated   SUBJECTIVE:  Unable to assess pt intubated   VITAL SIGNS: Temp:  [98.4 F (36.9 C)-99.6 F (37.6 C)] 98.6 F (37 C) (10/24 1940) Pulse Rate:  [78-84] 82 (10/24 1940) Resp:  [16-20] 16 (10/24 1940) BP: (104-117)/(50-62) 104/50 (10/24 1940) SpO2:  [95 %-97 %] 96 % (10/24 1940) Weight:  [73.8 kg] 73.8 kg (10/24 0350)  PHYSICAL EXAMINATION: General: chronically ill appearing male, NAD mechanically intubated  Neuro: sedated, not following commands, bilateral pupils 2 mm sluggish HEENT: no JVD, severe kyphosis  Cardiovascular: sinus rhythm with RBBB and occasional PVC's, no R/G Lungs: rhonchi and diminished throughout, even, non labored  Abdomen: hypoactive BS x4, obese, soft, non distended  Musculoskeletal: 2+ penile edema and trace bilateral edema  Skin: stage II pressure ulcer on buttocks   Recent Labs  Lab 12/28/2018 1517 12/31/18 0446 01/01/19 0531  NA 141 143 145  K 4.9 5.0 4.2  CL 108 114* 116*  CO2 18* 17* 20*  BUN 40* 57* 74*  CREATININE 1.96* 1.89* 2.13*  GLUCOSE 196* 102* 118*   Recent Labs  Lab 12/28/2018 1517 12/31/18 0446 01/01/19 0531  HGB 15.4 13.8 13.7  HCT 45.9 41.0 40.5  WBC 5.1 6.9 6.6  PLT 231 168 153   No results found.  ASSESSMENT / PLAN:  Acute hypoxic respiratory failure secondary to aspiration pneumonia and metabolic acidosis  Mechanical intubation post cardiac arrest  Full vent support for now-vent settings reviewed and established  SBT once all parameters met VAP bundle implemented  Prn bronchodilator therapy  Trend WBC and monitor fever curve  Follow cultures  Continue unasyn  Aspiration precautions   Cardiac arrest (asystole) etiology unknown Cardiogenic  shock  Hx: Hyperlipidemia  Continuous telemetry monitoring  Troponin and EKG pending  Prn levophed gtt to maintain map >65  Acute renal failure  Metabolic acidosis  Trend BMP  Replace electrolytes as indicated  Monitor UOP Avoid nephrotoxic medications  Continue sodium bicarb gtt @100  ml/hr   Acute encephalopathy  Mechanical intubation pain/discomfort  Progressing parkinson's disease  Maintain RASS goal 0 to -1  Prn fentanyl and versed to maintain RASS goal  WUA daily  Neurology consulted appreciate input  Continue sinemet and neupro patch    VTE px: subq heparin SUP px: iv pepcid   -Dr. Sidney Ace contacting pts son to inform him of decline in pt condition and transfer to ICU.   -Palliative care consulted will need to continue discussions regarding goals of treatment.  Due to progressing parkinson's disease recommend changing pts code status to DNR  Marda Stalker, [place] Pager [phone] (please enter 7 digits) PCCM Consult Pager [phone] (please enter 7 digits)

## 2019-01-05 NOTE — Procedures (Signed)
Central Venous Catheter Insertion Procedure Note Alan Peterson [id number] 10/25/1940  Procedure: Insertion of Central Venous Catheter Indications: Assessment of intravascular volume, Drug and/or fluid administration and Frequent blood sampling  Procedure Details Consent: Risks of procedure as well as the alternatives and risks of each were explained to the (patient/caregiver).  Consent for procedure obtained. Time Out: Verified patient identification, verified procedure, site/side was marked, verified correct patient position, special equipment/implants available, medications/allergies/relevent history reviewed, required imaging and test results available.  Performed  Maximum sterile technique was used including antiseptics, cap, gloves, gown, hand hygiene, mask and sheet. Skin prep: Chlorhexidine; local anesthetic administered A antimicrobial bonded/coated triple lumen catheter was placed in the left femoral vein due to multiple attempts, no other available access using the Seldinger technique.  Evaluation Blood flow good Complications: No apparent complications Patient did tolerate procedure well. Chest X-ray ordered to verify placement.  CXR: pending   Attempted to place left internal jugular CVL utilizing ultrasound, however unable to advance guidewire therefore successfully placed left femoral CVL utilizing ultrasound.  Prior to emergent insertion of central attempted to contact pts son Cervando Durnin via telephone to obtain consent and inform him of decline in pt condition, however he did not answer therefore I left a voicemail message instructing Mr. Minotti to return my phone call.  Marda Stalker, [place] Pager [phone] (please enter 7 digits) PCCM Consult Pager [phone] (please enter 7 digits)

## 2019-01-05 NOTE — Procedures (Signed)
Arterial Line Placement: Indication: Frequent blood draws; Invasive BP monitoring.   Consent: Emergent.   Hand washing performed prior to starting the procedure.   Procedure: An active timeout was performed and correct patient, name, & ID confirmed. Physicial exam was performed to ensure adequate perfusion.  Using sterile technique, an aterial line was inserted into the Right  Femoral artery.  Catheter threaded and the needle was removed with appropriate blood return.  Arterial waveform was noted.  After the procedure, the patient's extremities were observed to be pink and warm.   Estimated Blood Loss: None .   Number of Attempts: 1.   Complications: None .  Operator: Lanney Gins MD    Ottie Glazier, M.D.  [facility] [place]

## 2019-01-05 NOTE — Care Plan (Signed)
-  Patient s/p Surgery - s/p gastric ulcer repair -critically ill - remains on vasopressors, removed >2L bilious fluid from peritonieal cavity.  -S/p Open surgical Gastrostomy tube - do not use -until further notice per surgical team.    Ottie Glazier, M.D.  [facility] [place]

## 2019-01-05 NOTE — Progress Notes (Signed)
Patient dressing changed. Patient tolerated dressing changed well.

## 2019-01-05 NOTE — Progress Notes (Signed)
CODE BLUE note:  Date of note: 12/13/2018  The patient became bradycardic with heart rate of 20 around 1:30 AM and later on went rapidly into cardiac asystole.  CODE BLUE was called then and CPR was immediately started.  The patient was bagged.  He received 2 doses of epinephrine 1 mg IV, wide-open normal saline and 1 ampoule of calcium chloride with vigorous CPR.  He regained his pulse at 1:41 AM and was in sinus tachycardia with rate 145.  His blood pressure was 121/63.  He was then intubated via glidoscope by Dr. Kerman Passey and then was immediately transferred to the ICU.  I contacted the e- link intensivist regarding the patient and the case was discussed.  I also discussed the case with the ICU nurse practitioner Hinton Dyer.

## 2019-01-05 NOTE — Significant Event (Signed)
Notified by Radiologist CT Abd Pelvis revealed nasogastric tube tip extends beyond the margin of the wall of the gastric fundus and resides in the peritoneal cavity. Large volume of pneumoperitoneum and free fluid identified within the abdomen and pelvis. Findings are compatible with bowel perforation.  Therefore, I spoke with on call surgeon Dr. Peyton Najjar regarding abnormal CT Abd Pelvis findings and surgical consultation.  Dr. Peyton Najjar acknowledged consultation and will see the pt.   Marda Stalker, [place] Pager [phone] (please enter 7 digits) PCCM Consult Pager [phone] (please enter 7 digits)

## 2019-01-05 NOTE — Op Note (Signed)
Preoperative diagnosis: Acute abdomen and perforated viscus.   Postoperative diagnosis: Perforated pre pyloric ulcer.  Procedure: Repair of perforated gastric ulcer.                       Open gastrostomy  Anesthesia: GETA  Surgeon: Dr. Windell Moment, MD  Wound Classification: Contaminated  Indications: Patient is a 78 y.o. male developed acute abdomen with free air on computed tomography scan of abdomen.   Findings:  1. Large quantity of bilious and turbid fluid identified (2250 mL). 2. Pre pyloric 1 cm ulcer identified and able to be closed and patched.  3. Patient did not deteriorated during surgery 4. No other intra abdominal pathology identified.  5. Adequate hemostasis   Description of procedure: The patient was placed in the supine position and general endotracheal anesthesia was induced. A time-out was completed verifying correct patient, procedure, site, positioning, and implant(s) and/or special equipment prior to beginning this procedure. Preoperative antibiotics were given. Patient with NGT in place from ICU. Unable to place Foley catheter. The abdomen was prepped and draped in the usual sterile fashion. A vertical midline incision was made from xiphoid to just above the umbilicus. This was deepened through the subcutaneous tissues and hemostasis was achieved with electrocautery. The linea alba was identified and incised and the peritoneal cavity entered. The abdomen was explored. A large quantity of bilious and turbid fluid (2250 mL) was suctioned from the peritoneal cavity. The stomach and duodenum were inspected and palpated and a small anterior perforation of a pre pyloric ulcer was found.   Three interrupted sutures of 2-0 silk were used to approximate the ulcer. Then 3 others 3-0 sutures were placed through healthy gastric tissue in such a fashion as to span the perforation. These were left untied for now. A mobile portion of viable omentum was brought up, placed over the  perforation, and secured in place by tying the previously placed sutures over it in such a manner as to completely cover the hole in the duodenum. Care was taken to avoid excess tension.  The anterior wall near the greater curvature was selected. That site was approximated to the chosen exit site and found to reach without tension. A small incision was made and the 22-French Gastrostome was passed through the anterior abdominal wall and into the field.  A purse-string suture of 3-0 silk was placed on the anterior surface of the stomach and a gastrotomy was made with electrocautery in the center of the suture. The catheter was inserted into the lumen of the stomach. The purse-string suture was secured in place in such a manner as to inkwell the stomach around the catheter. A second, outer concentric pursestring was placed in a similar manner and tied to further inkwell the stomach.  The stomach was then tacked to the anterior abdominal wall at the catheter entrance site with several with the same silk sutures used for the purse-string in such a manner as to prevent leakage or torsion. The catheter was secured to the skin with 2-0 prolene.  Hemostasis was checked. The position of the nasogastric tube was verified. The abdomen was copiously lavaged with warm saline. The fascia was closed with a running suture of PDS 0. The skin was closed with skin staples.  The patient tolerated the procedure well and was taken to the postanesthesia care unit in stable condition.   Specimen: None  Complications: None  Estimated Blood Loss: 5 mL

## 2019-01-05 NOTE — Consult Note (Signed)
SURGICAL CONSULTATION NOTE   HISTORY OF PRESENT ILLNESS (HPI):  78 y.o. male transfer to ICU after he coded in the room. As per ICU staff, he aspirated and had respiratory failure that led to cardiorespiratory arrest. He had CPR for around 20 minutes and had return of vitals sings. CT scan of the abdomen was done and shows massive pneumoperitoneum with abundant fluid in the abdomen with NGT out of the stomach. Patient currently on vasopressors.  Patient admitted due to weakness and sepsis most likely due to pneumonia. This was suspected to be aspiration pneumonia. As per discussion with admitting team, patient was not improving appropriately. Multiple talk with son about patient clinical situation were not going well as reported that the son was not understanding patients deterioration.   I was called by ICU team Dr. Laural Roes with CT scan finding of massive pneumoperitoneum.   PAST MEDICAL HISTORY (PMH):  Past Medical History:  Diagnosis Date  . BPH (benign prostatic hypertrophy)   . Hyperlipidemia   . Parkinson's disease      PAST SURGICAL HISTORY ([place]):  Past Surgical History:  Procedure Laterality Date  . FINGER CONTRACTURE RELEASE  1980   right, left 4th finger release 8/10     MEDICATIONS:  Prior to Admission medications   Medication Sig Start Date End Date Taking? Authorizing Provider  carbidopa-levodopa (SINEMET IR) 25-100 MG tablet TAKE 1 TABLET BY MOUTH THREE TIMES A DAY Patient taking differently: Take 1 tablet by mouth 3 (three) times daily.  08/21/18  Yes [name], MD  silver sulfADIAZINE (SILVADENE) 1 % cream APPLY TO AFFECTED AREA EVERY DAY Patient taking differently: Apply 1 application topically daily as needed (skin irritations).  10/22/18  Yes [name], MD  cephALEXin (KEFLEX) 500 MG capsule Take 1 capsule (500 mg total) by mouth 3 (three) times daily. Patient not taking: Reported on 01/06/2019 07/03/18   [name], MD     ALLERGIES:  No  Known Allergies   SOCIAL HISTORY:  Social History   Socioeconomic History  . Marital status: Divorced    Spouse name: Not on file  . Number of children: 2  . Years of education: Not on file  . Highest education level: Not on file  Occupational History  . 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 Financial resource strain: Not on file  . Food insecurity    Worry: Not on file    Inability: Not on file  . Transportation needs    Medical: Not on file    Non-medical: Not on file  Tobacco Use  . Smoking status: Never Smoker  . Smokeless tobacco: Never Used  Substance and Sexual Activity  . Alcohol use: Not Currently    Comment: beer  . Drug use: Not Currently  . Sexual activity: Not on file  Lifestyle  . Physical activity    Days per week: Not on file    Minutes per session: Not on file  . Stress: Not on file  Relationships  . Social Herbalist on phone: Not on file    Gets together: Not on file    Attends religious service: Not on file    Active member of club or organization: Not on file    Attends meetings of clubs or organizations: Not on file    Relationship status: Not on file  . Intimate partner violence    Fear of  current or ex partner: Not on file    Emotionally abused: Not on file    Physically abused: Not on file    Forced sexual activity: Not on file  Other Topics Concern  . Not on file  Social History Narrative   [place]      Has a living will "somewhere"   Requests son [name] to make decisions for him--now has health care POA   Would accept resuscitation attempts   Probably would want tube feeds     FAMILY HISTORY:  Family History  Problem Relation Age of Onset  . Hypertension Mother   . Coronary artery disease Neg Hx   . Diabetes Neg Hx   . Cancer Neg Hx      REVIEW OF SYSTEMS:  Unable to assess due to sedation.    VITAL SIGNS:  Temp:   [98.3 F (36.8 C)-99.4 F (37.4 C)] 98.3 F (36.8 C) (10/25 0400) Pulse Rate:  [79-107] 107 (10/25 0800) Resp:  [16-31] 31 (10/25 0800) BP: (66-116)/(36-67) 66/36 (10/25 0600) SpO2:  [96 %-99 %] 99 % (10/25 0500) FiO2 (%):  [60 %-100 %] 60 % (10/25 0500)     Height: 5\' 6"  (167.6 cm) Weight: 73.8 kg BMI (Calculated): 26.26   INTAKE/OUTPUT:  This shift: Total I/O In: 308.1 [I.V.:308.1] Out: -   Last 2 shifts: @IOLAST2SHIFTS @   PHYSICAL EXAM:  Constitutional:  -- Critically ill, sedated, on mechanical ventilation Eyes:  -- Pupils pinpoint -- No scleral icterus  Ear, nose, and throat:  -- No jugular venous distension  Pulmonary:  -- bibasilar crackles Cardiovascular:  -- S1, S2 present  -- No pericardial rubs Gastrointestinal:  -- severe distended, tympanic Musculoskeletal and Integumentary:  -- sedated --skin warm and with adequate capillary refill Neurologic:  -- sedated   Labs:  CBC Latest Ref Rng & Units 04-02-18 01/01/2019 12/31/2018  WBC 4.0 - 10.5 K/uL 8.3 6.6 6.9  Hemoglobin 13.0 - 17.0 g/dL 12.8(L) 13.7 13.8  Hematocrit 39.0 - 52.0 % 40.1 40.5 41.0  Platelets 150 - 400 K/uL 203 153 168   CMP Latest Ref Rng & Units 04-02-18 01/01/2019 12/31/2018  Glucose 70 - 99 mg/dL 409(W131(H) 119(J118(H) 478(G102(H)  BUN 8 - 23 mg/dL 956(O107(H) 13(Y74(H) 86(V57(H)  Creatinine 0.61 - 1.24 mg/dL 7.84(O3.31(H) 9.62(X2.13(H) 5.28(U1.89(H)  Sodium 135 - 145 mmol/L 155(H) 145 143  Potassium 3.5 - 5.1 mmol/L 4.0 4.2 5.0  Chloride 98 - 111 mmol/L 125(H) 116(H) 114(H)  CO2 22 - 32 mmol/L 16(L) 20(L) 17(L)  Calcium 8.9 - 10.3 mg/dL 1.3(K8.4(L) 4.4(W8.0(L) 8.3(L)  Total Protein 6.5 - 8.1 g/dL 5.0(L) - -  Total Bilirubin 0.3 - 1.2 mg/dL 0.7 - -  Alkaline Phos 38 - 126 U/L 66 - -  AST 15 - 41 U/L 90(H) - -  ALT 0 - 44 U/L 6 - -     Imaging studies:  EXAM: CT ABDOMEN AND PELVIS WITHOUT CONTRAST  TECHNIQUE: Multidetector CT imaging of the abdomen and pelvis was performed following the standard protocol without IV  contrast.  COMPARISON:  Earlier today  FINDINGS: Lower chest: Bilateral pleural effusions identified left greater than right. There is also subsegmental atelectasis overlying bilateral effusions. Three vessel coronary artery atherosclerotic calcifications. Aortic atherosclerosis.  Hepatobiliary: No focal liver abnormality is seen. No gallstones, gallbladder wall thickening, or biliary dilatation.  Pancreas: Unremarkable. No pancreatic ductal dilatation or surrounding inflammatory changes.  Spleen: Normal in size without focal abnormality.  Adrenals/Urinary Tract: Normal appearance of  the adrenal glands. Bilateral nephrolithiasis identified. There is left renal atrophy. New from previous exam. Left-sided hydronephrosis and hydroureter is identified. Stone within the left ureter at the approximate L5-S1 level measures 8 mm, image 55/2. There are 2 small stones within the dependent portion of the bladder measuring up to 4 mm.  Stomach/Bowel: The nasogastric tube tip extends beyond the margin of the wall of the gastric fundus and resides in the peritoneal cavity. Extensive pneumoperitoneum is identified. Additionally, free fluid with dilute extravasated enteric contrast material is identified within the peritoneal cavity of the abdomen and pelvis.  There is increase caliber of the proximal small bowel loops which measure up to 4.2 cm. Multiple small bowel fluid levels are identified. The appendix is visualized and appears normal. Normal appearance of the colon.  Vascular/Lymphatic: Aortic atherosclerosis. No aneurysm. No abdominopelvic adenopathy.  Reproductive: Prostate gland is enlarged containing calcifications and a focal area of low-density in the right posterior gland.  Other: Large volume pneumoperitoneum, free fluid, and extravasated enteric contrast material.  Musculoskeletal: No acute or significant osseous findings.  IMPRESSION: 1. The nasogastric  tube tip extends beyond the margin of the wall of the gastric fundus and resides in the peritoneal cavity. Large volume of pneumoperitoneum and free fluid identified within the abdomen and pelvis. Findings are compatible with bowel perforation. 2. Left-sided hydronephrosis and hydroureter secondary to 8 mm left ureteral calculus. 3. Three vessel coronary artery calcifications noted. 4. Bilateral pleural effusions and overlying subsegmental atelectasis. 5. Enlarged prostate gland with focal area of low-density within the right posterior gland. Correlate for any clinical signs or symptoms of prostatitis. 6. Left renal atrophy. 7. Bilateral nephrolithiasis.  Aortic Atherosclerosis (ICD10-I70.0).   Electronically Signed   By: Signa Kell M.D.   On: 2019-01-13 06:47  Assessment/Plan:  78 y.o. male with hollow viscus perforation, complicated by pertinent comorbidities including severe parkinson disease, dysphagia, recent cardiorespiratory arrest, respiratory failure on mechanical ventilation.  Patient evaluated and found critically ill. Patient with CT scan finding of massive pneumoperitoneum and fluid consistent with hollow viscus organ perforation. Patient at this moment sedated and on mechanical ventilation. I spoke to the son for 15 minutes about patient critical situation and gave him the alternative of proceeding with surgery vs comfort measures. Son concern was that the patient was doing well yesterday with normal blood pressure and eating well and today had this event. Patient's son want to do anything possible to save patient life. I was clear to the son that the surgery is extremely high risk and patient has high chances of not tolerating the surgery and dying on the table. He still understand and wants to proceed.  I was very clear of the critical status and the life and death situation at this moment. Will take to OR as soon as possible.    Gae Gallop, MD

## 2019-01-05 NOTE — Progress Notes (Signed)
   01/10/2019 0100  Clinical Encounter Type  Visited With Patient not available;Health care provider  Visit Type Code   Code Blue page received for this patient. Care Team administered CPR. This chaplain maintained pastoral presence and offered silent prayer outside of the patient's room during code. Patient to be transferred to ICU. Chaplain will follow up.

## 2019-01-05 NOTE — Progress Notes (Signed)
CBG re-check after administering D50 was 92 mg/dL. It did not flow over into results review.

## 2019-01-05 NOTE — Transfer of Care (Signed)
Immediate Anesthesia Transfer of Care Note  Patient: Alan Peterson  Procedure(s) Performed: EXPLORATORY LAPAROTOMY (N/A )  Patient Location: PACU  Anesthesia Type:General  Level of Consciousness: sedated and unresponsive  Airway & Oxygen Therapy: Patient remains intubated per anesthesia plan and Patient placed on Ventilator (see vital sign flow sheet for setting)  Post-op Assessment: Report given to RN and Post -op Vital signs reviewed and stable  Post vital signs: Reviewed and stable  Last Vitals:  Vitals Value Taken Time  BP    Temp    Pulse    Resp 23 01/02/2019 1128  SpO2    Vitals shown include unvalidated device data.  Last Pain:  Vitals:   12/13/2018 0800  TempSrc: Axillary  PainSc:          Complications: No apparent anesthesia complications

## 2019-01-05 NOTE — Plan of Care (Signed)
  Problem: Education: Goal: Knowledge of General Education information will improve Description: Including pain rating scale, medication(s)/side effects and non-pharmacologic comfort measures Outcome: Progressing   Problem: Clinical Measurements: Goal: Ability to maintain clinical measurements within normal limits will improve Outcome: Progressing   Problem: Safety: Goal: Ability to remain free from injury will improve Outcome: Progressing   Problem: Skin Integrity: Goal: Risk for impaired skin integrity will decrease Outcome: Progressing  Dressing changed and Eucerin cream applied

## 2019-01-05 NOTE — Progress Notes (Signed)
Pharmacy Antibiotic Note  Alan Peterson is a 78 y.o. male admitted on 01/07/2019 with aspiration pneumonia and intra-abdominal infection.  Pharmacy has been consulted for Zosyn dosing.  Plan: The dose of Zosyn 3.375g IV q8h will be adjusted to 3.375g IV q12h based on renal function.   Anidulafungin 200mg  IV times one followed by 100mg  IV daily.  Height: 5\' 6"  (167.6 cm) Weight: 162 lb 9.6 oz (73.8 kg) IBW/kg (Calculated) : 63.8  Temp (24hrs), Avg:98.7 F (37.1 C), Min:98.3 F (36.8 C), Max:99.4 F (37.4 C)  Recent Labs  Lab 01/03/2019 1517 12/27/2018 1531 01/07/2019 1723 12/31/18 0001 12/31/18 0446 12/31/18 0658 01/01/19 0531 2019-01-24 0441  WBC 5.1  --   --   --  6.9  --  6.6 8.3  CREATININE 1.96*  --   --   --  1.89*  --  2.13* 3.31*  LATICACIDVEN  --  6.2* 4.5* 2.8*  --  1.9  --  2.4*    Estimated Creatinine Clearance: 16.1 mL/min (A) (by C-G formula based on SCr of 3.31 mg/dL (H)).    No Known Allergies  Antimicrobials this admission: Unasyn 10/20 >> 10/25 Zosyn 10/25 >>  Anidulafungin 10/25 >>  Microbiology results: 10/19 BCx: NG 10/25 BCx: pending  10/25 Sputum: pending   Thank you for allowing pharmacy to be a part of this patient's care.  Paulina Fusi, PharmD, BCPS 01/24/19 11:42 AM

## 2019-01-05 NOTE — Progress Notes (Addendum)
Subjective: Patient with code blue overnight.  Transferred to ICU with ROSC.  Found to have a massive hemoperitoneum.  Surgery evaluating patient for surgical intervention.    Objective: Current vital signs: BP 92/61   Pulse (!) 107   Temp 98.5 F (36.9 C) (Axillary)   Resp (!) 31   Ht 5' 6"  (1.676 m)   Wt 73.8 kg   SpO2 100%   BMI 26.24 kg/m  Vital signs in last 24 hours: Temp:  [98.3 F (36.8 C)-99.4 F (37.4 C)] 98.5 F (36.9 C) (10/25 0800) Pulse Rate:  [79-107] 107 (10/25 0800) Resp:  [16-31] 31 (10/25 0800) BP: (66-116)/(36-67) 92/61 (10/25 0800) SpO2:  [96 %-100 %] 100 % (10/25 0800) FiO2 (%):  [60 %-100 %] 60 % (10/25 0500)  Intake/Output from previous day: 10/24 0701 - 10/25 0700 In: 2706 [I.V.:426.1; IV Piggyback:1220.9] Out: 0  Intake/Output this shift: Total I/O In: 308.1 [I.V.:308.1] Out: -  Nutritional status:  Diet Order    None      Neurologic Exam: Mental Status: Alert. Speech dysarthric and minimal. Follows commands.  Cranial Nerves: II: Visual fields grossly normal III,IV, VI: ptosis not present,decreased left eye lateral gaze V,VII:facial droop, facial light touch sensation normal bilaterally VIII: hearing normal bilaterally Motor: Increased tone throughout. Moves all extremities against gravity but weakly, throughout  Lab Results: Basic Metabolic Panel: Recent Labs  Lab 01/09/2019 1517 12/31/18 0446 01/01/19 0531 12/13/2018 0441  NA 141 143 145 155*  K 4.9 5.0 4.2 4.0  CL 108 114* 116* 125*  CO2 18* 17* 20* 16*  GLUCOSE 196* 102* 118* 131*  BUN 40* 57* 74* 107*  CREATININE 1.96* 1.89* 2.13* 3.31*  CALCIUM 9.5 8.3* 8.0* 8.4*  MG  --   --   --  2.7*  PHOS  --   --   --  5.7*    Liver Function Tests: Recent Labs  Lab 01/04/2019 0441  AST 90*  ALT 6  ALKPHOS 66  BILITOT 0.7  PROT 5.0*  ALBUMIN 2.1*   No results for input(s): LIPASE, AMYLASE in the last 168 hours. No results for input(s): AMMONIA in the last 168  hours.  CBC: Recent Labs  Lab 01/06/2019 1517 12/31/18 0446 01/01/19 0531 01/11/2019 0441  WBC 5.1 6.9 6.6 8.3  NEUTROABS  --   --   --  7.7  HGB 15.4 13.8 13.7 12.8*  HCT 45.9 41.0 40.5 40.1  MCV 94.8 92.8 92.9 96.4  PLT 231 168 153 203    Cardiac Enzymes: No results for input(s): CKTOTAL, CKMB, CKMBINDEX, TROPONINI in the last 168 hours.  Lipid Panel: No results for input(s): CHOL, TRIG, HDL, CHOLHDL, VLDL, LDLCALC in the last 168 hours.  CBG: Recent Labs  Lab 12/21/2018 0140 12/24/2018 0350 12/22/2018 0753  GLUCAP 103* 118* 86    Microbiology: Results for orders placed or performed during the hospital encounter of 01/11/2019  Blood Culture (routine x 2)     Status: None   Collection Time: 12/25/2018  4:18 PM   Specimen: BLOOD  Result Value Ref Range Status   Specimen Description BLOOD LEFT ANTECUBITAL  Final   Special Requests   Final    BOTTLES DRAWN AEROBIC AND ANAEROBIC Blood Culture adequate volume   Culture   Final    NO GROWTH 5 DAYS Performed at [facility], [address], [place], [place] [address]    Report Status 01/04/2019 FINAL  Final  Blood Culture (routine x 2)     Status:  None   Collection Time: 12/31/2018  4:23 PM   Specimen: BLOOD  Result Value Ref Range Status   Specimen Description BLOOD BLOOD RIGHT FOREARM  Final   Special Requests   Final    BOTTLES DRAWN AEROBIC AND ANAEROBIC Blood Culture adequate volume   Culture   Final    NO GROWTH 5 DAYS Performed at [facility], [address] [place], [place] [address]    Report Status 01/04/2019 FINAL  Final  SARS CORONAVIRUS 2 (TAT 6-24 HRS) Nasopharyngeal Nasopharyngeal Swab     Status: None   Collection Time: 01/11/2019  4:45 PM   Specimen: Nasopharyngeal Swab  Result Value Ref Range Status   SARS Coronavirus 2 NEGATIVE NEGATIVE Final    Comment: (NOTE) SARS-CoV-2 target nucleic acids are NOT DETECTED. The SARS-CoV-2 RNA is generally detectable in upper and lower respiratory  specimens during the acute phase of infection. Negative results do not preclude SARS-CoV-2 infection, do not rule out co-infections with other pathogens, and should not be used as the sole basis for treatment or other patient management decisions. Negative results must be combined with clinical observations, patient history, and epidemiological information. The expected result is Negative. Fact Sheet for Patients: SugarRoll.be Fact Sheet for Healthcare Providers: https://www.woods-mathews.com/ This test is not yet approved or cleared by the [place] FDA and  has been authorized for detection and/or diagnosis of SARS-CoV-2 by FDA under an Emergency Use Authorization (EUA). This EUA will remain  in effect (meaning this test can be used) for the duration of the COVID-19 declaration under Section 56 4(b)(1) of the Act, 21 U.S.C. section 360bbb-3(b)(1), unless the authorization is terminated or revoked sooner. Performed at [facility], [place] [address], [place], [place] [address]   MRSA PCR Screening     Status: None   Collection Time: 12/31/18  8:42 AM   Specimen: Nasal Mucosa; Nasopharyngeal  Result Value Ref Range Status   MRSA by PCR NEGATIVE NEGATIVE Final    Comment:        The GeneXpert MRSA Assay (FDA approved for NASAL specimens only), is one component of a comprehensive MRSA colonization surveillance program. It is not intended to diagnose MRSA infection nor to guide or monitor treatment for MRSA infections. Performed at [facility], [address] [place], [place] [address]   MRSA PCR Screening     Status: None   Collection Time: 01/03/2019  2:10 AM   Specimen: Nasal Mucosa; Nasopharyngeal  Result Value Ref Range Status   MRSA by PCR NEGATIVE NEGATIVE Final    Comment:        The GeneXpert MRSA Assay (FDA approved for NASAL specimens only), is one component of a comprehensive MRSA  colonization surveillance program. It is not intended to diagnose MRSA infection nor to guide or monitor treatment for MRSA infections. Performed at [facility], [address] [place], [place] [address]   CULTURE, BLOOD (ROUTINE X 2) w Reflex to ID Panel     Status: None (Preliminary result)   Collection Time: 01/10/2019  4:41 AM   Specimen: BLOOD  Result Value Ref Range Status   Specimen Description BLOOD LEFT ANTECUBITAL  Final   Special Requests   Final    BOTTLES DRAWN AEROBIC AND ANAEROBIC Blood Culture adequate volume   Culture   Final    NO GROWTH <12 HOURS Performed at [facility], [address] [place],  [address]    Report Status PENDING  Incomplete  CULTURE,  BLOOD (ROUTINE X 2) w Reflex to ID Panel     Status: None (Preliminary result)   Collection Time: 01/11/2019  4:41 AM   Specimen: BLOOD  Result Value Ref Range Status   Specimen Description BLOOD LEFT FOREARM  Final   Special Requests   Final    BOTTLES DRAWN AEROBIC AND ANAEROBIC Blood Culture adequate volume   Culture   Final    NO GROWTH <12 HOURS Performed at [facility], [address] [place], [place] [address]    Report Status PENDING  Incomplete    Coagulation Studies: No results for input(s): LABPROT, INR in the last 72 hours.  Imaging: Ct Abdomen Pelvis Wo Contrast  Result Date: 12/25/2018 CLINICAL DATA:  Abdominal distention. EXAM: CT ABDOMEN AND PELVIS WITHOUT CONTRAST TECHNIQUE: Multidetector CT imaging of the abdomen and pelvis was performed following the standard protocol without IV contrast. COMPARISON:  Earlier today FINDINGS: Lower chest: Bilateral pleural effusions identified left greater than right. There is also subsegmental atelectasis overlying bilateral effusions. Three vessel coronary artery atherosclerotic calcifications. Aortic atherosclerosis. Hepatobiliary: No focal liver abnormality is seen. No gallstones, gallbladder wall thickening, or  biliary dilatation. Pancreas: Unremarkable. No pancreatic ductal dilatation or surrounding inflammatory changes. Spleen: Normal in size without focal abnormality. Adrenals/Urinary Tract: Normal appearance of the adrenal glands. Bilateral nephrolithiasis identified. There is left renal atrophy. New from previous exam. Left-sided hydronephrosis and hydroureter is identified. Stone within the left ureter at the approximate L5-S1 level measures 8 mm, image 55/2. There are 2 small stones within the dependent portion of the bladder measuring up to 4 mm. Stomach/Bowel: The nasogastric tube tip extends beyond the margin of the wall of the gastric fundus and resides in the peritoneal cavity. Extensive pneumoperitoneum is identified. Additionally, free fluid with dilute extravasated enteric contrast material is identified within the peritoneal cavity of the abdomen and pelvis. There is increase caliber of the proximal small bowel loops which measure up to 4.2 cm. Multiple small bowel fluid levels are identified. The appendix is visualized and appears normal. Normal appearance of the colon. Vascular/Lymphatic: Aortic atherosclerosis. No aneurysm. No abdominopelvic adenopathy. Reproductive: Prostate gland is enlarged containing calcifications and a focal area of low-density in the right posterior gland. Other: Large volume pneumoperitoneum, free fluid, and extravasated enteric contrast material. Musculoskeletal: No acute or significant osseous findings. IMPRESSION: 1. The nasogastric tube tip extends beyond the margin of the wall of the gastric fundus and resides in the peritoneal cavity. Large volume of pneumoperitoneum and free fluid identified within the abdomen and pelvis. Findings are compatible with bowel perforation. 2. Left-sided hydronephrosis and hydroureter secondary to 8 mm left ureteral calculus. 3. Three vessel coronary artery calcifications noted. 4. Bilateral pleural effusions and overlying subsegmental  atelectasis. 5. Enlarged prostate gland with focal area of low-density within the right posterior gland. Correlate for any clinical signs or symptoms of prostatitis. 6. Left renal atrophy. 7. Bilateral nephrolithiasis. Aortic Atherosclerosis (ICD10-I70.0). Electronically Signed   By: Kerby Moors M.D.   On: 01/10/2019 06:47   Dg Abd 1 View  Result Date: 12/28/2018 CLINICAL DATA:  78 year old male status post enteric tube placement. EXAM: PORTABLE CHEST 1 VIEW COMPARISON:  Chest radiograph dated 12/23/2018 FINDINGS: Endotracheal tube approximately 4 cm above the carina. An enteric tube extends into the left upper abdomen with tip likely in the stomach. Probable small left pleural effusion and associated atelectatic changes of the left lung base. No focal consolidation or pneumothorax. Mild cardiomegaly. There is apparent outlining of the bowel  wall with air (Rigler's sign) concerning for pneumoperitoneum. Further evaluation with CT is recommended. There is degenerative changes of the spine. IMPRESSION: 1. Endotracheal tube above the carina. 2. Enteric tube with tip in the stomach. 3. Findings concerning for pneumoperitoneum. Further evaluation with CT is recommended. 4. Probable small left pleural effusion and left lung base atelectasis. These results were called by telephone at the time of interpretation on 12/29/2018 at 3:47 am to provider [facility] , who verbally acknowledged these results. Electronically Signed   By: Anner Crete M.D.   On: 01/06/2019 03:50   Dg Chest Port 1 View  Result Date: 12/28/2018 CLINICAL DATA:  Unsuccessful attempt at left IJ catheter placement. Rule out PTX EXAM: PORTABLE CHEST 1 VIEW COMPARISON:  01/10/2019 FINDINGS: ET tube tip is above the carina. There is a enteric tube with tip below the field of view. No pneumothorax following attempted IJ catheter placement. Normal heart size. Decreased lung volumes. Small bilateral pleural effusions noted with overlying  compressive type atelectasis. IMPRESSION: 1. No pneumothorax after attempted IJ catheter placement. 2. Small bilateral pleural effusions with overlying compressive type atelectasis. 3. The enteric tube tip is above the carina. Electronically Signed   By: Kerby Moors M.D.   On: 01/04/2019 08:34   Dg Chest Port 1 View  Result Date: 12/19/2018 CLINICAL DATA:  78 year old male status post enteric tube placement. EXAM: PORTABLE CHEST 1 VIEW COMPARISON:  Chest radiograph dated 01/02/2019 FINDINGS: Endotracheal tube approximately 4 cm above the carina. An enteric tube extends into the left upper abdomen with tip likely in the stomach. Probable small left pleural effusion and associated atelectatic changes of the left lung base. No focal consolidation or pneumothorax. Mild cardiomegaly. There is apparent outlining of the bowel wall with air (Rigler's sign) concerning for pneumoperitoneum. Further evaluation with CT is recommended. There is degenerative changes of the spine. IMPRESSION: 1. Endotracheal tube above the carina. 2. Enteric tube with tip in the stomach. 3. Findings concerning for pneumoperitoneum. Further evaluation with CT is recommended. 4. Probable small left pleural effusion and left lung base atelectasis. These results were called by telephone at the time of interpretation on 12/25/2018 at 3:47 am to provider [facility] , who verbally acknowledged these results. Electronically Signed   By: Anner Crete M.D.   On: 12/25/2018 03:50    Medications:  I have reviewed the patient's current medications. Scheduled: . chlorhexidine gluconate (MEDLINE KIT)  15 mL Mouth Rinse BID  . Chlorhexidine Gluconate Cloth  6 each Topical Daily  . heparin  5,000 Units Subcutaneous Q8H  . hydrocerin   Topical BID  . mouth rinse  15 mL Mouth Rinse 10 times per day  . polyvinyl alcohol  1 drop Both Eyes Q4H  . rotigotine  1 patch Transdermal Daily    Assessment/Plan: 78 year old male admitted with  weakness and difficulty swallowing.  Coded overnight.  Now with hemoperitoneum.  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  It appeared that he was having some improvement.  Recommendations: 1.  Patient to have surgery.  Would benefit from feeding tube placement at that time as well since this will help with medication administration and nutrition support.   2. D/C Neupro   LOS: 6 days   Alexis Goodell, MD Neurology [phone] 12/12/2018  9:04 AM

## 2019-01-05 NOTE — Progress Notes (Signed)
Pt began to have what appeared to be tonic-clonic movements to extremities and facial muscles. Pt also appeared to be decerebrate posturing. Dr. Lanney Gins notified and verbal orders for 2mg  Ativan were given. Seizure like activity stopped approximately 1-2 minutes after Ativan was given.

## 2019-01-05 NOTE — Progress Notes (Signed)
[place] at [facility]                                                                                                                                                                                  Patient Demographics   Alan Peterson, is a 78 y.o. male, DOB - Aug 03, 1940, OFB:[id number]  Admit date - 12/31/2018   Admitting Physician Saundra Shelling, MD  Outpatient Primary MD for the patient is [name], MD   LOS - 6  Subjective: Events overnight noted Patient has been having bowel movements daily and there was no evidence of any GI issues until yesterday.  From day 1 patient's son has been told that his father's condition is getting worse.  Currently on the vent   Review of Systems:   CONSTITUTIONAL: Intubated Vitals:   Vitals:   01/09/2019 0900 12/31/2018 1115 01/04/2019 1200 12/22/2018 1300  BP: (!) 67/50  (!) 43/35 (!) 149/54  Pulse: (!) 33     Resp: (!) 26     Temp:   97.7 F (36.5 C)   TempSrc:   Axillary   SpO2: 94% 98% 100%   Weight:      Height:        Wt Readings from Last 3 Encounters:  01/04/19 73.8 kg  12/07/17 70.8 kg  06/04/17 73.5 kg     Intake/Output Summary (Last 24 hours) at 12/22/2018 1326 Last data filed at 12/29/2018 1200 Gross per 24 hour  Intake 3085.56 ml  Output 1200 ml  Net 1885.56 ml    Physical Exam:   GENERAL: Critically ill  HEAD, EYES, EARS, NOSE AND THROAT: Atraumatic, normocephalic.  Pupils equal and reactive to light. Sclerae anicteric. No conjunctival injection. No oro-pharyngeal erythema.  NECK: Supple. There is no jugular venous distention. No bruits, no lymphadenopathy, no thyromegaly.  HEART: Regular rate and rhythm,. No murmurs, no rubs, no clicks.  LUNGS: Clear to auscultation bilaterally. No rales or rhonchi. No wheezes.  ABDOMEN: Postop EXTREMITIES: No evidence of any cyanosis, clubbing, or peripheral edema.  +2 pedal and radial pulses bilaterally.  NEUROLOGIC: Sedated.  SKIN: Moist and  warm with no rashes appreciated.  Psych: Sedated LN: No inguinal LN enlargement    Antibiotics   Anti-infectives (From admission, onward)   Start     Dose/Rate Route Frequency Ordered Stop   01/06/19 1000  anidulafungin (ERAXIS) 100 mg in sodium chloride 0.9 % 100 mL IVPB     100 mg 78 mL/hr over 100 Minutes Intravenous Every 24 hours 01/07/2019 1146     12/25/2018 1300  anidulafungin (ERAXIS) 200 mg in sodium chloride 0.9 % 200 mL IVPB  200 mg 78 mL/hr over 200 Minutes Intravenous  Once 01/02/2019 1146     01/07/2019 1230  anidulafungin (ERAXIS) 100 mg in sodium chloride 0.9 % 100 mL IVPB  Status:  Discontinued     100 mg 78 mL/hr over 100 Minutes Intravenous Every 24 hours 01/02/2019 1132 12/22/2018 1146   01/06/2019 1145  piperacillin-tazobactam (ZOSYN) IVPB 3.375 g  Status:  Discontinued     3.375 g 12.5 mL/hr over 240 Minutes Intravenous Every 8 hours 12/21/2018 1132 12/29/2018 1136   01/09/2019 1145  piperacillin-tazobactam (ZOSYN) IVPB 3.375 g     3.375 g 12.5 mL/hr over 240 Minutes Intravenous Every 12 hours 12/21/2018 1136     12/31/18 1600  ceFEPIme (MAXIPIME) 2 g in sodium chloride 0.9 % 100 mL IVPB  Status:  Discontinued     2 g 200 mL/hr over 30 Minutes Intravenous Every 24 hours 12/24/2018 1858 12/31/18 1158   12/31/18 1207  Ampicillin-Sulbactam (UNASYN) 3 g in sodium chloride 0.9 % 100 mL IVPB  Status:  Discontinued     3 g 200 mL/hr over 30 Minutes Intravenous Every 12 hours 12/31/18 1207 12/17/2018 0616   12/31/18 1200  Ampicillin-Sulbactam (UNASYN) 3 g in sodium chloride 0.9 % 100 mL IVPB  Status:  Discontinued     3 g 200 mL/hr over 30 Minutes Intravenous Every 6 hours 12/31/18 1158 12/31/18 1207   01/11/2019 1849  vancomycin variable dose per unstable renal function (pharmacist dosing)  Status:  Discontinued      Does not apply See admin instructions 01/06/2019 1858 12/31/18 1158   01/01/2019 1800  vancomycin (VANCOCIN) 500 mg in sodium chloride 0.9 % 100 mL IVPB     500 mg 100 mL/hr over  60 Minutes Intravenous  Once 01/02/2019 1628 01/10/2019 1941   12/22/2018 1630  ceFEPIme (MAXIPIME) 2 g in sodium chloride 0.9 % 100 mL IVPB     2 g 200 mL/hr over 30 Minutes Intravenous  Once 12/29/2018 1617 12/24/2018 1701   12/18/2018 1630  vancomycin (VANCOCIN) IVPB 1000 mg/200 mL premix     1,000 mg 200 mL/hr over 60 Minutes Intravenous  Once 12/16/2018 1617 12/24/2018 1838      Medications   Scheduled Meds: . chlorhexidine gluconate (MEDLINE KIT)  15 mL Mouth Rinse BID  . Chlorhexidine Gluconate Cloth  6 each Topical Daily  . heparin  5,000 Units Subcutaneous Q8H  . hydrocerin   Topical BID  . hydrocortisone sod succinate (SOLU-CORTEF) inj  200 mg Intravenous BID  . mouth rinse  15 mL Mouth Rinse 10 times per day  . polyvinyl alcohol  1 drop Both Eyes Q4H  . rotigotine  1 patch Transdermal Daily   Continuous Infusions: . sodium chloride Stopped (12/26/2018 0136)  . [START ON 01/06/2019] anidulafungin    . anidulafungin    . dextrose Stopped (12/12/2018 0915)  . DOPamine    . epinephrine Stopped (01/07/2019 1304)  . famotidine (PEPCID) IV Stopped (01/10/2019 0416)  . fentaNYL infusion INTRAVENOUS    . norepinephrine (LEVOPHED) Adult infusion 41 mcg/min (01/02/2019 1028)  . phenylephrine (NEO-SYNEPHRINE) Adult infusion 300 mcg/min (12/28/2018 1255)  . piperacillin-tazobactam (ZOSYN)  IV 3.375 g (12/24/2018 1154)  . vasopressin (PITRESSIN) infusion - *FOR SHOCK* 0.03 Units/min (12/12/2018 1236)   PRN Meds:.sodium chloride, fentaNYL, midazolam, midazolam, [DISCONTINUED] ondansetron **OR** ondansetron (ZOFRAN) IV   Data Review:   Micro Results Recent Results (from the past 240 hour(s))  Blood Culture (routine x 2)     Status: None   Collection  Time: 12/15/2018  4:18 PM   Specimen: BLOOD  Result Value Ref Range Status   Specimen Description BLOOD LEFT ANTECUBITAL  Final   Special Requests   Final    BOTTLES DRAWN AEROBIC AND ANAEROBIC Blood Culture adequate volume   Culture   Final    NO GROWTH 5  DAYS Performed at [facility], [address] [place], [place] [address]    Report Status 01/04/2019 FINAL  Final  Blood Culture (routine x 2)     Status: None   Collection Time: 12/16/2018  4:23 PM   Specimen: BLOOD  Result Value Ref Range Status   Specimen Description BLOOD BLOOD RIGHT FOREARM  Final   Special Requests   Final    BOTTLES DRAWN AEROBIC AND ANAEROBIC Blood Culture adequate volume   Culture   Final    NO GROWTH 5 DAYS Performed at [facility], [address] [place], [place] [address]    Report Status 01/04/2019 FINAL  Final  SARS CORONAVIRUS 2 (TAT 6-24 HRS) Nasopharyngeal Nasopharyngeal Swab     Status: None   Collection Time: 12/23/2018  4:45 PM   Specimen: Nasopharyngeal Swab  Result Value Ref Range Status   SARS Coronavirus 2 NEGATIVE NEGATIVE Final    Comment: (NOTE) SARS-CoV-2 target nucleic acids are NOT DETECTED. The SARS-CoV-2 RNA is generally detectable in upper and lower respiratory specimens during the acute phase of infection. Negative results do not preclude SARS-CoV-2 infection, do not rule out co-infections with other pathogens, and should not be used as the sole basis for treatment or other patient management decisions. Negative results must be combined with clinical observations, patient history, and epidemiological information. The expected result is Negative. Fact Sheet for Patients: SugarRoll.be Fact Sheet for Healthcare Providers: https://www.woods-mathews.com/ This test is not yet approved or cleared by the [place] FDA and  has been authorized for detection and/or diagnosis of SARS-CoV-2 by FDA under an Emergency Use Authorization (EUA). This EUA will remain  in effect (meaning this test can be used) for the duration of the COVID-19 declaration under Section 56 4(b)(1) of the Act, 21 U.S.C. section 360bbb-3(b)(1), unless the authorization is terminated or revoked  sooner. Performed at [facility], [place] [address] [place], [place] [address]   MRSA PCR Screening     Status: None   Collection Time: 12/31/18  8:42 AM   Specimen: Nasal Mucosa; Nasopharyngeal  Result Value Ref Range Status   MRSA by PCR NEGATIVE NEGATIVE Final    Comment:        The GeneXpert MRSA Assay (FDA approved for NASAL specimens only), is one component of a comprehensive MRSA colonization surveillance program. It is not intended to diagnose MRSA infection nor to guide or monitor treatment for MRSA infections. Performed at [facility], [address] [place], [place] [address]   MRSA PCR Screening     Status: None   Collection Time: 01/07/2019  2:10 AM   Specimen: Nasal Mucosa; Nasopharyngeal  Result Value Ref Range Status   MRSA by PCR NEGATIVE NEGATIVE Final    Comment:        The GeneXpert MRSA Assay (FDA approved for NASAL specimens only), is one component of a comprehensive MRSA colonization surveillance program. It is not intended to diagnose MRSA infection nor to guide or monitor treatment for MRSA infections. Performed at [facility], [place], [place] [address]   CULTURE, BLOOD (ROUTINE X 2) w Reflex to ID Panel  Status: None (Preliminary result)   Collection Time: 12/28/2018  4:41 AM   Specimen: BLOOD  Result Value Ref Range Status   Specimen Description BLOOD LEFT ANTECUBITAL  Final   Special Requests   Final    BOTTLES DRAWN AEROBIC AND ANAEROBIC Blood Culture adequate volume   Culture   Final    NO GROWTH <12 HOURS Performed at [facility], [address] [place], [place] [address]    Report Status PENDING  Incomplete  CULTURE, BLOOD (ROUTINE X 2) w Reflex to ID Panel     Status: None (Preliminary result)   Collection Time: 01/04/2019  4:41 AM   Specimen: BLOOD  Result Value Ref Range Status   Specimen Description BLOOD LEFT FOREARM  Final   Special Requests   Final    BOTTLES DRAWN  AEROBIC AND ANAEROBIC Blood Culture adequate volume   Culture   Final    NO GROWTH <12 HOURS Performed at [facility], [address], [place], [place] [address]    Report Status PENDING  Incomplete    Radiology Reports Ct Abdomen Pelvis Wo Contrast  Result Date: 12/29/2018 CLINICAL DATA:  Abdominal distention. EXAM: CT ABDOMEN AND PELVIS WITHOUT CONTRAST TECHNIQUE: Multidetector CT imaging of the abdomen and pelvis was performed following the standard protocol without IV contrast. COMPARISON:  Earlier today FINDINGS: Lower chest: Bilateral pleural effusions identified left greater than right. There is also subsegmental atelectasis overlying bilateral effusions. Three vessel coronary artery atherosclerotic calcifications. Aortic atherosclerosis. Hepatobiliary: No focal liver abnormality is seen. No gallstones, gallbladder wall thickening, or biliary dilatation. Pancreas: Unremarkable. No pancreatic ductal dilatation or surrounding inflammatory changes. Spleen: Normal in size without focal abnormality. Adrenals/Urinary Tract: Normal appearance of the adrenal glands. Bilateral nephrolithiasis identified. There is left renal atrophy. New from previous exam. Left-sided hydronephrosis and hydroureter is identified. Stone within the left ureter at the approximate L5-S1 level measures 8 mm, image 55/2. There are 2 small stones within the dependent portion of the bladder measuring up to 4 mm. Stomach/Bowel: The nasogastric tube tip extends beyond the margin of the wall of the gastric fundus and resides in the peritoneal cavity. Extensive pneumoperitoneum is identified. Additionally, free fluid with dilute extravasated enteric contrast material is identified within the peritoneal cavity of the abdomen and pelvis. There is increase caliber of the proximal small bowel loops which measure up to 4.2 cm. Multiple small bowel fluid levels are identified. The appendix is visualized and appears normal. Normal  appearance of the colon. Vascular/Lymphatic: Aortic atherosclerosis. No aneurysm. No abdominopelvic adenopathy. Reproductive: Prostate gland is enlarged containing calcifications and a focal area of low-density in the right posterior gland. Other: Large volume pneumoperitoneum, free fluid, and extravasated enteric contrast material. Musculoskeletal: No acute or significant osseous findings. IMPRESSION: 1. The nasogastric tube tip extends beyond the margin of the wall of the gastric fundus and resides in the peritoneal cavity. Large volume of pneumoperitoneum and free fluid identified within the abdomen and pelvis. Findings are compatible with bowel perforation. 2. Left-sided hydronephrosis and hydroureter secondary to 8 mm left ureteral calculus. 3. Three vessel coronary artery calcifications noted. 4. Bilateral pleural effusions and overlying subsegmental atelectasis. 5. Enlarged prostate gland with focal area of low-density within the right posterior gland. Correlate for any clinical signs or symptoms of prostatitis. 6. Left renal atrophy. 7. Bilateral nephrolithiasis. Aortic Atherosclerosis (ICD10-I70.0). Electronically Signed   By: Kerby Moors M.D.   On: 12/16/2018 06:47   Dg Abd 1 View  Result Date: 12/16/2018 CLINICAL  DATA:  78 year old male status post enteric tube placement. EXAM: PORTABLE CHEST 1 VIEW COMPARISON:  Chest radiograph dated 12/15/2018 FINDINGS: Endotracheal tube approximately 4 cm above the carina. An enteric tube extends into the left upper abdomen with tip likely in the stomach. Probable small left pleural effusion and associated atelectatic changes of the left lung base. No focal consolidation or pneumothorax. Mild cardiomegaly. There is apparent outlining of the bowel wall with air (Rigler's sign) concerning for pneumoperitoneum. Further evaluation with CT is recommended. There is degenerative changes of the spine. IMPRESSION: 1. Endotracheal tube above the carina. 2. Enteric tube  with tip in the stomach. 3. Findings concerning for pneumoperitoneum. Further evaluation with CT is recommended. 4. Probable small left pleural effusion and left lung base atelectasis. These results were called by telephone at the time of interpretation on 01/04/2019 at 3:47 am to provider [facility] , who verbally acknowledged these results. Electronically Signed   By: Anner Crete M.D.   On: 12/23/2018 03:50   Ct Head Wo Contrast  Result Date: 01/02/2019 CLINICAL DATA:  Altered level of consciousness and weakness EXAM: CT HEAD WITHOUT CONTRAST TECHNIQUE: Contiguous axial images were obtained from the base of the skull through the vertex without intravenous contrast. COMPARISON:  08/24/2017 FINDINGS: Brain: Considerable motion artifact is identified with severe limitations on the exam. There is persistent increased density along the anterior cranial fossa particularly on the left similar to that seen on the prior exam. Considerable beam hardening artifact is identified limiting the examination although the persistent density is suspicious for underlying meningioma. If the patient can undergo MRI examination that would be helpful. Diffuse atrophic changes are seen. Chronic white matter ischemic changes are noted. No findings to suggest acute hemorrhage or acute infarction are seen. Vascular: No hyperdense vessel or unexpected calcification. Skull: Normal. Negative for fracture or focal lesion. Sinuses/Orbits: No acute finding. Other: Stable fluid within the mastoid air cells on the left. IMPRESSION: Chronic atrophic and ischemic changes stable from previous exam. Persistent increased density is noted along the base of the anterior cranial fossa particularly on the left. Although this is likely related to underlying beam hardening artifact the possibility of a meningioma could not be totally excluded. MRI would be helpful in this regard. Electronically Signed   By: Inez Catalina M.D.   On: 01/02/2019 10:54    Dg Chest Port 1 View  Result Date: 12/13/2018 CLINICAL DATA:  Unsuccessful attempt at left IJ catheter placement. Rule out PTX EXAM: PORTABLE CHEST 1 VIEW COMPARISON:  12/29/2018 FINDINGS: ET tube tip is above the carina. There is a enteric tube with tip below the field of view. No pneumothorax following attempted IJ catheter placement. Normal heart size. Decreased lung volumes. Small bilateral pleural effusions noted with overlying compressive type atelectasis. IMPRESSION: 1. No pneumothorax after attempted IJ catheter placement. 2. Small bilateral pleural effusions with overlying compressive type atelectasis. 3. The enteric tube tip is above the carina. Electronically Signed   By: Kerby Moors M.D.   On: 12/15/2018 08:34   Dg Chest Port 1 View  Result Date: 12/22/2018 CLINICAL DATA:  78 year old male status post enteric tube placement. EXAM: PORTABLE CHEST 1 VIEW COMPARISON:  Chest radiograph dated 12/31/2018 FINDINGS: Endotracheal tube approximately 4 cm above the carina. An enteric tube extends into the left upper abdomen with tip likely in the stomach. Probable small left pleural effusion and associated atelectatic changes of the left lung base. No focal consolidation or pneumothorax. Mild cardiomegaly. There is apparent outlining  of the bowel wall with air (Rigler's sign) concerning for pneumoperitoneum. Further evaluation with CT is recommended. There is degenerative changes of the spine. IMPRESSION: 1. Endotracheal tube above the carina. 2. Enteric tube with tip in the stomach. 3. Findings concerning for pneumoperitoneum. Further evaluation with CT is recommended. 4. Probable small left pleural effusion and left lung base atelectasis. These results were called by telephone at the time of interpretation on 12/27/2018 at 3:47 am to provider [facility] , who verbally acknowledged these results. Electronically Signed   By: Anner Crete M.D.   On: 01/01/2019 03:50   Dg Chest Portable 1  View  Result Date: 12/29/2018 CLINICAL DATA:  Chest pain, shortness of breath. EXAM: PORTABLE CHEST 1 VIEW COMPARISON:  None. FINDINGS: The heart size and mediastinal contours are within normal limits. Right lung is clear. No pneumothorax is noted. Mild left basilar atelectasis is noted with probable small left pleural effusion. The visualized skeletal structures are unremarkable. IMPRESSION: Mild left basilar subsegmental atelectasis with probable small left pleural effusion. Electronically Signed   By: Marijo Conception M.D.   On: 01/04/2019 15:52     CBC Recent Labs  Lab 01/04/2019 1517 12/31/18 0446 01/01/19 0531 12/25/2018 0441  WBC 5.1 6.9 6.6 8.3  HGB 15.4 13.8 13.7 12.8*  HCT 45.9 41.0 40.5 40.1  PLT 231 168 153 203  MCV 94.8 92.8 92.9 96.4  MCH 31.8 31.2 31.4 30.8  MCHC 33.6 33.7 33.8 31.9  RDW 12.9 13.1 13.2 14.6  LYMPHSABS  --   --   --  0.4*  MONOABS  --   --   --  0.2  EOSABS  --   --   --  0.0  BASOSABS  --   --   --  0.1    Chemistries  Recent Labs  Lab 12/14/2018 1517 12/31/18 0446 01/01/19 0531 12/28/2018 0441  NA 141 143 145 155*  K 4.9 5.0 4.2 4.0  CL 108 114* 116* 125*  CO2 18* 17* 20* 16*  GLUCOSE 196* 102* 118* 131*  BUN 40* 57* 74* 107*  CREATININE 1.96* 1.89* 2.13* 3.31*  CALCIUM 9.5 8.3* 8.0* 8.4*  MG  --   --   --  2.7*  AST  --   --   --  90*  ALT  --   --   --  6  ALKPHOS  --   --   --  66  BILITOT  --   --   --  0.7   ------------------------------------------------------------------------------------------------------------------ estimated creatinine clearance is 16.1 mL/min (A) (by C-G formula based on SCr of 3.31 mg/dL (H)). ------------------------------------------------------------------------------------------------------------------ No results for input(s): HGBA1C in the last 72 hours. ------------------------------------------------------------------------------------------------------------------ No results for input(s): CHOL, HDL,  LDLCALC, TRIG, CHOLHDL, LDLDIRECT in the last 72 hours. ------------------------------------------------------------------------------------------------------------------ No results for input(s): TSH, T4TOTAL, T3FREE, THYROIDAB in the last 72 hours.  Invalid input(s): FREET3 ------------------------------------------------------------------------------------------------------------------ No results for input(s): VITAMINB12, FOLATE, FERRITIN, TIBC, IRON, RETICCTPCT in the last 72 hours.  Coagulation profile No results for input(s): INR, PROTIME in the last 168 hours.  No results for input(s): DDIMER in the last 72 hours.  Cardiac Enzymes No results for input(s): CKMB, TROPONINI, MYOGLOBIN in the last 168 hours.  Invalid input(s): CK ------------------------------------------------------------------------------------------------------------------ Invalid input(s): POCBNP    Assessment & Plan    78 yr old male patient with a known history of parkinsons disease, BPH , hyperlipidemia presents to ER with generalized weakness and difficulty breathing. Patient also complaints of chest discomfort  on deep breathing..   -Acute respiratory failure with needing cardiac resuscitation Patient's son has been told since admission his dad's condition is poor especially with the Parkinson's.  Continue current management he wants to be aggressive with his father's care    -Sepsis Continue broad-spectrum antibiotics with Zosyn    -Hypotension pressures continue pressors    -Status post surgery status post gastric ulcer repair with open gGastrostomy tube    -Parkinson's with worsening has been seen by neurology Per son patient's father was doing well prior to admission based on his presentation consistent with chronic ongoing issues with his Parkinson's   -Dysphagia patient has severe dysphagia is pocketing his food.  Was read recommended for n.p.o. status however son kept insisting on  feeding his father.   Prognosis very poor in this 29 year old with worsening Parkinson's now on the ventilator multiple pressors status post surgery         Code Status Orders  (From admission, onward)         Start     Ordered   12/23/2018 2021  Full code  Continuous     12/13/2018 2020        Code Status History    This patient has a current code status but no historical code status.   Advance Care Planning Activity    Advance Directive Documentation     Most Recent Value  Type of Advance Directive  Living will  Pre-existing out of facility DNR order (yellow form or pink MOST form)  -  "MOST" Form in Place?  -           Consults none  DVT Prophylaxis SCDs Lab Results  Component Value Date   PLT 203 12/21/2018     Time Spent in minutes 35 minutes  Greater than 50% of time spent    Dustin Flock M.D on 12/22/2018 at 1:26 PM  Between 7am to 6pm - Pager - [phone]  After 6pm go to [email or web address] - Proofreader  [employer]   Office  [phone]

## 2019-01-05 NOTE — Anesthesia Post-op Follow-up Note (Signed)
Anesthesia QCDR form completed.        

## 2019-01-05 NOTE — Anesthesia Postprocedure Evaluation (Signed)
Anesthesia Post Note  Patient: Alan Peterson  Procedure(s) Performed: EXPLORATORY LAPAROTOMY (N/A )  Patient location during evaluation: PACU Anesthesia Type: General Level of consciousness: awake and alert Pain management: pain level controlled Vital Signs Assessment: post-procedure vital signs reviewed and stable Respiratory status: spontaneous breathing, nonlabored ventilation, respiratory function stable, patient connected to nasal cannula oxygen, patient remains intubated per anesthesia plan and patient on ventilator - see flowsheet for VS Cardiovascular status: blood pressure returned to baseline and stable Postop Assessment: no apparent nausea or vomiting Anesthetic complications: no     Last Vitals:  Vitals:   12/15/2018 1200 01/03/2019 1300  BP: (!) 43/35 (!) 149/54  Pulse:    Resp:    Temp: 36.5 C   SpO2: 100%     Last Pain:  Vitals:   12/19/2018 1200  TempSrc: Axillary       BP 98/40 on arrival in ICU. Pt has done better than expected thru surgery.          Alan Peterson S

## 2019-01-05 NOTE — Progress Notes (Signed)
PT Cancellation Note  Patient Details Name: Alan Peterson MRN: [id number] DOB: 1941-03-01   Cancelled Treatment:    Reason Eval/Treat Not Completed: Medical issues which prohibited therapy(Per chart review, patient now transferred to CCU status post CODE BLUE and exploratory laparotomy due to perforated viscous.  Per guidelines, will require new orders to resume PT services after change in status.  Will complete initial order at this time; please re-consult as medically appropriate.)    Hayly Litsey H. Owens Shark, PT, DPT, NCS 12/14/2018, 9:43 PM [phone]

## 2019-01-05 NOTE — Progress Notes (Signed)
At [place] I was notified that my patient heart rate went down in the 20's patient found  Unresponsive. CPR started. A Code blue was called right away. Code team arrived. IV epi pushed twice, IV Calcium pushed once. Patient was intubated. Spontaneous pulse, Patient was transferred to ICU. Tried to contact Wiota Foushee(Son/legal guardian) twice. Voicemail identified that it was Meade Maw and I left  A message requesting to call back to the hospital.

## 2019-01-05 NOTE — Anesthesia Preprocedure Evaluation (Signed)
Anesthesia Evaluation  Patient identified by MRN, date of birth, ID band Patient awake    Reviewed: Allergy & Precautions, NPO status , Patient's Chart, lab work & pertinent test results, reviewed documented beta blocker date and time   Airway Mallampati: II  TM Distance: >3 FB     Dental  (+) Chipped   Pulmonary pneumonia, unresolved,           Cardiovascular hypertension,      Neuro/Psych  Neuromuscular disease    GI/Hepatic   Endo/Other    Renal/GU      Musculoskeletal   Abdominal   Peds  Hematology   Anesthesia Other Findings Cardiac arrest this am with CPR, epi etc. Aspiration pneumonia. Renal failure with GFR 17. Increased troponin. EKKG shows PVCs and Rbbb. Hb 12.8. BP this am 66/36. Sats 60%. Will be prepared for CPR.  Reproductive/Obstetrics                             Anesthesia Physical Anesthesia Plan  ASA: V  Anesthesia Plan: General   Post-op Pain Management:    Induction: Intravenous  PONV Risk Score and Plan:   Airway Management Planned: Oral ETT  Additional Equipment:   Intra-op Plan:   Post-operative Plan:   Informed Consent: I have reviewed the patients History and Physical, chart, labs and discussed the procedure including the risks, benefits and alternatives for the proposed anesthesia with the patient or authorized representative who has indicated his/her understanding and acceptance.       Plan Discussed with: CRNA  Anesthesia Plan Comments:         Anesthesia Quick Evaluation

## 2019-01-05 NOTE — ED Provider Notes (Signed)
----------------------------------------- 2:00 AM on 12/22/2018 -----------------------------------------  [facility] Department of Emergency Medicine   Code Blue CONSULT NOTE  Chief Complaint: Cardiac arrest/unresponsive   Level V Caveat: Unresponsive  History of present illness: I was contacted by the hospital for a CODE BLUE cardiac arrest upstairs and presented to the patient's bedside.  Upon arrival CPR in progress.  Nurse was in the room was able to give me a brief history of the patient admitted for sepsis currently receiving IV antibiotics, had been showing signs of decreased responsiveness, then became bradycardic around 20 bpm and became unresponsive and lost pulses.  CPR was started immediately.  ROS: Unable to obtain, Level V caveat  Scheduled Meds: . carbidopa-levodopa  2 tablet Oral TID  . heparin  5,000 Units Subcutaneous Q8H  . hydrocerin   Topical BID  . polyvinyl alcohol  1 drop Both Eyes Q4H  . rotigotine  1 patch Transdermal Daily   Continuous Infusions: . sodium chloride 250 mL (01/04/2019 0019)  . ampicillin-sulbactam (UNASYN) IV 3 g (12/14/2018 0019)   PRN Meds:.sodium chloride, acetaminophen **OR** acetaminophen, nitroGLYCERIN, ondansetron **OR** ondansetron (ZOFRAN) IV Past Medical History:  Diagnosis Date  . BPH (benign prostatic hypertrophy)   . Hyperlipidemia   . Parkinson's disease    Past Surgical History:  Procedure Laterality Date  . FINGER CONTRACTURE RELEASE  1980   right, left 4th finger release 8/10   Social History   Socioeconomic History  . Marital status: Divorced    Spouse name: Not on file  . Number of children: 2  . Years of education: Not on file  . Highest education level: Not on file  Occupational History  . 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 Financial resource strain: Not on file  . Food insecurity    Worry: Not on file     Inability: Not on file  . Transportation needs    Medical: Not on file    Non-medical: Not on file  Tobacco Use  . Smoking status: Never Smoker  . Smokeless tobacco: Never Used  Substance and Sexual Activity  . Alcohol use: Not Currently    Comment: beer  . Drug use: Not Currently  . Sexual activity: Not on file  Lifestyle  . Physical activity    Days per week: Not on file    Minutes per session: Not on file  . Stress: Not on file  Relationships  . Social Herbalist on phone: Not on file    Gets together: Not on file    Attends religious service: Not on file    Active member of club or organization: Not on file    Attends meetings of clubs or organizations: Not on file    Relationship status: Not on file  . Intimate partner violence    Fear of current or ex partner: Not on file    Emotionally abused: Not on file    Physically abused: Not on file    Forced sexual activity: Not on file  Other Topics Concern  . Not on file  Social History Narrative   [place]      Has a living will "somewhere"   Requests son [name] to make decisions for him--now has health care POA   Would accept resuscitation attempts   Probably would want tube feeds   No Known Allergies  Last set of  Vital Signs (not current) Vitals:   01/04/19 1604 01/04/19 1940  BP: (!) 116/56 (!) 104/50  Pulse: 82 82  Resp: 20 16  Temp: 99.4 F (37.4 C) 98.6 F (37 C)  SpO2: 97% 96%      Physical Exam Gen: unresponsive Cardiovascular: pulseless Resp: apneic. Breath sounds equal bilaterally with bagging  Abd: nondistended  Neuro: GCS 3, unresponsive to pain  HEENT: No blood in posterior pharynx, gag reflex absent  Neck: No crepitus  Musculoskeletal: No deformity  Skin: warm  Procedures (when applicable, including Critical Care time):  INTUBATION Performed by: Minna Antis  Required items: required blood products, implants, devices, and special equipment  available Patient identity confirmed: provided demographic data and hospital-assigned identification number Time out: Immediately prior to procedure a "time out" was called to verify the correct patient, procedure, equipment, support staff and site/side marked as required.  Indications: Cardiac arrest  Intubation method: S4 Glidescope Laryngoscopy   Preoxygenation: 100% BVM  No medications used/needed.  Tube Size: 8.0 cuffed  Post-procedure assessment: chest rise and ETCO2 monitor Breath sounds: equal and absent over the epigastrium.  Visualize tube passing through the vocal cords on glide scope monitor. Tube secured with: ETT holder  Patient tolerated the procedure well with no immediate complications.   Cardiopulmonary Resuscitation (CPR) Procedure Note Directed/Performed by: Minna Antis I personally directed ancillary staff and/or performed CPR in an effort to regain return of spontaneous circulation and to maintain cardiac, neuro and systemic perfusion.   CRITICAL CARE Performed by: Minna Antis   Total critical care time: 20 minutes  Critical care time was exclusive of separately billable procedures and treating other patients.  Critical care was necessary to treat or prevent imminent or life-threatening deterioration.  Critical care was time spent personally by me on the following activities: development of treatment plan with patient and/or surrogate as well as nursing, discussions with consultants, evaluation of patient's response to treatment, examination of patient, obtaining history from patient or surrogate, ordering and performing treatments and interventions, ordering and review of laboratory studies, ordering and review of radiographic studies, pulse oximetry and re-evaluation of patient's condition.      MDM / Assessment and Plan Arrived to the patient's bedside with CPR in progress.  Patient has been admitted for sepsis with antibiotics hanging.  I  directed the resuscitation efforts and CPR for approximately 20 minutes.  Multiple rounds of CPR, epinephrine was given x2, calcium x1.  Several pulse checks showed asystole on the monitor with no pulse palpated.  After multiple rounds of CPR and medications administered as listed above on pulse check patient was found to have a radial pulse with electrical activity on the monitor.  I then intubated the patient for airway protection using a glide scope.  No medications needed.  After airway was secured and patient had regained spontaneous pulse, care was transitioned to the hospitalist Dr. Arville Care.    Minna Antis, MD 12/21/2018 [phone]

## 2019-01-06 ENCOUNTER — Encounter: Payer: Self-pay | Admitting: General Surgery

## 2019-01-06 ENCOUNTER — Inpatient Hospital Stay (HOSPITAL_COMMUNITY)
Admit: 2019-01-06 | Discharge: 2019-01-06 | Disposition: A | Discharge status: Home / Self Care | Payer: Medicare Other | Attending: Critical Care Medicine | Admitting: Critical Care Medicine

## 2019-01-06 DIAGNOSIS — J9601 Acute respiratory failure with hypoxia: Secondary | ICD-10-CM

## 2019-01-06 DIAGNOSIS — I469 Cardiac arrest, cause unspecified: Secondary | ICD-10-CM

## 2019-01-06 LAB — BLOOD GAS, ARTERIAL
Acid-base deficit: 13.1 mmol/L — ABNORMAL HIGH (ref 0.0–2.0)
Bicarbonate: 10.8 mmol/L — ABNORMAL LOW (ref 20.0–28.0)
FIO2: 0.3
MECHVT: 500 mL
Mechanical Rate: 18
O2 Saturation: 98.4 %
PEEP: 5 cmH2O
Patient temperature: 37
RATE: 18 resp/min
pCO2 arterial: 21 mmHg — ABNORMAL LOW (ref 32.0–48.0)
pH, Arterial: 7.32 — ABNORMAL LOW (ref 7.350–7.450)
pO2, Arterial: 120 mmHg — ABNORMAL HIGH (ref 83.0–108.0)

## 2019-01-06 LAB — ECHOCARDIOGRAM COMPLETE
Height: 66 in
Weight: 2601.6 oz

## 2019-01-06 LAB — CBC WITH DIFFERENTIAL/PLATELET
Abs Immature Granulocytes: 0 10*3/uL (ref 0.00–0.07)
Basophils Absolute: 0 10*3/uL (ref 0.0–0.1)
Basophils Relative: 0 %
Eosinophils Absolute: 0 10*3/uL (ref 0.0–0.5)
Eosinophils Relative: 0 %
HCT: 32 % — ABNORMAL LOW (ref 39.0–52.0)
Hemoglobin: 10.8 g/dL — ABNORMAL LOW (ref 13.0–17.0)
Lymphocytes Relative: 8 %
Lymphs Abs: 1.5 10*3/uL (ref 0.7–4.0)
MCH: 31.3 pg (ref 26.0–34.0)
MCHC: 33.8 g/dL (ref 30.0–36.0)
MCV: 92.8 fL (ref 80.0–100.0)
Monocytes Absolute: 0 10*3/uL — ABNORMAL LOW (ref 0.1–1.0)
Monocytes Relative: 0 %
Neutro Abs: 16.8 10*3/uL — ABNORMAL HIGH (ref 1.7–7.7)
Neutrophils Relative %: 92 %
Platelets: 187 10*3/uL (ref 150–400)
RBC: 3.45 MIL/uL — ABNORMAL LOW (ref 4.22–5.81)
RDW: 14.8 % (ref 11.5–15.5)
WBC: 18.3 10*3/uL — ABNORMAL HIGH (ref 4.0–10.5)
nRBC: 0.2 % (ref 0.0–0.2)

## 2019-01-06 LAB — BASIC METABOLIC PANEL
Anion gap: 12 (ref 5–15)
BUN: 106 mg/dL — ABNORMAL HIGH (ref 8–23)
CO2: 13 mmol/L — ABNORMAL LOW (ref 22–32)
Calcium: 6.4 mg/dL — CL (ref 8.9–10.3)
Chloride: 122 mmol/L — ABNORMAL HIGH (ref 98–111)
Creatinine, Ser: 3.84 mg/dL — ABNORMAL HIGH (ref 0.61–1.24)
GFR calc Af Amer: 16 mL/min — ABNORMAL LOW (ref >60)
GFR calc non Af Amer: 14 mL/min — ABNORMAL LOW (ref >60)
Glucose, Bld: 251 mg/dL — ABNORMAL HIGH (ref 70–99)
Potassium: 3.2 mmol/L — ABNORMAL LOW (ref 3.5–5.1)
Sodium: 147 mmol/L — ABNORMAL HIGH (ref 135–145)

## 2019-01-06 LAB — GLUCOSE, CAPILLARY
Glucose-Capillary: 118 mg/dL — ABNORMAL HIGH (ref 70–99)
Glucose-Capillary: 246 mg/dL — ABNORMAL HIGH (ref 70–99)
Glucose-Capillary: 257 mg/dL — ABNORMAL HIGH (ref 70–99)
Glucose-Capillary: 285 mg/dL — ABNORMAL HIGH (ref 70–99)
Glucose-Capillary: 303 mg/dL — ABNORMAL HIGH (ref 70–99)
Glucose-Capillary: 330 mg/dL — ABNORMAL HIGH (ref 70–99)
Glucose-Capillary: 349 mg/dL — ABNORMAL HIGH (ref 70–99)
Glucose-Capillary: 43 mg/dL — CL (ref 70–99)

## 2019-01-06 MED ORDER — CALCIUM GLUCONATE-NACL 1-0.675 GM/50ML-% IV SOLN
1.0000 g | Freq: Once | INTRAVENOUS | Status: AC
  Administered 2019-01-07: 1000 mg via INTRAVENOUS
  Filled 2019-01-06: qty 50

## 2019-01-06 MED ORDER — POLYVINYL ALCOHOL 1.4 % OP SOLN
1.0000 [drp] | OPHTHALMIC | Status: DC | PRN
  Filled 2019-01-06: qty 15

## 2019-01-06 MED ORDER — HYDROCORTISONE NA SUCCINATE PF 100 MG IJ SOLR
50.0000 mg | Freq: Four times a day (QID) | INTRAMUSCULAR | Status: DC
  Administered 2019-01-06 – 2019-01-08 (×8): 50 mg via INTRAVENOUS
  Filled 2019-01-06 (×8): qty 2

## 2019-01-06 MED ORDER — POTASSIUM CHLORIDE 10 MEQ/50ML IV SOLN
10.0000 meq | INTRAVENOUS | Status: AC
  Administered 2019-01-07 (×2): 10 meq via INTRAVENOUS
  Filled 2019-01-06 (×2): qty 50

## 2019-01-06 MED ORDER — CHLORHEXIDINE GLUCONATE CLOTH 2 % EX PADS
6.0000 | MEDICATED_PAD | Freq: Every day | CUTANEOUS | Status: DC
  Administered 2019-01-06 – 2019-01-07 (×2): 6 via TOPICAL

## 2019-01-06 MED ORDER — INSULIN ASPART 100 UNIT/ML ~~LOC~~ SOLN
0.0000 [IU] | SUBCUTANEOUS | Status: DC
  Administered 2019-01-06 (×2): 3 [IU] via SUBCUTANEOUS
  Administered 2019-01-07: 09:00:00 2 [IU] via SUBCUTANEOUS
  Administered 2019-01-07: 04:00:00 3 [IU] via SUBCUTANEOUS
  Administered 2019-01-07 – 2019-01-08 (×3): 1 [IU] via SUBCUTANEOUS
  Filled 2019-01-06 (×9): qty 1

## 2019-01-06 MED ORDER — SODIUM CHLORIDE 0.9 % IV SOLN: 1.0000 g | Freq: Once | INTRAVENOUS | Status: DC

## 2019-01-06 NOTE — Progress Notes (Signed)
CRITICAL CARE NOTE  SYNOPSIS 78 yo WM with acute and severe acidosis from acute gastric ulcer perforation with severe septic shock and multiorgan failure s/p cardiac arrest      CC  follow up respiratory failure  SUBJECTIVE Patient remains critically ill Prognosis is guarded On vasopressors On full vent support    BP 120/67   Pulse 68   Temp (!) 100.9 F (38.3 C) (Axillary)   Resp (!) 25   Ht 5\' 6"  (1.676 m)   Wt 73.8 kg   SpO2 100%   BMI 26.24 kg/m    I/O last 3 completed shifts: In: 8324.8 [I.V.:5824.6; IV Piggyback:2500.3] Out: 9937 [Drains:570; Other:1200] No intake/output data recorded.  SpO2: 100 % FiO2 (%): 40 %   SIGNIFICANT EVENTS 10/19 admitted for SOB and CP therapy for pneumonia increased LA 10/22 worsening dysphagia and SOB 10/25 cardiac arrest 20 mins, distended abd, aspiration pneumonia 10/25 CT Abd Pelvis revealed nasogastric tube tip extends beyond the margin of the wall of the gastric fundus and resides in the peritoneal cavity. Large volume of pneumoperitoneum and free fluid identified within the abdomen and pelvis. Findings are compatible with bowel perforation 10/25 Findings:  1. Large quantity of bilious and turbid fluid identified (2250 mL). 2. Pre pyloric 1 cm ulcer identified and able to be closed and patched.  3. Patient did not deteriorated during surgery   REVIEW OF SYSTEMS  PATIENT IS UNABLE TO PROVIDE COMPLETE REVIEW OF SYSTEMS DUE TO SEVERE CRITICAL ILLNESS   PHYSICAL EXAMINATION:  GENERAL:critically ill appearing, +resp distress HEAD: Normocephalic, atraumatic.  EYES: Pupils equal, round, reactive to light.  No scleral icterus.  MOUTH: Moist mucosal membrane. NECK: Supple.  PULMONARY: +rhonchi, +wheezing CARDIOVASCULAR: S1 and S2. Regular rate and rhythm. No murmurs, rubs, or gallops.  GASTROINTESTINAL: Soft, nontender, -distended. No masses. Positive bowel sounds. No hepatosplenomegaly.  MUSCULOSKELETAL: No swelling,  clubbing, or edema.  NEUROLOGIC: obtunded, GCS<8 SKIN:intact,warm,dry  MEDICATIONS: I have reviewed all medications and confirmed regimen as documented   CULTURE RESULTS   Recent Results (from the past 240 hour(s))  Blood Culture (routine x 2)     Status: None   Collection Time: 12/23/2018  4:18 PM   Specimen: BLOOD  Result Value Ref Range Status   Specimen Description BLOOD LEFT ANTECUBITAL  Final   Special Requests   Final    BOTTLES DRAWN AEROBIC AND ANAEROBIC Blood Culture adequate volume   Culture   Final    NO GROWTH 5 DAYS Performed at [facility], [address], [place], [place] [address]    Report Status 01/04/2019 FINAL  Final  Blood Culture (routine x 2)     Status: None   Collection Time: 12/24/2018  4:23 PM   Specimen: BLOOD  Result Value Ref Range Status   Specimen Description BLOOD BLOOD RIGHT FOREARM  Final   Special Requests   Final    BOTTLES DRAWN AEROBIC AND ANAEROBIC Blood Culture adequate volume   Culture   Final    NO GROWTH 5 DAYS Performed at [facility], [address] [place], [place] [address]    Report Status 01/04/2019 FINAL  Final  SARS CORONAVIRUS 2 (TAT 6-24 HRS) Nasopharyngeal Nasopharyngeal Swab     Status: None   Collection Time: 12/18/2018  4:45 PM   Specimen: Nasopharyngeal Swab  Result Value Ref Range Status   SARS Coronavirus 2 NEGATIVE NEGATIVE Final    Comment: (NOTE) SARS-CoV-2 target nucleic acids are NOT DETECTED. The SARS-CoV-2 RNA is generally  detectable in upper and lower respiratory specimens during the acute phase of infection. Negative results do not preclude SARS-CoV-2 infection, do not rule out co-infections with other pathogens, and should not be used as the sole basis for treatment or other patient management decisions. Negative results must be combined with clinical observations, patient history, and epidemiological information. The expected result is Negative. Fact Sheet for  Patients: HairSlick.nohttps://www.fda.gov/media/138098/download Fact Sheet for Healthcare Providers: quierodirigir.comhttps://www.fda.gov/media/138095/download This test is not yet approved or cleared by the [place] FDA and  has been authorized for detection and/or diagnosis of SARS-CoV-2 by FDA under an Emergency Use Authorization (EUA). This EUA will remain  in effect (meaning this test can be used) for the duration of the COVID-19 declaration under Section 56 4(b)(1) of the Act, 21 U.S.C. section 360bbb-3(b)(1), unless the authorization is terminated or revoked sooner. Performed at [facility], [address] [place], [place] [address]   MRSA PCR Screening     Status: None   Collection Time: 12/31/18  8:42 AM   Specimen: Nasal Mucosa; Nasopharyngeal  Result Value Ref Range Status   MRSA by PCR NEGATIVE NEGATIVE Final    Comment:        The GeneXpert MRSA Assay (FDA approved for NASAL specimens only), is one component of a comprehensive MRSA colonization surveillance program. It is not intended to diagnose MRSA infection nor to guide or monitor treatment for MRSA infections. Performed at [facility], [address] [place], [place] [address]   MRSA PCR Screening     Status: None   Collection Time: June 24, 2018  2:10 AM   Specimen: Nasal Mucosa; Nasopharyngeal  Result Value Ref Range Status   MRSA by PCR NEGATIVE NEGATIVE Final    Comment:        The GeneXpert MRSA Assay (FDA approved for NASAL specimens only), is one component of a comprehensive MRSA colonization surveillance program. It is not intended to diagnose MRSA infection nor to guide or monitor treatment for MRSA infections. Performed at [facility], [address] [place], [place] [address]   CULTURE, BLOOD (ROUTINE X 2) w Reflex to ID Panel     Status: None (Preliminary result)   Collection Time: June 24, 2018  4:41 AM   Specimen: BLOOD  Result Value Ref Range Status   Specimen Description BLOOD LEFT  ANTECUBITAL  Final   Special Requests   Final    BOTTLES DRAWN AEROBIC AND ANAEROBIC Blood Culture adequate volume   Culture   Final    NO GROWTH <12 HOURS Performed at [facility], [address] [place], [place] [address]    Report Status PENDING  Incomplete  CULTURE, BLOOD (ROUTINE X 2) w Reflex to ID Panel     Status: None (Preliminary result)   Collection Time: June 24, 2018  4:41 AM   Specimen: BLOOD  Result Value Ref Range Status   Specimen Description BLOOD LEFT FOREARM  Final   Special Requests   Final    BOTTLES DRAWN AEROBIC AND ANAEROBIC Blood Culture adequate volume   Culture   Final    NO GROWTH <12 HOURS Performed at [facility], [address] [place], [place] [address]    Report Status PENDING  Incomplete          IMAGING    Dg Chest Port 1 View  Result Date: April 13, 202020 CLINICAL DATA:  Unsuccessful attempt at left IJ catheter placement. Rule out PTX EXAM: PORTABLE CHEST 1 VIEW COMPARISON:  April 13, 202020 FINDINGS: ET tube  tip is above the carina. There is a enteric tube with tip below the field of view. No pneumothorax following attempted IJ catheter placement. Normal heart size. Decreased lung volumes. Small bilateral pleural effusions noted with overlying compressive type atelectasis. IMPRESSION: 1. No pneumothorax after attempted IJ catheter placement. 2. Small bilateral pleural effusions with overlying compressive type atelectasis. 3. The enteric tube tip is above the carina. Electronically Signed   By: Signa Kell M.D.   On: 2019-01-24 08:34   CBC    Component Value Date/Time   WBC 8.3 January 24, 2019 0441   RBC 4.16 (L) 01-24-2019 0441   HGB 12.8 (L) 24-Jan-2019 0441   HCT 40.1 Jan 24, 2019 0441   PLT 203 01/24/2019 0441   MCV 96.4 01/24/2019 0441   MCH 30.8 01/24/19 0441   MCHC 31.9 2019-01-24 0441   RDW 14.6 01/24/19 0441   LYMPHSABS 0.4 (L) 2019-01-24 0441   MONOABS 0.2 January 24, 2019 0441   EOSABS 0.0 01/24/2019 0441   BASOSABS 0.1  01/24/2019 0441   BMP Latest Ref Rng & Units 01/24/2019 01/01/2019 12/31/2018  Glucose 70 - 99 mg/dL 357(S) 177(L) 390(Z)  BUN 8 - 23 mg/dL 009(Q) 33(A) 07(M)  Creatinine 0.61 - 1.24 mg/dL 2.26(J) 3.35(K) 5.62(B)  Sodium 135 - 145 mmol/L 155(H) 145 143  Potassium 3.5 - 5.1 mmol/L 4.0 4.2 5.0  Chloride 98 - 111 mmol/L 125(H) 116(H) 114(H)  CO2 22 - 32 mmol/L 16(L) 20(L) 17(L)  Calcium 8.9 - 10.3 mg/dL 6.3(S) 9.3(T) 8.3(L)       Indwelling Urinary Catheter continued, requirement due to   Reason to continue Indwelling Urinary Catheter strict Intake/Output monitoring for hemodynamic instability   Central Line/ continued, requirement due to  Reason to continue [employer] Monitoring of central venous pressure or other hemodynamic parameters and poor IV access   Ventilator continued, requirement due to severe respiratory failure   Ventilator Sedation RASS 0 to -2      ASSESSMENT AND PLAN SYNOPSIS  78 yo WM with acute and severe acidosis from acute gastric ulcer perforation with severe septic shock and multiorgan failure s/p cardiac arrest  Severe ACUTE Hypoxic and Hypercapnic Respiratory Failure -continue Full MV support -continue Bronchodilator Therapy -Wean Fio2 and PEEP as tolerated  Septic shock -use vasopressors to keep MAP>65 -follow ABG and LA -follow up cultures -emperic ABX - stress dose steroids -aggressive IV fluid Resuscitation  ACUTE KIDNEY INJURY/Renal Failure -follow chem 7 -follow UO -continue Foley Catheter-assess need -Avoid nephrotoxic agents -Recheck creatinine    NEUROLOGY - intubated and sedated - minimal sedation to achieve a RASS goal: -1   CARDIAC ICU monitoring  ID -continue IV abx as prescibed -follow up cultures  GI GI PROPHYLAXIS as indicated  NUTRITIONAL STATUS DIET-->NPO Constipation protocol as indicated  ENDO - will use ICU hypoglycemic\Hyperglycemia protocol if indicated   ELECTROLYTES -follow labs as  needed -replace as needed -pharmacy consultation and following   DVT/GI PRX ordered TRANSFUSIONS AS NEEDED MONITOR FSBS ASSESS the need for LABS as needed   Critical Care Time devoted to patient care services described in this note is 35 minutes.   Overall, patient is critically ill, prognosis is guarded.  Patient with Multiorgan failure and at high risk for cardiac arrest and death.   Recommend DNR status and comfort care  measures  Lucie Leather, M.D.  [facility]  Medical Director [facility] Medical Director [facility] Cardio-Pulmonary Department

## 2019-01-06 NOTE — Progress Notes (Signed)
[facility] Hospital Day(s): 7.   Post op day(s): 1 Day Post-Op.   Interval History: Patient seen and examined, no acute events or new complaints overnight.  Patient continue critically ill sedated on mechanical ventilation.  As per nurse, the son came to see the patient in he was decided to be DO NOT RESUSCITATE.  Otherwise there have been no significant change in the clinical status.  Vital signs in last 24 hours: [min-max] current  Temp:  [97.8 F (36.6 C)-100.9 F (38.3 C)] 100.4 F (38 C) (10/26 1600) Pulse Rate:  [28-106] 51 (10/26 1600) Resp:  [21-31] 25 (10/26 1600) BP: (81-131)/(26-113) 96/53 (10/26 1600) SpO2:  [96 %-100 %] 100 % (10/26 1600) Arterial Line BP: (102-165)/(38-102) 112/46 (10/26 1600) FiO2 (%):  [30 %-100 %] 30 % (10/26 1510)     Height: 5\' 6"  (167.6 cm) Weight: 73.8 kg BMI (Calculated): 26.26   Physical Exam:  Constitutional: Critically ill, sedated Respiratory: On mechanical ventilation Cardiovascular: Tachycardic Gastrointestinal: Mild distended, wound dry and clean.  Labs:  CBC Latest Ref Rng & Units Jan 07, 2019 01/01/2019 12/31/2018  WBC 4.0 - 10.5 K/uL 8.3 6.6 6.9  Hemoglobin 13.0 - 17.0 g/dL 12.8(L) 13.7 13.8  Hematocrit 39.0 - 52.0 % 40.1 40.5 41.0  Platelets 150 - 400 K/uL 203 153 168   CMP Latest Ref Rng & Units January 07, 2019 01/01/2019 12/31/2018  Glucose 70 - 99 mg/dL 131(H) 118(H) 102(H)  BUN 8 - 23 mg/dL 107(H) 74(H) 57(H)  Creatinine 0.61 - 1.24 mg/dL 3.31(H) 2.13(H) 1.89(H)  Sodium 135 - 145 mmol/L 155(H) 145 143  Potassium 3.5 - 5.1 mmol/L 4.0 4.2 5.0  Chloride 98 - 111 mmol/L 125(H) 116(H) 114(H)  CO2 22 - 32 mmol/L 16(L) 20(L) 17(L)  Calcium 8.9 - 10.3 mg/dL 8.4(L) 8.0(L) 8.3(L)  Total Protein 6.5 - 8.1 g/dL 5.0(L) - -  Total Bilirubin 0.3 - 1.2 mg/dL 0.7 - -  Alkaline Phos 38 - 126 U/L 66 - -  AST 15 - 41 U/L 90(H) - -  ALT 0 - 44 U/L 6 - -    Imaging studies: No new pertinent imaging  studies   Assessment/Plan:  78 y.o. male with gastric ulcer perforation 1 Day Post-Op s/p repair of perforation and omental patch placement, complicated by pertinent comorbidities including severe Parkinson, advanced age, recent cardiorespiratory arrest, dysphagia, aspiration pneumonia, respiratory failure on mechanical ventilation, septic shock. Patient critically ill [place] ventilation.  He has been tolerating surgery without significant deterioration.  Still very poor prognosis.  From surgery standpoint I agree with current critical care management.  I would recommend to keep n.p.o. and with nasogastric tube to low intermittent suction.  Patient will continue management by critical care.  I will continue following closely with physical exams.    Arnold Long, MD

## 2019-01-06 NOTE — Progress Notes (Signed)
Pharmacy Antibiotic Note  Alan Peterson is a 78 y.o. male admitted on 12/12/2018 with aspiration pneumonia and intra-abdominal infection.  Pharmacy has been consulted for Zosyn dosing.  Plan: -The dose of Zosyn 3.375g IV q8h will be adjusted to 3.375g IV q12h based on renal function.  -Continue Zosyn 3.375g IV q12h, day 2; SCr: 3.31 -Continue Anidulafungin 100mg  IV daily, day 2  Height: 5\' 6"  (167.6 cm) Weight: 162 lb 9.6 oz (73.8 kg) IBW/kg (Calculated) : 63.8  Temp (24hrs), Avg:99.1 F (37.3 C), Min:97.7 F (36.5 C), Max:100.9 F (38.3 C)  Recent Labs  Lab 01/02/2019 1517 01/07/2019 1531 01/06/2019 1723 12/31/18 0001 12/31/18 0446 12/31/18 0658 01/01/19 0531 12/27/2018 0441  WBC 5.1  --   --   --  6.9  --  6.6 8.3  CREATININE 1.96*  --   --   --  1.89*  --  2.13* 3.31*  LATICACIDVEN  --  6.2* 4.5* 2.8*  --  1.9  --  2.4*    Estimated Creatinine Clearance: 16.1 mL/min (A) (by C-G formula based on SCr of 3.31 mg/dL (H)).    No Known Allergies  Antimicrobials this admission: Unasyn 10/20 >> 10/25 Zosyn 10/25 >>  Anidulafungin 10/25 >>  Microbiology results: 10/25 BCx: No growth x1 day, final pending  10/25 Sputum: RARE WBC PRESENT, PREDOMINANTLY PMN MODERATE YEAST,final pending  10/25 MRSA PCR: Negative 10/20 MRSA PCR: Negative 10/19 SARS-CoV-2: Negative 10/19 BCx: No growth x5 day, final   Thank you for allowing pharmacy to be a part of this patient's care.  Sallye Lat, PharmD Candidate 01/06/2019 11:10 AM

## 2019-01-06 NOTE — Progress Notes (Signed)
Initial Nutrition Assessment  DOCUMENTATION CODES:   Not applicable  INTERVENTION:  Will monitor for plan of care and goals of care.  If patient becomes stable enough for tube feeds recommend initiating Vital 1.5 Cal at 20 mL/hr and advancing by 15 mL/hr every 12 hours to goal rate of 50 mL/hr. Also provide Pro-Stat 30 mL BID per tube. Goal regimen provides 2000 kcal, 111 grams of protein, 912 mL H2O daily.  NUTRITION DIAGNOSIS:   Inadequate oral intake related to inability to eat as evidenced by NPO status.  GOAL:   Provide needs based on ASPEN/SCCM guidelines  MONITOR:   Vent status, Labs, Weight trends, Skin, I & O's  REASON FOR ASSESSMENT:   Ventilator    ASSESSMENT:   78 year old male with PMHx of Parkinson's disease, HLD, BPH admitted on 10/19 with sepsis, PNA, atypical chest pain, found to have dysphagia, suffered cardiac arrest on 10/25 and respiratory failure likely related to aspiration requiring intubation, also found to have perforated pre-pyloric ulcer s/p repair and placement of G-tube on 10/25.   Patient intubated. On PRVC mode with FiO2 30% and PEEP 5 cmH2O. Abdomen soft per RN documentation. He had a smear BM today. Patient has an OGT to LIS and a G-tube that is clamped. Noted in chart patient has a history of dysphagia. His weight has been trending down over several years. He was 87.2 kg in 2017 and on 10/21 was 71.6 kg (157.8 lbs). RD suspects patient is malnourished but it was difficult to assess many areas of subcutaneous fat loss due to edema.  Enteral Access: 18 Fr. OGT placed 10/25; according to CT Abd/Pelvis yesterday tube tip exited gastric fundus and resided in peritoneal cavity but that was prior to surgery to repair perforated ulcer (no x-rays from today); 68 cm at corner of mouth Pt also with G-tube placed during surgery on 10/25  MAP: 69-83 mmHg  Patient is currently intubated on ventilator support Ve: 13.8 L/min Temp (24hrs), Avg:99.7 F (37.6  C), Min:97.8 F (36.6 C), Max:100.9 F (38.3 C)  Propofol: N/A  Medications reviewed and include: Solu-Cortef 50 mg Q6hrs IV, D5W at 100 mL/hr, famotidine, phenylephrine gtt at 220 mcg/min, norepinephrine gtt at 2 mcg/min, Zosyn.  Labs reviewed: CBG 43-349. On 10/25 Sodium 155, Chloride 125, CO2 16, BUN 107, Creatinine 3.31, Phosphorus 5.7, Magnesium 2.7.  I/O: 0 mL UOP  Discussed with RN.  NUTRITION - FOCUSED PHYSICAL EXAM:    Most Recent Value  Orbital Region  Moderate depletion  Upper Arm Region  Unable to assess [edema]  Thoracic and Lumbar Region  Unable to assess  Buccal Region  Unable to assess  Temple Region  Moderate depletion  Clavicle Bone Region  Moderate depletion  Clavicle and Acromion Bone Region  Moderate depletion  Scapular Bone Region  Unable to assess  Dorsal Hand  Unable to assess [edema]  Patellar Region  Mild depletion  Anterior Thigh Region  Mild depletion  Posterior Calf Region  Mild depletion  Edema (RD Assessment)  Mild  Hair  Reviewed  Eyes  Unable to assess  Mouth  Unable to assess  Skin  Reviewed  Nails  Reviewed     Diet Order:   Diet Order    None     EDUCATION NEEDS:   No education needs have been identified at this time  Skin:  Skin Assessment: Skin Integrity Issues:(wound right buttocks (1cm x 1cm); MSAD to groin and coccyx; closed incision to abdomen)  Last BM:  01/06/2019 - smear type 6  Height:   Ht Readings from Last 1 Encounters:  12/14/2018 5\' 6"  (1.676 m)   Weight:   Wt Readings from Last 1 Encounters:  01/04/19 73.8 kg   Ideal Body Weight:  64.5 kg  BMI:  Body mass index is 26.24 kg/m.  Estimated Nutritional Needs:   Kcal:  1931 (PSU 2003b w/ MSJ 1374, Ve 13.8, Tmax 38.3)  Protein:  95-115 grams (1.3-1.6 grams/kg)  Fluid:  >/= 1.8 L/day  Willey Blade, MS, RD, LDN Office: [phone] Pager: [phone] After Hours/Weekend Pager: [phone]

## 2019-01-06 NOTE — Progress Notes (Signed)
[place] at [facility]                                                                                                                                                                                  Patient Demographics   Alan Peterson, is a 78 y.o. male, DOB - Nov 18, 1940, KXF:[id number]  Admit date - 12/31/2018   Admitting Physician Saundra Shelling, MD  Outpatient Primary MD for the patient is [name], MD   LOS - 7  Subjective: Patient remains on the ventilator multiorgan failure on pressors  Review of Systems:   CONSTITUTIONAL: Intubated Vitals:   Vitals:   01/06/19 0900 01/06/19 1000 01/06/19 1100 01/06/19 1113  BP: (!) 89/59 (!) 104/54 96/60   Pulse: (!) 59 88 84   Resp: (!) 23 (!) 25 (!) 25   Temp:      TempSrc:      SpO2: 100% 100% 100% 100%  Weight:      Height:        Wt Readings from Last 3 Encounters:  01/04/19 73.8 kg  12/07/17 70.8 kg  06/04/17 73.5 kg     Intake/Output Summary (Last 24 hours) at 01/06/2019 1230 Last data filed at 01/06/2019 1200 Gross per 24 hour  Intake 6337.42 ml  Output 560 ml  Net 5777.42 ml    Physical Exam:   GENERAL: Critically ill  HEAD, EYES, EARS, NOSE AND THROAT: Atraumatic, normocephalic.  Pupils equal and reactive to light. Sclerae anicteric. No conjunctival injection. No oro-pharyngeal erythema.  NECK: Supple. There is no jugular venous distention. No bruits, no lymphadenopathy, no thyromegaly.  HEART: Regular rate and rhythm,. No murmurs, no rubs, no clicks.  LUNGS: Clear to auscultation bilaterally. No rales or rhonchi. No wheezes.  ABDOMEN: Postop EXTREMITIES: No evidence of any cyanosis, clubbing, or peripheral edema.  +2 pedal and radial pulses bilaterally.  NEUROLOGIC: Sedated.  SKIN: Moist and warm with no rashes appreciated.  Psych: Sedated LN: No inguinal LN enlargement    Antibiotics   Anti-infectives (From admission, onward)   Start     Dose/Rate Route Frequency  Ordered Stop   01/06/19 1000  anidulafungin (ERAXIS) 100 mg in sodium chloride 0.9 % 100 mL IVPB     100 mg 78 mL/hr over 100 Minutes Intravenous Every 24 hours 12/17/2018 1146     01/03/2019 1300  anidulafungin (ERAXIS) 200 mg in sodium chloride 0.9 % 200 mL IVPB     200 mg 78 mL/hr over 200 Minutes Intravenous  Once 12/14/2018 1146 12/28/2018 1742   12/12/2018 1230  anidulafungin (ERAXIS) 100 mg in sodium chloride 0.9 % 100 mL IVPB  Status:  Discontinued  100 mg 78 mL/hr over 100 Minutes Intravenous Every 24 hours 01/07/2019 1132 12/24/2018 1146   01/11/2019 1145  piperacillin-tazobactam (ZOSYN) IVPB 3.375 g  Status:  Discontinued     3.375 g 12.5 mL/hr over 240 Minutes Intravenous Every 8 hours 12/12/2018 1132 12/31/2018 1136   01/11/2019 1145  piperacillin-tazobactam (ZOSYN) IVPB 3.375 g     3.375 g 12.5 mL/hr over 240 Minutes Intravenous Every 12 hours 12/23/2018 1136     12/31/18 1600  ceFEPIme (MAXIPIME) 2 g in sodium chloride 0.9 % 100 mL IVPB  Status:  Discontinued     2 g 200 mL/hr over 30 Minutes Intravenous Every 24 hours 01/07/2019 1858 12/31/18 1158   12/31/18 1207  Ampicillin-Sulbactam (UNASYN) 3 g in sodium chloride 0.9 % 100 mL IVPB  Status:  Discontinued     3 g 200 mL/hr over 30 Minutes Intravenous Every 12 hours 12/31/18 1207 12/16/2018 0616   12/31/18 1200  Ampicillin-Sulbactam (UNASYN) 3 g in sodium chloride 0.9 % 100 mL IVPB  Status:  Discontinued     3 g 200 mL/hr over 30 Minutes Intravenous Every 6 hours 12/31/18 1158 12/31/18 1207   12/19/2018 1849  vancomycin variable dose per unstable renal function (pharmacist dosing)  Status:  Discontinued      Does not apply See admin instructions 01/10/2019 1858 12/31/18 1158   12/14/2018 1800  vancomycin (VANCOCIN) 500 mg in sodium chloride 0.9 % 100 mL IVPB     500 mg 100 mL/hr over 60 Minutes Intravenous  Once 01/04/2019 1628 12/18/2018 1941   01/03/2019 1630  ceFEPIme (MAXIPIME) 2 g in sodium chloride 0.9 % 100 mL IVPB     2 g 200 mL/hr over 30 Minutes  Intravenous  Once 12/14/2018 1617 12/27/2018 1701   01/04/2019 1630  vancomycin (VANCOCIN) IVPB 1000 mg/200 mL premix     1,000 mg 200 mL/hr over 60 Minutes Intravenous  Once 12/19/2018 1617 12/19/2018 1838      Medications   Scheduled Meds: . chlorhexidine gluconate (MEDLINE KIT)  15 mL Mouth Rinse BID  . Chlorhexidine Gluconate Cloth  6 each Topical Daily  . heparin  5,000 Units Subcutaneous Q8H  . hydrocerin   Topical BID  . hydrocortisone sod succinate (SOLU-CORTEF) inj  50 mg Intravenous Q6H  . mouth rinse  15 mL Mouth Rinse 10 times per day  . polyvinyl alcohol  1 drop Both Eyes Q4H  . rotigotine  1 patch Transdermal Daily   Continuous Infusions: . sodium chloride Stopped (01/10/2019 0136)  . anidulafungin    . dextrose Stopped (01/06/19 1040)  . DOPamine Stopped (12/14/2018 1304)  . epinephrine Stopped (12/23/2018 1303)  . famotidine (PEPCID) IV Stopped (01/06/19 0325)  . fentaNYL infusion INTRAVENOUS    . norepinephrine (LEVOPHED) Adult infusion 10 mcg/min (12/14/2018 1927)  . phenylephrine (NEO-SYNEPHRINE) Adult infusion 220 mcg/min (01/06/19 0551)  . piperacillin-tazobactam (ZOSYN)  IV 12.5 mL/hr at 01/06/19 1200  . vasopressin (PITRESSIN) infusion - *FOR SHOCK* 0.03 Units/min (12/14/2018 1600)   PRN Meds:.sodium chloride, fentaNYL, midazolam, [DISCONTINUED] ondansetron **OR** ondansetron (ZOFRAN) IV   Data Review:   Micro Results Recent Results (from the past 240 hour(s))  Blood Culture (routine x 2)     Status: None   Collection Time: 12/17/2018  4:18 PM   Specimen: BLOOD  Result Value Ref Range Status   Specimen Description BLOOD LEFT ANTECUBITAL  Final   Special Requests   Final    BOTTLES DRAWN AEROBIC AND ANAEROBIC Blood Culture adequate volume   Culture  Final    NO GROWTH 5 DAYS Performed at [facility], [address] [place], [place] [address]    Report Status 01/04/2019 FINAL  Final  Blood Culture (routine x 2)     Status: None   Collection Time:  12/13/2018  4:23 PM   Specimen: BLOOD  Result Value Ref Range Status   Specimen Description BLOOD BLOOD RIGHT FOREARM  Final   Special Requests   Final    BOTTLES DRAWN AEROBIC AND ANAEROBIC Blood Culture adequate volume   Culture   Final    NO GROWTH 5 DAYS Performed at [facility], [address], [place], [place] [address]    Report Status 01/04/2019 FINAL  Final  SARS CORONAVIRUS 2 (TAT 6-24 HRS) Nasopharyngeal Nasopharyngeal Swab     Status: None   Collection Time: 12/31/2018  4:45 PM   Specimen: Nasopharyngeal Swab  Result Value Ref Range Status   SARS Coronavirus 2 NEGATIVE NEGATIVE Final    Comment: (NOTE) SARS-CoV-2 target nucleic acids are NOT DETECTED. The SARS-CoV-2 RNA is generally detectable in upper and lower respiratory specimens during the acute phase of infection. Negative results do not preclude SARS-CoV-2 infection, do not rule out co-infections with other pathogens, and should not be used as the sole basis for treatment or other patient management decisions. Negative results must be combined with clinical observations, patient history, and epidemiological information. The expected result is Negative. Fact Sheet for Patients: SugarRoll.be Fact Sheet for Healthcare Providers: https://www.woods-mathews.com/ This test is not yet approved or cleared by the [place] FDA and  has been authorized for detection and/or diagnosis of SARS-CoV-2 by FDA under an Emergency Use Authorization (EUA). This EUA will remain  in effect (meaning this test can be used) for the duration of the COVID-19 declaration under Section 56 4(b)(1) of the Act, 21 U.S.C. section 360bbb-3(b)(1), unless the authorization is terminated or revoked sooner. Performed at [facility], [place] [address] [place], [place] [address]   MRSA PCR Screening     Status: None   Collection Time: 12/31/18  8:42 AM   Specimen: Nasal Mucosa;  Nasopharyngeal  Result Value Ref Range Status   MRSA by PCR NEGATIVE NEGATIVE Final    Comment:        The GeneXpert MRSA Assay (FDA approved for NASAL specimens only), is one component of a comprehensive MRSA colonization surveillance program. It is not intended to diagnose MRSA infection nor to guide or monitor treatment for MRSA infections. Performed at [facility], [address] [place], [place] [address]   MRSA PCR Screening     Status: None   Collection Time: 12/29/2018  2:10 AM   Specimen: Nasal Mucosa; Nasopharyngeal  Result Value Ref Range Status   MRSA by PCR NEGATIVE NEGATIVE Final    Comment:        The GeneXpert MRSA Assay (FDA approved for NASAL specimens only), is one component of a comprehensive MRSA colonization surveillance program. It is not intended to diagnose MRSA infection nor to guide or monitor treatment for MRSA infections. Performed at [facility], [address] [place], [place] [address]   Culture, respiratory (non-expectorated)     Status: None (Preliminary result)   Collection Time: 12/17/2018  3:49 AM   Specimen: Tracheal Aspirate; Respiratory  Result Value Ref Range Status   Specimen Description   Final    TRACHEAL ASPIRATE Performed at [facility], [address], [place], [place] [address]    Special  Requests   Final    NONE Performed at [facility], [address] [place], [place] [address]    Gram Stain   Final    RARE WBC PRESENT, PREDOMINANTLY PMN MODERATE YEAST    Culture   Final    CULTURE REINCUBATED FOR BETTER GROWTH Performed at [facility], [place] [address] [place], [place] [address]    Report Status PENDING  Incomplete  CULTURE, BLOOD (ROUTINE X 2) w Reflex to ID Panel     Status: None (Preliminary result)   Collection Time: 12/28/2018  4:41 AM   Specimen: BLOOD  Result Value Ref Range Status   Specimen Description BLOOD LEFT ANTECUBITAL  Final   Special Requests    Final    BOTTLES DRAWN AEROBIC AND ANAEROBIC Blood Culture adequate volume   Culture   Final    NO GROWTH 1 DAY Performed at [facility], [address] [place], [place] [address]    Report Status PENDING  Incomplete  CULTURE, BLOOD (ROUTINE X 2) w Reflex to ID Panel     Status: None (Preliminary result)   Collection Time: 01/02/2019  4:41 AM   Specimen: BLOOD  Result Value Ref Range Status   Specimen Description BLOOD LEFT FOREARM  Final   Special Requests   Final    BOTTLES DRAWN AEROBIC AND ANAEROBIC Blood Culture adequate volume   Culture   Final    NO GROWTH 1 DAY Performed at [facility], [address] [place], [place] [address]    Report Status PENDING  Incomplete    Radiology Reports Ct Abdomen Pelvis Wo Contrast  Result Date: 12/29/2018 CLINICAL DATA:  Abdominal distention. EXAM: CT ABDOMEN AND PELVIS WITHOUT CONTRAST TECHNIQUE: Multidetector CT imaging of the abdomen and pelvis was performed following the standard protocol without IV contrast. COMPARISON:  Earlier today FINDINGS: Lower chest: Bilateral pleural effusions identified left greater than right. There is also subsegmental atelectasis overlying bilateral effusions. Three vessel coronary artery atherosclerotic calcifications. Aortic atherosclerosis. Hepatobiliary: No focal liver abnormality is seen. No gallstones, gallbladder wall thickening, or biliary dilatation. Pancreas: Unremarkable. No pancreatic ductal dilatation or surrounding inflammatory changes. Spleen: Normal in size without focal abnormality. Adrenals/Urinary Tract: Normal appearance of the adrenal glands. Bilateral nephrolithiasis identified. There is left renal atrophy. New from previous exam. Left-sided hydronephrosis and hydroureter is identified. Stone within the left ureter at the approximate L5-S1 level measures 8 mm, image 55/2. There are 2 small stones within the dependent portion of the bladder measuring up to 4 mm.  Stomach/Bowel: The nasogastric tube tip extends beyond the margin of the wall of the gastric fundus and resides in the peritoneal cavity. Extensive pneumoperitoneum is identified. Additionally, free fluid with dilute extravasated enteric contrast material is identified within the peritoneal cavity of the abdomen and pelvis. There is increase caliber of the proximal small bowel loops which measure up to 4.2 cm. Multiple small bowel fluid levels are identified. The appendix is visualized and appears normal. Normal appearance of the colon. Vascular/Lymphatic: Aortic atherosclerosis. No aneurysm. No abdominopelvic adenopathy. Reproductive: Prostate gland is enlarged containing calcifications and a focal area of low-density in the right posterior gland. Other: Large volume pneumoperitoneum, free fluid, and extravasated enteric contrast material. Musculoskeletal: No acute or significant osseous findings. IMPRESSION: 1. The nasogastric tube tip extends beyond the margin of the wall of the gastric fundus and resides in the peritoneal cavity. Large volume of pneumoperitoneum and free fluid identified within the abdomen and pelvis. Findings are  compatible with bowel perforation. 2. Left-sided hydronephrosis and hydroureter secondary to 8 mm left ureteral calculus. 3. Three vessel coronary artery calcifications noted. 4. Bilateral pleural effusions and overlying subsegmental atelectasis. 5. Enlarged prostate gland with focal area of low-density within the right posterior gland. Correlate for any clinical signs or symptoms of prostatitis. 6. Left renal atrophy. 7. Bilateral nephrolithiasis. Aortic Atherosclerosis (ICD10-I70.0). Electronically Signed   By: Kerby Moors M.D.   On: 12/19/2018 06:47   Dg Abd 1 View  Result Date: 12/22/2018 CLINICAL DATA:  78 year old male status post enteric tube placement. EXAM: PORTABLE CHEST 1 VIEW COMPARISON:  Chest radiograph dated 12/21/2018 FINDINGS: Endotracheal tube approximately 4  cm above the carina. An enteric tube extends into the left upper abdomen with tip likely in the stomach. Probable small left pleural effusion and associated atelectatic changes of the left lung base. No focal consolidation or pneumothorax. Mild cardiomegaly. There is apparent outlining of the bowel wall with air (Rigler's sign) concerning for pneumoperitoneum. Further evaluation with CT is recommended. There is degenerative changes of the spine. IMPRESSION: 1. Endotracheal tube above the carina. 2. Enteric tube with tip in the stomach. 3. Findings concerning for pneumoperitoneum. Further evaluation with CT is recommended. 4. Probable small left pleural effusion and left lung base atelectasis. These results were called by telephone at the time of interpretation on 12/16/2018 at 3:47 am to provider [facility] , who verbally acknowledged these results. Electronically Signed   By: Anner Crete M.D.   On: 01/02/2019 03:50   Ct Head Wo Contrast  Result Date: 01/02/2019 CLINICAL DATA:  Altered level of consciousness and weakness EXAM: CT HEAD WITHOUT CONTRAST TECHNIQUE: Contiguous axial images were obtained from the base of the skull through the vertex without intravenous contrast. COMPARISON:  08/24/2017 FINDINGS: Brain: Considerable motion artifact is identified with severe limitations on the exam. There is persistent increased density along the anterior cranial fossa particularly on the left similar to that seen on the prior exam. Considerable beam hardening artifact is identified limiting the examination although the persistent density is suspicious for underlying meningioma. If the patient can undergo MRI examination that would be helpful. Diffuse atrophic changes are seen. Chronic white matter ischemic changes are noted. No findings to suggest acute hemorrhage or acute infarction are seen. Vascular: No hyperdense vessel or unexpected calcification. Skull: Normal. Negative for fracture or focal lesion.  Sinuses/Orbits: No acute finding. Other: Stable fluid within the mastoid air cells on the left. IMPRESSION: Chronic atrophic and ischemic changes stable from previous exam. Persistent increased density is noted along the base of the anterior cranial fossa particularly on the left. Although this is likely related to underlying beam hardening artifact the possibility of a meningioma could not be totally excluded. MRI would be helpful in this regard. Electronically Signed   By: Inez Catalina M.D.   On: 01/02/2019 10:54   Dg Chest Port 1 View  Result Date: 12/26/2018 CLINICAL DATA:  Unsuccessful attempt at left IJ catheter placement. Rule out PTX EXAM: PORTABLE CHEST 1 VIEW COMPARISON:  12/24/2018 FINDINGS: ET tube tip is above the carina. There is a enteric tube with tip below the field of view. No pneumothorax following attempted IJ catheter placement. Normal heart size. Decreased lung volumes. Small bilateral pleural effusions noted with overlying compressive type atelectasis. IMPRESSION: 1. No pneumothorax after attempted IJ catheter placement. 2. Small bilateral pleural effusions with overlying compressive type atelectasis. 3. The enteric tube tip is above the carina. Electronically Signed   By: Lovena Le  Clovis Riley M.D.   On: 01/09/2019 08:34   Dg Chest Port 1 View  Result Date: 12/25/2018 CLINICAL DATA:  78 year old male status post enteric tube placement. EXAM: PORTABLE CHEST 1 VIEW COMPARISON:  Chest radiograph dated 01/11/2019 FINDINGS: Endotracheal tube approximately 4 cm above the carina. An enteric tube extends into the left upper abdomen with tip likely in the stomach. Probable small left pleural effusion and associated atelectatic changes of the left lung base. No focal consolidation or pneumothorax. Mild cardiomegaly. There is apparent outlining of the bowel wall with air (Rigler's sign) concerning for pneumoperitoneum. Further evaluation with CT is recommended. There is degenerative changes of the  spine. IMPRESSION: 1. Endotracheal tube above the carina. 2. Enteric tube with tip in the stomach. 3. Findings concerning for pneumoperitoneum. Further evaluation with CT is recommended. 4. Probable small left pleural effusion and left lung base atelectasis. These results were called by telephone at the time of interpretation on 12/15/2018 at 3:47 am to provider [facility] , who verbally acknowledged these results. Electronically Signed   By: Anner Crete M.D.   On: 12/26/2018 03:50   Dg Chest Portable 1 View  Result Date: 01/04/2019 CLINICAL DATA:  Chest pain, shortness of breath. EXAM: PORTABLE CHEST 1 VIEW COMPARISON:  None. FINDINGS: The heart size and mediastinal contours are within normal limits. Right lung is clear. No pneumothorax is noted. Mild left basilar atelectasis is noted with probable small left pleural effusion. The visualized skeletal structures are unremarkable. IMPRESSION: Mild left basilar subsegmental atelectasis with probable small left pleural effusion. Electronically Signed   By: Marijo Conception M.D.   On: 01/06/2019 15:52     CBC Recent Labs  Lab 12/26/2018 1517 12/31/18 0446 01/01/19 0531 12/26/2018 0441  WBC 5.1 6.9 6.6 8.3  HGB 15.4 13.8 13.7 12.8*  HCT 45.9 41.0 40.5 40.1  PLT 231 168 153 203  MCV 94.8 92.8 92.9 96.4  MCH 31.8 31.2 31.4 30.8  MCHC 33.6 33.7 33.8 31.9  RDW 12.9 13.1 13.2 14.6  LYMPHSABS  --   --   --  0.4*  MONOABS  --   --   --  0.2  EOSABS  --   --   --  0.0  BASOSABS  --   --   --  0.1    Chemistries  Recent Labs  Lab 12/22/2018 1517 12/31/18 0446 01/01/19 0531 12/14/2018 0441  NA 141 143 145 155*  K 4.9 5.0 4.2 4.0  CL 108 114* 116* 125*  CO2 18* 17* 20* 16*  GLUCOSE 196* 102* 118* 131*  BUN 40* 57* 74* 107*  CREATININE 1.96* 1.89* 2.13* 3.31*  CALCIUM 9.5 8.3* 8.0* 8.4*  MG  --   --   --  2.7*  AST  --   --   --  90*  ALT  --   --   --  6  ALKPHOS  --   --   --  66  BILITOT  --   --   --  0.7    ------------------------------------------------------------------------------------------------------------------ estimated creatinine clearance is 16.1 mL/min (A) (by C-G formula based on SCr of 3.31 mg/dL (H)). ------------------------------------------------------------------------------------------------------------------ No results for input(s): HGBA1C in the last 72 hours. ------------------------------------------------------------------------------------------------------------------ No results for input(s): CHOL, HDL, LDLCALC, TRIG, CHOLHDL, LDLDIRECT in the last 72 hours. ------------------------------------------------------------------------------------------------------------------ No results for input(s): TSH, T4TOTAL, T3FREE, THYROIDAB in the last 72 hours.  Invalid input(s): FREET3 ------------------------------------------------------------------------------------------------------------------ No results for input(s): VITAMINB12, FOLATE, FERRITIN, TIBC, IRON, RETICCTPCT in the last 72  hours.  Coagulation profile No results for input(s): INR, PROTIME in the last 168 hours.  No results for input(s): DDIMER in the last 72 hours.  Cardiac Enzymes No results for input(s): CKMB, TROPONINI, MYOGLOBIN in the last 168 hours.  Invalid input(s): CK ------------------------------------------------------------------------------------------------------------------ Invalid input(s): POCBNP    Assessment & Plan    78 yr old male patient with a known history of parkinsons disease, BPH , hyperlipidemia presents to ER with generalized weakness and difficulty breathing. Patient also complaints of chest discomfort on deep breathing..   -Acute respiratory failure with needing cardiac resuscitation Current therapy prognosis very poor Extubate when extubated  -Sepsis Continue broad-spectrum antibiotics with Zosyn    -Hypotension  continue pressors    -Status post surgery  status post gastric ulcer repair with open gGastrostomy tube    -Parkinson's with worsening has been seen by neurology Per son patient's father was doing well prior to admissions   -Dysphagia patient has severe dysphagia has pocketing his food.  No intention for feeding tube   Prognosis very poor in this 78 year old with worsening Parkinson's now on the ventilator multiple pressors status post surgery         Code Status Orders  (From admission, onward)         Start     Ordered   12/17/2018 2021  Full code  Continuous     12/21/2018 2020        Code Status History    This patient has a current code status but no historical code status.   Advance Care Planning Activity    Advance Directive Documentation     Most Recent Value  Type of Advance Directive  Living will  Pre-existing out of facility DNR order (yellow form or pink MOST form)  -  "MOST" Form in Place?  -           Consults none  DVT Prophylaxis SCDs Lab Results  Component Value Date   PLT 203 12/29/2018     Time Spent in minutes 35 minutes  Greater than 50% of time spent    Dustin Flock M.D on 01/06/2019 at 12:30 PM  Between 7am to 6pm - Pager - [phone]  After 6pm go to [email or web address] - Proofreader  [employer]   Office  [phone]

## 2019-01-06 NOTE — Progress Notes (Signed)
Subjective: Patient intubated and sedated.  With severe septic shock and multiorgan failure.  Objective: Current vital signs: BP (!) 105/57   Pulse 93   Temp 100.2 F (37.9 C)   Resp (!) 26   Ht _0  (1.676 m)   Wt 73.8 kg   SpO2 100%   BMI 26.24 kg/m  Vital signs in last 24 hours: Temp:  [97.7 F (36.5 C)-100.9 F (38.3 C)] 100.2 F (37.9 C) (10/26 0800) Pulse Rate:  [28-106] 93 (10/26 0800) Resp:  [21-31] 26 (10/26 0800) BP: (43-152)/(26-113) 105/57 (10/26 0800) SpO2:  [96 %-100 %] 100 % (10/26 0800) Arterial Line BP: (102-177)/(38-102) 126/47 (10/26 0800) FiO2 (%):  [30 %-100 %] 30 % (10/26 0817)  Intake/Output from previous day: 10/25 0701 - 10/26 0700 In: 6677.9 [I.V.:5398.5; IV Piggyback:1279.4] Out: 1770 [Drains:570] Intake/Output this shift: No intake/output data recorded. Nutritional status:  Diet Order    None      Neurologic Exam: Mental Status: Patient does not respond to verbal stimuli.  Does not respond to deep sternal rub.  Does not follow commands.  No verbalizations noted.  Cranial Nerves: II: patient does not respond confrontation bilaterally, pupils right 3 mm, left sightly larger,and reactive bilaterally III,IV,VI: Oculocephalic response present bilaterally.  V,VII: corneal reflex present bilaterally  VIII: patient does not respond to verbal stimuli IX,X: gag reflex unable to test, XI: trapezius strength unable to test bilaterally XII: tongue strength unable to test Motor: Extremities flaccid throughout.  No spontaneous movement noted.  No purposeful movements noted. Sensory: Does not respond to noxious stimuli in any extremity.   Lab Results: Basic Metabolic Panel: Recent Labs  Lab 12/16/2018 1517 12/31/18 0446 01/01/19 0531 12/16/2018 0441  NA 141 143 145 155*  K 4.9 5.0 4.2 4.0  CL 108 114* 116* 125*  CO2 18* 17* 20* 16*  GLUCOSE 196* 102* 118* 131*  BUN 40* 57* 74* 107*  CREATININE 1.96* 1.89* 2.13* 3.31*  CALCIUM 9.5 8.3*  8.0* 8.4*  MG  --   --   --  2.7*  PHOS  --   --   --  5.7*    Liver Function Tests: Recent Labs  Lab 01/07/2019 0441  AST 90*  ALT 6  ALKPHOS 66  BILITOT 0.7  PROT 5.0*  ALBUMIN 2.1*   No results for input(s): LIPASE, AMYLASE in the last 168 hours. No results for input(s): AMMONIA in the last 168 hours.  CBC: Recent Labs  Lab 12/14/2018 1517 12/31/18 0446 01/01/19 0531 12/25/2018 0441  WBC 5.1 6.9 6.6 8.3  NEUTROABS  --   --   --  7.7  HGB 15.4 13.8 13.7 12.8*  HCT 45.9 41.0 40.5 40.1  MCV 94.8 92.8 92.9 96.4  PLT 231 168 153 203    Cardiac Enzymes: No results for input(s): CKTOTAL, CKMB, CKMBINDEX, TROPONINI in the last 168 hours.  Lipid Panel: No results for input(s): CHOL, TRIG, HDL, CHOLHDL, VLDL, LDLCALC in the last 168 hours.  CBG: Recent Labs  Lab 12/15/2018 1957 01/01/2019 2357 01/06/19 0031 01/06/19 0407 01/06/19 0809  GLUCAP 228* 37* 349* 58* 330*    Microbiology: Results for orders placed or performed during the hospital encounter of 12/16/2018  Blood Culture (routine x 2)     Status: None   Collection Time: 12/18/2018  4:18 PM   Specimen: BLOOD  Result Value Ref Range Status   Specimen Description BLOOD LEFT ANTECUBITAL  Final   Special Requests   Final    BOTTLES DRAWN  AEROBIC AND ANAEROBIC Blood Culture adequate volume   Culture   Final    NO GROWTH 5 DAYS Performed at [facility], [address] [place], [place] [address]    Report Status 01/04/2019 FINAL  Final  Blood Culture (routine x 2)     Status: None   Collection Time: 12/22/2018  4:23 PM   Specimen: BLOOD  Result Value Ref Range Status   Specimen Description BLOOD BLOOD RIGHT FOREARM  Final   Special Requests   Final    BOTTLES DRAWN AEROBIC AND ANAEROBIC Blood Culture adequate volume   Culture   Final    NO GROWTH 5 DAYS Performed at [facility], [address], [place], [place] [address]    Report Status 01/04/2019 FINAL  Final  SARS CORONAVIRUS 2 (TAT 6-24  HRS) Nasopharyngeal Nasopharyngeal Swab     Status: None   Collection Time: 12/27/2018  4:45 PM   Specimen: Nasopharyngeal Swab  Result Value Ref Range Status   SARS Coronavirus 2 NEGATIVE NEGATIVE Final    Comment: (NOTE) SARS-CoV-2 target nucleic acids are NOT DETECTED. The SARS-CoV-2 RNA is generally detectable in upper and lower respiratory specimens during the acute phase of infection. Negative results do not preclude SARS-CoV-2 infection, do not rule out co-infections with other pathogens, and should not be used as the sole basis for treatment or other patient management decisions. Negative results must be combined with clinical observations, patient history, and epidemiological information. The expected result is Negative. Fact Sheet for Patients: SugarRoll.be Fact Sheet for Healthcare Providers: https://www.woods-mathews.com/ This test is not yet approved or cleared by the [place] FDA and  has been authorized for detection and/or diagnosis of SARS-CoV-2 by FDA under an Emergency Use Authorization (EUA). This EUA will remain  in effect (meaning this test can be used) for the duration of the COVID-19 declaration under Section 56 4(b)(1) of the Act, 21 U.S.C. section 360bbb-3(b)(1), unless the authorization is terminated or revoked sooner. Performed at [facility], [place] [address] [place], [place] [address]   MRSA PCR Screening     Status: None   Collection Time: 12/31/18  8:42 AM   Specimen: Nasal Mucosa; Nasopharyngeal  Result Value Ref Range Status   MRSA by PCR NEGATIVE NEGATIVE Final    Comment:        The GeneXpert MRSA Assay (FDA approved for NASAL specimens only), is one component of a comprehensive MRSA colonization surveillance program. It is not intended to diagnose MRSA infection nor to guide or monitor treatment for MRSA infections. Performed at [facility], [address] [place],  [place] [address]   MRSA PCR Screening     Status: None   Collection Time: 12/19/2018  2:10 AM   Specimen: Nasal Mucosa; Nasopharyngeal  Result Value Ref Range Status   MRSA by PCR NEGATIVE NEGATIVE Final    Comment:        The GeneXpert MRSA Assay (FDA approved for NASAL specimens only), is one component of a comprehensive MRSA colonization surveillance program. It is not intended to diagnose MRSA infection nor to guide or monitor treatment for MRSA infections. Performed at [facility], [address] [place], [place] [address]   Culture, respiratory (non-expectorated)     Status: None (Preliminary result)   Collection Time: 12/14/2018  3:49 AM   Specimen: Tracheal Aspirate; Respiratory  Result Value Ref Range Status   Specimen Description   Final    TRACHEAL ASPIRATE Performed at [facility], [address] [place], [place] [address]    Special Requests   Final    NONE Performed at [facility], [place], [place] [address]    Gram Stain   Final    RARE WBC PRESENT, PREDOMINANTLY PMN MODERATE YEAST    Culture   Final    NO GROWTH 1 DAY Performed at [facility] [address] [place], [place] [address]    Report Status PENDING  Incomplete  CULTURE, BLOOD (ROUTINE X 2) w Reflex to ID Panel     Status: None (Preliminary result)   Collection Time: 12/14/2018  4:41 AM   Specimen: BLOOD  Result Value Ref Range Status   Specimen Description BLOOD LEFT ANTECUBITAL  Final   Special Requests   Final    BOTTLES DRAWN AEROBIC AND ANAEROBIC Blood Culture adequate volume   Culture   Final    NO GROWTH 1 DAY Performed at [facility], [address], [place], [place] [address]    Report Status PENDING  Incomplete  CULTURE, BLOOD (ROUTINE X 2) w Reflex to ID Panel     Status: None (Preliminary result)   Collection Time: 12/20/2018  4:41 AM   Specimen: BLOOD  Result Value Ref Range Status   Specimen Description BLOOD LEFT  FOREARM  Final   Special Requests   Final    BOTTLES DRAWN AEROBIC AND ANAEROBIC Blood Culture adequate volume   Culture   Final    NO GROWTH 1 DAY Performed at [facility], [address] [place],  [address]    Report Status PENDING  Incomplete    Coagulation Studies: No results for input(s): LABPROT, INR in the last 72 hours.  Imaging: Ct Abdomen Pelvis Wo Contrast  Result Date: 12/27/2018 CLINICAL DATA:  Abdominal distention. EXAM: CT ABDOMEN AND PELVIS WITHOUT CONTRAST TECHNIQUE: Multidetector CT imaging of the abdomen and pelvis was performed following the standard protocol without IV contrast. COMPARISON:  Earlier today FINDINGS: Lower chest: Bilateral pleural effusions identified left greater than right. There is also subsegmental atelectasis overlying bilateral effusions. Three vessel coronary artery atherosclerotic calcifications. Aortic atherosclerosis. Hepatobiliary: No focal liver abnormality is seen. No gallstones, gallbladder wall thickening, or biliary dilatation. Pancreas: Unremarkable. No pancreatic ductal dilatation or surrounding inflammatory changes. Spleen: Normal in size without focal abnormality. Adrenals/Urinary Tract: Normal appearance of the adrenal glands. Bilateral nephrolithiasis identified. There is left renal atrophy. New from previous exam. Left-sided hydronephrosis and hydroureter is identified. Stone within the left ureter at the approximate L5-S1 level measures 8 mm, image 55/2. There are 2 small stones within the dependent portion of the bladder measuring up to 4 mm. Stomach/Bowel: The nasogastric tube tip extends beyond the margin of the wall of the gastric fundus and resides in the peritoneal cavity. Extensive pneumoperitoneum is identified. Additionally, free fluid with dilute extravasated enteric contrast material is identified within the peritoneal cavity of the abdomen and pelvis. There is increase caliber of the proximal small bowel loops  which measure up to 4.2 cm. Multiple small bowel fluid levels are identified. The appendix is visualized and appears normal. Normal appearance of the colon. Vascular/Lymphatic: Aortic atherosclerosis. No aneurysm. No abdominopelvic adenopathy. Reproductive: Prostate gland is enlarged containing calcifications and a focal area of low-density in the right posterior gland. Other: Large volume pneumoperitoneum, free fluid, and extravasated enteric contrast material. Musculoskeletal: No acute or significant osseous findings. IMPRESSION: 1. The nasogastric tube tip extends beyond the margin of the wall of  the gastric fundus and resides in the peritoneal cavity. Large volume of pneumoperitoneum and free fluid identified within the abdomen and pelvis. Findings are compatible with bowel perforation. 2. Left-sided hydronephrosis and hydroureter secondary to 8 mm left ureteral calculus. 3. Three vessel coronary artery calcifications noted. 4. Bilateral pleural effusions and overlying subsegmental atelectasis. 5. Enlarged prostate gland with focal area of low-density within the right posterior gland. Correlate for any clinical signs or symptoms of prostatitis. 6. Left renal atrophy. 7. Bilateral nephrolithiasis. Aortic Atherosclerosis (ICD10-I70.0). Electronically Signed   By: Kerby Moors M.D.   On: 12/23/2018 06:47   Dg Abd 1 View  Result Date: 12/21/2018 CLINICAL DATA:  78 year old male status post enteric tube placement. EXAM: PORTABLE CHEST 1 VIEW COMPARISON:  Chest radiograph dated 12/25/2018 FINDINGS: Endotracheal tube approximately 4 cm above the carina. An enteric tube extends into the left upper abdomen with tip likely in the stomach. Probable small left pleural effusion and associated atelectatic changes of the left lung base. No focal consolidation or pneumothorax. Mild cardiomegaly. There is apparent outlining of the bowel wall with air (Rigler's sign) concerning for pneumoperitoneum. Further evaluation with  CT is recommended. There is degenerative changes of the spine. IMPRESSION: 1. Endotracheal tube above the carina. 2. Enteric tube with tip in the stomach. 3. Findings concerning for pneumoperitoneum. Further evaluation with CT is recommended. 4. Probable small left pleural effusion and left lung base atelectasis. These results were called by telephone at the time of interpretation on 01/02/2019 at 3:47 am to provider [facility] , who verbally acknowledged these results. Electronically Signed   By: Anner Crete M.D.   On: 12/12/2018 03:50   Dg Chest Port 1 View  Result Date: 12/26/2018 CLINICAL DATA:  Unsuccessful attempt at left IJ catheter placement. Rule out PTX EXAM: PORTABLE CHEST 1 VIEW COMPARISON:  12/28/2018 FINDINGS: ET tube tip is above the carina. There is a enteric tube with tip below the field of view. No pneumothorax following attempted IJ catheter placement. Normal heart size. Decreased lung volumes. Small bilateral pleural effusions noted with overlying compressive type atelectasis. IMPRESSION: 1. No pneumothorax after attempted IJ catheter placement. 2. Small bilateral pleural effusions with overlying compressive type atelectasis. 3. The enteric tube tip is above the carina. Electronically Signed   By: Kerby Moors M.D.   On: 01/04/2019 08:34   Dg Chest Port 1 View  Result Date: 12/20/2018 CLINICAL DATA:  78 year old male status post enteric tube placement. EXAM: PORTABLE CHEST 1 VIEW COMPARISON:  Chest radiograph dated 12/18/2018 FINDINGS: Endotracheal tube approximately 4 cm above the carina. An enteric tube extends into the left upper abdomen with tip likely in the stomach. Probable small left pleural effusion and associated atelectatic changes of the left lung base. No focal consolidation or pneumothorax. Mild cardiomegaly. There is apparent outlining of the bowel wall with air (Rigler's sign) concerning for pneumoperitoneum. Further evaluation with CT is recommended. There is  degenerative changes of the spine. IMPRESSION: 1. Endotracheal tube above the carina. 2. Enteric tube with tip in the stomach. 3. Findings concerning for pneumoperitoneum. Further evaluation with CT is recommended. 4. Probable small left pleural effusion and left lung base atelectasis. These results were called by telephone at the time of interpretation on 12/14/2018 at 3:47 am to provider [facility] , who verbally acknowledged these results. Electronically Signed   By: Anner Crete M.D.   On: 12/17/2018 03:50    Medications:  I have reviewed the patient's current medications. Prior to Admission:  Medications Prior to Admission  Medication Sig Dispense Refill Last Dose  . carbidopa-levodopa (SINEMET IR) 25-100 MG tablet TAKE 1 TABLET BY MOUTH THREE TIMES A DAY (Patient taking differently: Take 1 tablet by mouth 3 (three) times daily. ) 270 tablet 3 12/19/2018 at 0700  . silver sulfADIAZINE (SILVADENE) 1 % cream APPLY TO AFFECTED AREA EVERY DAY (Patient taking differently: Apply 1 application topically daily as needed (skin irritations). ) 50 g 1 Unknown at PRN  . cephALEXin (KEFLEX) 500 MG capsule Take 1 capsule (500 mg total) by mouth 3 (three) times daily. (Patient not taking: Reported on 01/06/2019) 15 capsule 1 Not Taking at Unknown time   Scheduled: . chlorhexidine gluconate (MEDLINE KIT)  15 mL Mouth Rinse BID  . Chlorhexidine Gluconate Cloth  6 each Topical Daily  . heparin  5,000 Units Subcutaneous Q8H  . hydrocerin   Topical BID  . hydrocortisone sod succinate (SOLU-CORTEF) inj  200 mg Intravenous BID  . mouth rinse  15 mL Mouth Rinse 10 times per day  . polyvinyl alcohol  1 drop Both Eyes Q4H  . rotigotine  1 patch Transdermal Daily    Assessment/Plan: 78 year old male admitted with weakness and difficulty swallowing.  History of PD.  Currently intubated and sedated.  Septic with evidence of multiorgan failure.  Prognosis poor for functional recovery.  Patient has been made  DNR.   LOS: 7 days   Alexis Goodell, MD Neurology [phone] 01/06/2019  10:38 AM

## 2019-01-06 NOTE — Progress Notes (Signed)
*  PRELIMINARY RESULTS* Echocardiogram 2D Echocardiogram has been performed.  Sherrie Sport 01/06/2019, 8:51 AM

## 2019-01-06 NOTE — Progress Notes (Signed)
Palliative:  HPI:78 y.o.malewith past medical history of Parkinson's disease, BPH, HLDadmitted on 10/01/2020with SOB, chest pain, weakness withlikelyaspiration pneumonia.He continues to decline with worsening swallow function during hospitalization. He had decline with perforated gastric ulcer s/p surgical intervention 01/01/2019 along with cardiac arrest. Currently intubated in ICU with multiorgan failure.   I met today at Alan Peterson bedside who is intubated and on vasopressors. He is not responsive to me today. He has had an unfortunate decline over the weekend with gastric ulcer perforation and cardiac arrest. Prognosis is extremely poor.   I spoke with son, Alan Peterson, who actually called me. He understands poor prognosis and he tells me that he is trying to prepare himself and knows his father is at EOL. His priority at this time is to ensure that his father's body is entrusted as an anatomical gift and his hope is that his body can be used to learn about Parkinson's disease and help people in the future. I did discuss briefly with [name] extubation to comfort care but Alan Peterson would like to ensure that the anatomical gift is settled prior to extubation and death. He provided me with [employer] representative to reach out too in the morning to assist with anatomical gift (he said that she was unavailable this afternoon to call). I will reach out to Alan Peterson tomorrow to further discuss comfort care after speaking with [facility] rep.   All questions/concerns addressed. Emotional support provided.   Exam: Unresponsive. On vent 30% Fio2. Breathing regular, unlabored. Abd soft, JPs to suction. Extremities warm to touch.   Plan: - Will speak with son further tomorrow regarding comfort care and one way extubation.  - Discussed liberalization of visitor policy for up to 4 people per EOL guidelines with RN and AC. [place], and [place] to be allowed visitation.   [place], NP Palliative Medicine  Team Pager [phone] (Please see [email or web address] for schedule) Team Phone [phone]    Greater than 50%  of this time was spent counseling and coordinating care related to the above assessment and plan

## 2019-01-06 NOTE — Progress Notes (Signed)
At approximately 2030 patient was noted to be having tonic-clonic like seizure activity with facial twitching.  E-link button pressed and notified.  They suggested we tried to use Versed as it was a benzo.  Versed given without any change in status.  E-link then suggested that the movements appeared to be related to his history of Parkinson's disease and being off of his current medications.  No further instructions or orders were received at this time.  Patient continues to have these types of movements.

## 2019-01-06 NOTE — Progress Notes (Signed)
Clinical status relayed to family-the Son Josealberto Montalto)  Updated and notified of patients medical condition-  Progressive multiorgan failure with very low chance of meaningful recovery.  Patient is in dying  Process.  Family understands the situation.  They have consented and agreed to DNR.    Corrin Parker, M.D.  [facility]  Medical Director [place] Director [facility] Cardio-Pulmonary Department

## 2019-01-07 ENCOUNTER — Other Ambulatory Visit: Payer: Self-pay | Admitting: *Deleted

## 2019-01-07 LAB — RENAL FUNCTION PANEL
Albumin: 1.6 g/dL — ABNORMAL LOW (ref 3.5–5.0)
Anion gap: 12 (ref 5–15)
BUN: 115 mg/dL — ABNORMAL HIGH (ref 8–23)
CO2: 13 mmol/L — ABNORMAL LOW (ref 22–32)
Calcium: 6.7 mg/dL — ABNORMAL LOW (ref 8.9–10.3)
Chloride: 123 mmol/L — ABNORMAL HIGH (ref 98–111)
Creatinine, Ser: 3.8 mg/dL — ABNORMAL HIGH (ref 0.61–1.24)
GFR calc Af Amer: 16 mL/min — ABNORMAL LOW (ref >60)
GFR calc non Af Amer: 14 mL/min — ABNORMAL LOW (ref >60)
Glucose, Bld: 214 mg/dL — ABNORMAL HIGH (ref 70–99)
Phosphorus: 5.5 mg/dL — ABNORMAL HIGH (ref 2.5–4.6)
Potassium: 3.8 mmol/L (ref 3.5–5.1)
Sodium: 148 mmol/L — ABNORMAL HIGH (ref 135–145)

## 2019-01-07 LAB — GLUCOSE, CAPILLARY
Glucose-Capillary: 107 mg/dL — ABNORMAL HIGH (ref 70–99)
Glucose-Capillary: 111 mg/dL — ABNORMAL HIGH (ref 70–99)
Glucose-Capillary: 141 mg/dL — ABNORMAL HIGH (ref 70–99)
Glucose-Capillary: 162 mg/dL — ABNORMAL HIGH (ref 70–99)
Glucose-Capillary: 207 mg/dL — ABNORMAL HIGH (ref 70–99)
Glucose-Capillary: 211 mg/dL — ABNORMAL HIGH (ref 70–99)

## 2019-01-07 LAB — CBC
HCT: 33.8 % — ABNORMAL LOW (ref 39.0–52.0)
Hemoglobin: 11.2 g/dL — ABNORMAL LOW (ref 13.0–17.0)
MCH: 30.9 pg (ref 26.0–34.0)
MCHC: 33.1 g/dL (ref 30.0–36.0)
MCV: 93.1 fL (ref 80.0–100.0)
Platelets: 191 10*3/uL (ref 150–400)
RBC: 3.63 MIL/uL — ABNORMAL LOW (ref 4.22–5.81)
RDW: 14.8 % (ref 11.5–15.5)
WBC: 21 10*3/uL — ABNORMAL HIGH (ref 4.0–10.5)
nRBC: 0.2 % (ref 0.0–0.2)

## 2019-01-07 LAB — MAGNESIUM: Magnesium: 2.1 mg/dL (ref 1.7–2.4)

## 2019-01-07 LAB — PROCALCITONIN: Procalcitonin: >150 ng/mL

## 2019-01-07 LAB — HEMOGLOBIN A1C
Hgb A1c MFr Bld: 5.4 % (ref 4.8–5.6)
Mean Plasma Glucose: 108.28 mg/dL

## 2019-01-07 MED ORDER — GLYCOPYRROLATE 0.2 MG/ML IJ SOLN: 0.2000 mg | INTRAMUSCULAR | Status: DC | PRN

## 2019-01-07 MED FILL — Medication: Qty: 1 | Status: AC

## 2019-01-07 NOTE — Progress Notes (Signed)
[place] Hospital Day(s): 8.   Post op day(s): 2 Days Post-Op.   Interval History: Patient seen and examined, no acute events or new complaints overnight.  Patient continue critically ill.  Sedated on mechanical ventilation.  Unable to give history.  As per his nurse, the patient still depending on vasopressors.  She has been on no sedation without any sign of neurological improvement.  Vital signs in last 24 hours: [min-max] current  Temp:  [98.1 F (36.7 C)-100.4 F (38 C)] 98.6 F (37 C) (10/27 1200) Pulse Rate:  [46-91] 81 (10/27 1200) Resp:  [0-31] 31 (10/27 1200) BP: (95-112)/(51-75) 112/60 (10/27 1200) SpO2:  [87 %-100 %] 100 % (10/27 1200) Arterial Line BP: (112-136)/(46-61) 126/58 (10/27 1200) FiO2 (%):  [30 %] 30 % (10/27 1138)     Height: 5' 5.98" (167.6 cm) Weight: 73.8 kg BMI (Calculated): 26.26    Physical Exam:  Constitutional: On mechanical ventilation ventilation, no sedation, no neurological response Respiratory: breathing non-labored at rest  Cardiovascular: Tachycardic Gastrointestinal: soft, mild distended.  Gastrostomy in place.  Wound dry and clean.  Labs:  CBC Latest Ref Rng & Units 01/07/2019 01/06/2019 01/07/2019  WBC 4.0 - 10.5 K/uL 21.0(H) 18.3(H) 8.3  Hemoglobin 13.0 - 17.0 g/dL 11.2(L) 10.8(L) 12.8(L)  Hematocrit 39.0 - 52.0 % 33.8(L) 32.0(L) 40.1  Platelets 150 - 400 K/uL 191 187 203   CMP Latest Ref Rng & Units 01/07/2019 01/06/2019 12/20/2018  Glucose 70 - 99 mg/dL 214(H) 251(H) 131(H)  BUN 8 - 23 mg/dL 115(H) 106(H) 107(H)  Creatinine 0.61 - 1.24 mg/dL 3.80(H) 3.84(H) 3.31(H)  Sodium 135 - 145 mmol/L 148(H) 147(H) 155(H)  Potassium 3.5 - 5.1 mmol/L 3.8 3.2(L) 4.0  Chloride 98 - 111 mmol/L 123(H) 122(H) 125(H)  CO2 22 - 32 mmol/L 13(L) 13(L) 16(L)  Calcium 8.9 - 10.3 mg/dL 6.7(L) 6.4(LL) 8.4(L)  Total Protein 6.5 - 8.1 g/dL - - 5.0(L)  Total Bilirubin 0.3 - 1.2 mg/dL - - 0.7  Alkaline Phos 38 - 126 U/L - - 66  AST 15 -  41 U/L - - 90(H)  ALT 0 - 44 U/L - - 6    Imaging studies: No new pertinent imaging studies   Assessment/Plan:  78 y.o. male with gastric ulcer perforation 1 Day Post-Op s/p repair of perforation and omental patch placement, complicated by pertinent comorbidities including severe Parkinson, advanced age, recent cardiorespiratory arrest, dysphagia, aspiration pneumonia, respiratory failure on mechanical ventilation, septic shock. Patient critically ill with poor prognosis.  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  From surgery standpoint I will recommend to continue NGT to suction.  Agree with current management.  Agree with palliative evaluation for helping patient to be comfortable.  We will continue to wear and help with management of this patient.  Arnold Long, MD

## 2019-01-07 NOTE — Progress Notes (Signed)
CRITICAL CARE NOTE  SYNOPSIS 78 yo WM with acute and severe acidosis from acute gastric ulcer perforation with severe septic shock and multiorgan failure s/p cardiac arrest     CC  follow up respiratory failure  SUBJECTIVE Patient remains critically ill Prognosis is guarded   BP 100/61 (BP Location: Right Arm)   Pulse (!) 50   Temp 98.1 F (36.7 C) (Axillary)   Resp (!) 25   Ht 5' 5.98" (1.676 m)   Wt 73.8 kg   SpO2 99%   BMI 26.26 kg/m    I/O last 3 completed shifts: In: 4789.5 [I.V.:4275.1; IV Piggyback:514.4] Out: 945 [Drains:945] No intake/output data recorded.  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 Large volume of pneumoperitoneum and free fluid identified within the abdomen and pelvis. Findings are compatible with bowel perforation 10/25 Findings: 1. Large quantity of bilious andturbid fluid identified(2250 mL). 2.Pre pyloric 1 cm ulcer identified and able to be closed and patched.  3. Patient did not deteriorated during surgery 10/26 critically ill, multiple vasopressors  REVIEW OF SYSTEMS  PATIENT IS UNABLE TO PROVIDE COMPLETE REVIEW OF SYSTEMS DUE TO SEVERE CRITICAL ILLNESS   PHYSICAL EXAMINATION:  GENERAL:critically ill appearing, +resp distress HEAD: Normocephalic, atraumatic.  EYES: Pupils equal, round, reactive to light.  No scleral icterus.  MOUTH: Moist mucosal membrane. NECK: Supple.  PULMONARY: +rhonchi, +wheezing CARDIOVASCULAR: S1 and S2. Regular rate and rhythm. No murmurs, rubs, or gallops.  GASTROINTESTINAL: Soft, nontender, -distended. No masses. Positive bowel sounds. No hepatosplenomegaly.   MUSCULOSKELETAL: No swelling, clubbing, or edema.  NEUROLOGIC: obtunded, GCS<8 SKIN:intact,warm,dry  MEDICATIONS: I have reviewed all medications and confirmed regimen as documented   CULTURE RESULTS   Recent Results (from the past 240 hour(s))  Blood Culture (routine x 2)     Status: None   Collection Time: 12/24/2018  4:18 PM   Specimen: BLOOD  Result Value Ref Range Status   Specimen Description BLOOD LEFT ANTECUBITAL  Final   Special Requests   Final    BOTTLES DRAWN AEROBIC AND ANAEROBIC Blood Culture adequate volume   Culture   Final    NO GROWTH 5 DAYS Performed at [facility], [address], [place], [place] [address]    Report Status 01/04/2019 FINAL  Final  Blood Culture (routine x 2)     Status: None   Collection Time: 01/07/2019  4:23 PM   Specimen: BLOOD  Result Value Ref Range Status   Specimen Description BLOOD BLOOD RIGHT FOREARM  Final   Special Requests   Final    BOTTLES DRAWN AEROBIC AND ANAEROBIC Blood Culture adequate volume   Culture   Final    NO GROWTH 5 DAYS Performed at [facility], [address] [place], [place] [address]    Report Status 01/04/2019 FINAL  Final  SARS CORONAVIRUS 2 (TAT 6-24 HRS) Nasopharyngeal Nasopharyngeal Swab     Status: None   Collection Time: 01/09/2019  4:45 PM   Specimen: Nasopharyngeal Swab  Result Value Ref Range Status   SARS Coronavirus 2 NEGATIVE NEGATIVE Final    Comment: (NOTE) SARS-CoV-2 target nucleic acids are NOT DETECTED. The SARS-CoV-2 RNA is generally detectable in upper and  lower respiratory specimens during the acute phase of infection. Negative results do not preclude SARS-CoV-2 infection, do not rule out co-infections with other pathogens, and should not be used as the sole basis for treatment or other patient management decisions. Negative results must be combined with clinical observations, patient history, and epidemiological information. The expected result is  Negative. Fact Sheet for Patients: SugarRoll.be Fact Sheet for Healthcare Providers: https://www.woods-mathews.com/ This test is not yet approved or cleared by the [place] FDA and  has been authorized for detection and/or diagnosis of SARS-CoV-2 by FDA under an Emergency Use Authorization (EUA). This EUA will remain  in effect (meaning this test can be used) for the duration of the COVID-19 declaration under Section 56 4(b)(1) of the Act, 21 U.S.C. section 360bbb-3(b)(1), unless the authorization is terminated or revoked sooner. Performed at [facility], [place] [address], [place], [place] [address]   MRSA PCR Screening     Status: None   Collection Time: 12/31/18  8:42 AM   Specimen: Nasal Mucosa; Nasopharyngeal  Result Value Ref Range Status   MRSA by PCR NEGATIVE NEGATIVE Final    Comment:        The GeneXpert MRSA Assay (FDA approved for NASAL specimens only), is one component of a comprehensive MRSA colonization surveillance program. It is not intended to diagnose MRSA infection nor to guide or monitor treatment for MRSA infections. Performed at [facility], [address] [place], [place] [address]   MRSA PCR Screening     Status: None   Collection Time: Jan 08, 2019  2:10 AM   Specimen: Nasal Mucosa; Nasopharyngeal  Result Value Ref Range Status   MRSA by PCR NEGATIVE NEGATIVE Final    Comment:        The GeneXpert MRSA Assay (FDA approved for NASAL specimens only), is one component of a comprehensive MRSA colonization surveillance program. It is not intended to diagnose MRSA infection nor to guide or monitor treatment for MRSA infections. Performed at [facility], [address] [place], [place] [address]   Culture, respiratory (non-expectorated)     Status: None (Preliminary result)   Collection Time: 08-Jan-2019  3:49 AM   Specimen: Tracheal Aspirate; Respiratory  Result Value Ref Range  Status   Specimen Description   Final    TRACHEAL ASPIRATE Performed at [facility], [address] [place], [place] [address]    Special Requests   Final    NONE Performed at [facility], [address] [place], [place] [address]    Gram Stain   Final    RARE WBC PRESENT, PREDOMINANTLY PMN MODERATE YEAST    Culture   Final    CULTURE REINCUBATED FOR BETTER GROWTH Performed at [facility], [place] [address] [place], [place] [address]    Report Status PENDING  Incomplete  CULTURE, BLOOD (ROUTINE X 2) w Reflex to ID Panel     Status: None (Preliminary result)   Collection Time: 12/16/2018  4:41 AM   Specimen: BLOOD  Result Value Ref Range Status   Specimen Description BLOOD LEFT ANTECUBITAL  Final   Special Requests   Final    BOTTLES DRAWN AEROBIC AND ANAEROBIC Blood Culture adequate volume   Culture   Final    NO GROWTH 2 DAYS Performed at [facility], [address] [place], [place] [address]    Report Status PENDING  Incomplete  CULTURE, BLOOD (ROUTINE X 2) w Reflex to ID Panel  Status: None (Preliminary result)   Collection Time: June 30, 2018  4:41 AM   Specimen: BLOOD  Result Value Ref Range Status   Specimen Description BLOOD LEFT FOREARM  Final   Special Requests   Final    BOTTLES DRAWN AEROBIC AND ANAEROBIC Blood Culture adequate volume   Culture   Final    NO GROWTH 2 DAYS Performed at [facility], [address] [place], [place] [address]    Report Status PENDING  Incomplete             Indwelling Urinary Catheter continued, requirement due to   Reason to continue Indwelling Urinary Catheter strict Intake/Output monitoring for hemodynamic instability   Central Line/ continued, requirement due to  Reason to continue [employer] Line Monitoring of central venous pressure or other hemodynamic parameters and poor IV access   Ventilator continued, requirement due to severe respiratory failure   Ventilator  Sedation RASS 0 to -2      ASSESSMENT AND PLAN SYNOPSIS  SYNOPSIS 78 yo WM with acute and severe acidosis from acute gastric ulcer perforation with severe septic shock and multiorgan failure s/p cardiac arrest   Severe ACUTE Hypoxic and Hypercapnic Respiratory Failure -continue Full MV support -continue Bronchodilator Therapy -Wean Fio2 and PEEP as tolerated   ACUTE KIDNEY INJURY/Renal Failure -follow chem 7 -follow UO -continue Foley Catheter-assess need -Avoid nephrotoxic agents -Recheck creatinine   NEUROLOGY showing signs of brain damage and anoxic brain injury Not on sedation   SHOCK-SEPSIS/HYPOVOLUMIC/CARDIOGENIC -use vasopressors to keep MAP>65 -follow ABG and LA -follow up cultures -emperic ABX - stress dose steroids -aggressive IV fluid resuscitation  CARDIAC ICU monitoring  ID -continue IV abx as prescibed -follow up cultures  GI GI PROPHYLAXIS as indicated  NUTRITIONAL STATUS DIET-->NPO Constipation protocol as indicated  ENDO - will use ICU hypoglycemic\Hyperglycemia protocol if indicated   ELECTROLYTES -follow labs as needed -replace as needed -pharmacy consultation and following   DVT/GI PRX ordered TRANSFUSIONS AS NEEDED MONITOR FSBS ASSESS the need for LABS as needed   Critical Care Time devoted to patient care services described in this note is 34 minutes.   Overall, patient is critically ill, prognosis is guarded.  Patient with Multiorgan failure and at high risk for cardiac arrest and death.  Patient is DNR  Family may be inclined to make patient comfort care  Tylar Merendino Santiago Gladavid Dareth Andrew, M.D.  [facility]  Medical Director [facility] Medical Director [facility] Card[facility]

## 2019-01-07 NOTE — Progress Notes (Signed)
Pharmacy Antibiotic Note  Alan Peterson is a 78 y.o. male admitted on 01/04/2019 with aspiration pneumonia and intra-abdominal infection.  Pharmacy has been consulted for Zosyn dosing.  Plan: -The dose of Zosyn 3.375g IV q8h will be adjusted to 3.375g IV q12h based on renal function.  -Continue Zosyn 3.375g IV q12h, day 3; SCr: 3.8, CcCl:14 -Continue Anidulafungin 100mg  IV daily, day 3  Height: 5' 5.98" (167.6 cm) Weight: 162 lb 9.6 oz (73.8 kg) IBW/kg (Calculated) : 63.76  Temp (24hrs), Avg:99.1 F (37.3 C), Min:98.1 F (36.7 C), Max:100.4 F (38 C)  Recent Labs  Lab 01/01/19 0531 January 30, 2019 0441 01/06/19 1936 01/06/19 2125 01/07/19 0415  WBC 6.6 8.3 18.3*  --  21.0*  CREATININE 2.13* 3.31*  --  3.84* 3.80*  LATICACIDVEN  --  2.4*  --   --   --     Estimated Creatinine Clearance: 14 mL/min (A) (by C-G formula based on SCr of 3.8 mg/dL (H)).    No Known Allergies  Antimicrobials this admission: Unasyn 10/20 >> 10/25 Zosyn 10/25 >>  Anidulafungin 10/25 >>  Microbiology results: 10/25 BCx: No growth x2 day, final pending  10/25 Sputum: RARE WBC PRESENT, PREDOMINANTLY PMN MODERATE YEAST,final pending  10/25 MRSA PCR: Negative 10/20 MRSA PCR: Negative 10/19 SARS-CoV-2: Negative 10/19 BCx: No growth x5 day, final   Thank you for allowing pharmacy to be a part of this patient's care.  Sallye Lat, PharmD Candidate 01/07/2019 11:43 AM

## 2019-01-07 NOTE — Progress Notes (Signed)
[place] at [facility]                                                                                                                                                                                  Patient Demographics   Alan Peterson, is a 78 y.o. male, DOB - [birth year], BVQ:[id number]  Admit date - 12/20/2018   Admitting Physician Saundra Shelling, MD  Outpatient Primary MD for the patient is [name], MD   LOS - 8  Subjective: Patient remains intubated and critically ill  Review of Systems:   CONSTITUTIONAL: Intubated Vitals:   Vitals:   01/07/19 0900 01/07/19 1000 01/07/19 1100 01/07/19 1200  BP:    112/60  Pulse: (!) 50 85 91 81  Resp: (!) 27 (!) 25 (!) 22 (!) 31  Temp:    98.6 F (37 C)  TempSrc:    Axillary  SpO2: 100% 100% 100% 100%  Weight:      Height:        Wt Readings from Last 3 Encounters:  01/04/19 73.8 kg  12/07/17 70.8 kg  06/04/17 73.5 kg     Intake/Output Summary (Last 24 hours) at 01/07/2019 1330 Last data filed at 01/07/2019 1200 Gross per 24 hour  Intake 1977.18 ml  Output 535 ml  Net 1442.18 ml    Physical Exam:   GENERAL: Critically ill  HEAD, EYES, EARS, NOSE AND THROAT: Atraumatic, normocephalic.  Pupils equal and reactive to light. Sclerae anicteric. No conjunctival injection. No oro-pharyngeal erythema.  NECK: Supple. There is no jugular venous distention. No bruits, no lymphadenopathy, no thyromegaly.  HEART: Regular rate and rhythm,. No murmurs, no rubs, no clicks.  LUNGS: Clear to auscultation bilaterally. No rales or rhonchi. No wheezes.  ABDOMEN: Postop EXTREMITIES: No evidence of any cyanosis, clubbing, or peripheral edema.  +2 pedal and radial pulses bilaterally.  NEUROLOGIC: Sedated.  SKIN: Moist and warm with no rashes appreciated.  Psych: Sedated LN: No inguinal LN enlargement    Antibiotics   Anti-infectives (From admission, onward)   Start     Dose/Rate Route Frequency  Ordered Stop   01/06/19 1000  anidulafungin (ERAXIS) 100 mg in sodium chloride 0.9 % 100 mL IVPB     100 mg 78 mL/hr over 100 Minutes Intravenous Every 24 hours 12/29/2018 1146     12/26/2018 1300  anidulafungin (ERAXIS) 200 mg in sodium chloride 0.9 % 200 mL IVPB     200 mg 78 mL/hr over 200 Minutes Intravenous  Once 01/03/2019 1146 12/14/2018 1742   12/12/2018 1230  anidulafungin (ERAXIS) 100 mg in sodium chloride 0.9 % 100 mL IVPB  Status:  Discontinued  100 mg 78 mL/hr over 100 Minutes Intravenous Every 24 hours 12/28/2018 1132 12/22/2018 1146   01/09/2019 1145  piperacillin-tazobactam (ZOSYN) IVPB 3.375 g  Status:  Discontinued     3.375 g 12.5 mL/hr over 240 Minutes Intravenous Every 8 hours 12/27/2018 1132 12/23/2018 1136   12/17/2018 1145  piperacillin-tazobactam (ZOSYN) IVPB 3.375 g     3.375 g 12.5 mL/hr over 240 Minutes Intravenous Every 12 hours 01/03/2019 1136     12/31/18 1600  ceFEPIme (MAXIPIME) 2 g in sodium chloride 0.9 % 100 mL IVPB  Status:  Discontinued     2 g 200 mL/hr over 30 Minutes Intravenous Every 24 hours 12/29/2018 1858 12/31/18 1158   12/31/18 1207  Ampicillin-Sulbactam (UNASYN) 3 g in sodium chloride 0.9 % 100 mL IVPB  Status:  Discontinued     3 g 200 mL/hr over 30 Minutes Intravenous Every 12 hours 12/31/18 1207 01/02/2019 0616   12/31/18 1200  Ampicillin-Sulbactam (UNASYN) 3 g in sodium chloride 0.9 % 100 mL IVPB  Status:  Discontinued     3 g 200 mL/hr over 30 Minutes Intravenous Every 6 hours 12/31/18 1158 12/31/18 1207   12/22/2018 1849  vancomycin variable dose per unstable renal function (pharmacist dosing)  Status:  Discontinued      Does not apply See admin instructions 01/04/2019 1858 12/31/18 1158   12/21/2018 1800  vancomycin (VANCOCIN) 500 mg in sodium chloride 0.9 % 100 mL IVPB     500 mg 100 mL/hr over 60 Minutes Intravenous  Once 01/01/2019 1628 12/18/2018 1941   12/18/2018 1630  ceFEPIme (MAXIPIME) 2 g in sodium chloride 0.9 % 100 mL IVPB     2 g 200 mL/hr over 30 Minutes  Intravenous  Once 01/09/2019 1617 12/23/2018 1701   12/12/2018 1630  vancomycin (VANCOCIN) IVPB 1000 mg/200 mL premix     1,000 mg 200 mL/hr over 60 Minutes Intravenous  Once 12/16/2018 1617 01/02/2019 1838      Medications   Scheduled Meds: . chlorhexidine gluconate (MEDLINE KIT)  15 mL Mouth Rinse BID  . Chlorhexidine Gluconate Cloth  6 each Topical Daily  . heparin  5,000 Units Subcutaneous Q8H  . hydrocerin   Topical BID  . hydrocortisone sod succinate (SOLU-CORTEF) inj  50 mg Intravenous Q6H  . insulin aspart  0-9 Units Subcutaneous Q4H  . mouth rinse  15 mL Mouth Rinse 10 times per day  . polyvinyl alcohol  1 drop Both Eyes Q4H  . rotigotine  1 patch Transdermal Daily   Continuous Infusions: . sodium chloride Stopped (12/18/2018 0136)  . anidulafungin Stopped (01/06/19 2007)  . dextrose Stopped (01/06/19 1040)  . DOPamine 5 mcg/kg/min (01/07/19 0027)  . epinephrine Stopped (12/13/2018 1303)  . famotidine (PEPCID) IV Stopped (01/07/19 0439)  . fentaNYL infusion INTRAVENOUS    . norepinephrine (LEVOPHED) Adult infusion 10 mcg/min (12/27/2018 1927)  . phenylephrine (NEO-SYNEPHRINE) Adult infusion 150 mcg/min (01/07/19 1223)  . piperacillin-tazobactam (ZOSYN)  IV 12.5 mL/hr at 01/07/19 1200  . vasopressin (PITRESSIN) infusion - *FOR SHOCK* 0.03 Units/min (12/18/2018 1600)   PRN Meds:.sodium chloride, fentaNYL, glycopyrrolate, midazolam, [DISCONTINUED] ondansetron **OR** ondansetron (ZOFRAN) IV   Data Review:   Micro Results Recent Results (from the past 240 hour(s))  Blood Culture (routine x 2)     Status: None   Collection Time: 12/22/2018  4:18 PM   Specimen: BLOOD  Result Value Ref Range Status   Specimen Description BLOOD LEFT ANTECUBITAL  Final   Special Requests   Final  BOTTLES DRAWN AEROBIC AND ANAEROBIC Blood Culture adequate volume   Culture   Final    NO GROWTH 5 DAYS Performed at [facility], [address] [place], [place] [address]    Report Status  01/04/2019 FINAL  Final  Blood Culture (routine x 2)     Status: None   Collection Time: 01/09/2019  4:23 PM   Specimen: BLOOD  Result Value Ref Range Status   Specimen Description BLOOD BLOOD RIGHT FOREARM  Final   Special Requests   Final    BOTTLES DRAWN AEROBIC AND ANAEROBIC Blood Culture adequate volume   Culture   Final    NO GROWTH 5 DAYS Performed at [facility], [address], [place], [place] [address]    Report Status 01/04/2019 FINAL  Final  SARS CORONAVIRUS 2 (TAT 6-24 HRS) Nasopharyngeal Nasopharyngeal Swab     Status: None   Collection Time: 12/25/2018  4:45 PM   Specimen: Nasopharyngeal Swab  Result Value Ref Range Status   SARS Coronavirus 2 NEGATIVE NEGATIVE Final    Comment: (NOTE) SARS-CoV-2 target nucleic acids are NOT DETECTED. The SARS-CoV-2 RNA is generally detectable in upper and lower respiratory specimens during the acute phase of infection. Negative results do not preclude SARS-CoV-2 infection, do not rule out co-infections with other pathogens, and should not be used as the sole basis for treatment or other patient management decisions. Negative results must be combined with clinical observations, patient history, and epidemiological information. The expected result is Negative. Fact Sheet for Patients: SugarRoll.be Fact Sheet for Healthcare Providers: https://www.woods-mathews.com/ This test is not yet approved or cleared by the [place] FDA and  has been authorized for detection and/or diagnosis of SARS-CoV-2 by FDA under an Emergency Use Authorization (EUA). This EUA will remain  in effect (meaning this test can be used) for the duration of the COVID-19 declaration under Section 56 4(b)(1) of the Act, 21 U.S.C. section 360bbb-3(b)(1), unless the authorization is terminated or revoked sooner. Performed at [facility], [place] [address], [place], [place] [address]   MRSA PCR Screening      Status: None   Collection Time: 12/31/18  8:42 AM   Specimen: Nasal Mucosa; Nasopharyngeal  Result Value Ref Range Status   MRSA by PCR NEGATIVE NEGATIVE Final    Comment:        The GeneXpert MRSA Assay (FDA approved for NASAL specimens only), is one component of a comprehensive MRSA colonization surveillance program. It is not intended to diagnose MRSA infection nor to guide or monitor treatment for MRSA infections. Performed at [facility], [address] [place], [place] [address]   MRSA PCR Screening     Status: None   Collection Time: 12/19/2018  2:10 AM   Specimen: Nasal Mucosa; Nasopharyngeal  Result Value Ref Range Status   MRSA by PCR NEGATIVE NEGATIVE Final    Comment:        The GeneXpert MRSA Assay (FDA approved for NASAL specimens only), is one component of a comprehensive MRSA colonization surveillance program. It is not intended to diagnose MRSA infection nor to guide or monitor treatment for MRSA infections. Performed at [facility], [address] [place], [place] [address]   Culture, respiratory (non-expectorated)     Status: None (Preliminary result)   Collection Time: 12/24/2018  3:49 AM   Specimen: Tracheal Aspirate; Respiratory  Result Value Ref Range Status   Specimen Description   Final    TRACHEAL ASPIRATE Performed at  [facility], [address], [place], [place] [address]    Special Requests   Final    NONE Performed at [facility], [place], [place] [address]    Gram Stain   Final    RARE WBC PRESENT, PREDOMINANTLY PMN MODERATE YEAST    Culture   Final    CULTURE REINCUBATED FOR BETTER GROWTH Performed at [facility], [place] [address] [place], [place] [address]    Report Status PENDING  Incomplete  CULTURE, BLOOD (ROUTINE X 2) w Reflex to ID Panel     Status: None (Preliminary result)   Collection Time: 01/10/2019  4:41 AM   Specimen: BLOOD  Result Value Ref Range Status    Specimen Description BLOOD LEFT ANTECUBITAL  Final   Special Requests   Final    BOTTLES DRAWN AEROBIC AND ANAEROBIC Blood Culture adequate volume   Culture   Final    NO GROWTH 2 DAYS Performed at [facility], [address] [place], [place] [address]    Report Status PENDING  Incomplete  CULTURE, BLOOD (ROUTINE X 2) w Reflex to ID Panel     Status: None (Preliminary result)   Collection Time: 12/21/2018  4:41 AM   Specimen: BLOOD  Result Value Ref Range Status   Specimen Description BLOOD LEFT FOREARM  Final   Special Requests   Final    BOTTLES DRAWN AEROBIC AND ANAEROBIC Blood Culture adequate volume   Culture   Final    NO GROWTH 2 DAYS Performed at [facility], [address] [place],  [address]    Report Status PENDING  Incomplete    Radiology Reports Ct Abdomen Pelvis Wo Contrast  Result Date: 12/21/2018 CLINICAL DATA:  Abdominal distention. EXAM: CT ABDOMEN AND PELVIS WITHOUT CONTRAST TECHNIQUE: Multidetector CT imaging of the abdomen and pelvis was performed following the standard protocol without IV contrast. COMPARISON:  Earlier today FINDINGS: Lower chest: Bilateral pleural effusions identified left greater than right. There is also subsegmental atelectasis overlying bilateral effusions. Three vessel coronary artery atherosclerotic calcifications. Aortic atherosclerosis. Hepatobiliary: No focal liver abnormality is seen. No gallstones, gallbladder wall thickening, or biliary dilatation. Pancreas: Unremarkable. No pancreatic ductal dilatation or surrounding inflammatory changes. Spleen: Normal in size without focal abnormality. Adrenals/Urinary Tract: Normal appearance of the adrenal glands. Bilateral nephrolithiasis identified. There is left renal atrophy. New from previous exam. Left-sided hydronephrosis and hydroureter is identified. Stone within the left ureter at the approximate L5-S1 level measures 8 mm, image 55/2. There are 2 small stones  within the dependent portion of the bladder measuring up to 4 mm. Stomach/Bowel: The nasogastric tube tip extends beyond the margin of the wall of the gastric fundus and resides in the peritoneal cavity. Extensive pneumoperitoneum is identified. Additionally, free fluid with dilute extravasated enteric contrast material is identified within the peritoneal cavity of the abdomen and pelvis. There is increase caliber of the proximal small bowel loops which measure up to 4.2 cm. Multiple small bowel fluid levels are identified. The appendix is visualized and appears normal. Normal appearance of the colon. Vascular/Lymphatic: Aortic atherosclerosis. No aneurysm. No abdominopelvic adenopathy. Reproductive: Prostate gland is enlarged containing calcifications and a focal area of low-density in the right posterior gland. Other: Large volume pneumoperitoneum, free fluid, and extravasated enteric contrast material. Musculoskeletal: No acute or significant osseous findings. IMPRESSION: 1. The nasogastric tube tip extends beyond the margin of the wall of the gastric fundus and resides in the peritoneal cavity. Large  volume of pneumoperitoneum and free fluid identified within the abdomen and pelvis. Findings are compatible with bowel perforation. 2. Left-sided hydronephrosis and hydroureter secondary to 8 mm left ureteral calculus. 3. Three vessel coronary artery calcifications noted. 4. Bilateral pleural effusions and overlying subsegmental atelectasis. 5. Enlarged prostate gland with focal area of low-density within the right posterior gland. Correlate for any clinical signs or symptoms of prostatitis. 6. Left renal atrophy. 7. Bilateral nephrolithiasis. Aortic Atherosclerosis (ICD10-I70.0). Electronically Signed   By: Kerby Moors M.D.   On: 12/29/2018 06:47   Dg Abd 1 View  Result Date: 12/16/2018 CLINICAL DATA:  78 year old male status post enteric tube placement. EXAM: PORTABLE CHEST 1 VIEW COMPARISON:  Chest  radiograph dated 12/13/2018 FINDINGS: Endotracheal tube approximately 4 cm above the carina. An enteric tube extends into the left upper abdomen with tip likely in the stomach. Probable small left pleural effusion and associated atelectatic changes of the left lung base. No focal consolidation or pneumothorax. Mild cardiomegaly. There is apparent outlining of the bowel wall with air (Rigler's sign) concerning for pneumoperitoneum. Further evaluation with CT is recommended. There is degenerative changes of the spine. IMPRESSION: 1. Endotracheal tube above the carina. 2. Enteric tube with tip in the stomach. 3. Findings concerning for pneumoperitoneum. Further evaluation with CT is recommended. 4. Probable small left pleural effusion and left lung base atelectasis. These results were called by telephone at the time of interpretation on 12/28/2018 at 3:47 am to provider [facility] , who verbally acknowledged these results. Electronically Signed   By: Anner Crete M.D.   On: 01/06/2019 03:50   Ct Head Wo Contrast  Result Date: 01/02/2019 CLINICAL DATA:  Altered level of consciousness and weakness EXAM: CT HEAD WITHOUT CONTRAST TECHNIQUE: Contiguous axial images were obtained from the base of the skull through the vertex without intravenous contrast. COMPARISON:  08/24/2017 FINDINGS: Brain: Considerable motion artifact is identified with severe limitations on the exam. There is persistent increased density along the anterior cranial fossa particularly on the left similar to that seen on the prior exam. Considerable beam hardening artifact is identified limiting the examination although the persistent density is suspicious for underlying meningioma. If the patient can undergo MRI examination that would be helpful. Diffuse atrophic changes are seen. Chronic white matter ischemic changes are noted. No findings to suggest acute hemorrhage or acute infarction are seen. Vascular: No hyperdense vessel or unexpected  calcification. Skull: Normal. Negative for fracture or focal lesion. Sinuses/Orbits: No acute finding. Other: Stable fluid within the mastoid air cells on the left. IMPRESSION: Chronic atrophic and ischemic changes stable from previous exam. Persistent increased density is noted along the base of the anterior cranial fossa particularly on the left. Although this is likely related to underlying beam hardening artifact the possibility of a meningioma could not be totally excluded. MRI would be helpful in this regard. Electronically Signed   By: Inez Catalina M.D.   On: 01/02/2019 10:54   Dg Chest Port 1 View  Result Date: 12/31/2018 CLINICAL DATA:  Unsuccessful attempt at left IJ catheter placement. Rule out PTX EXAM: PORTABLE CHEST 1 VIEW COMPARISON:  01/06/2019 FINDINGS: ET tube tip is above the carina. There is a enteric tube with tip below the field of view. No pneumothorax following attempted IJ catheter placement. Normal heart size. Decreased lung volumes. Small bilateral pleural effusions noted with overlying compressive type atelectasis. IMPRESSION: 1. No pneumothorax after attempted IJ catheter placement. 2. Small bilateral pleural effusions with overlying compressive type atelectasis. 3.  The enteric tube tip is above the carina. Electronically Signed   By: Kerby Moors M.D.   On: 12/20/2018 08:34   Dg Chest Port 1 View  Result Date: 12/22/2018 CLINICAL DATA:  78 year old male status post enteric tube placement. EXAM: PORTABLE CHEST 1 VIEW COMPARISON:  Chest radiograph dated 12/27/2018 FINDINGS: Endotracheal tube approximately 4 cm above the carina. An enteric tube extends into the left upper abdomen with tip likely in the stomach. Probable small left pleural effusion and associated atelectatic changes of the left lung base. No focal consolidation or pneumothorax. Mild cardiomegaly. There is apparent outlining of the bowel wall with air (Rigler's sign) concerning for pneumoperitoneum. Further  evaluation with CT is recommended. There is degenerative changes of the spine. IMPRESSION: 1. Endotracheal tube above the carina. 2. Enteric tube with tip in the stomach. 3. Findings concerning for pneumoperitoneum. Further evaluation with CT is recommended. 4. Probable small left pleural effusion and left lung base atelectasis. These results were called by telephone at the time of interpretation on 12/24/2018 at 3:47 am to provider [facility] , who verbally acknowledged these results. Electronically Signed   By: Anner Crete M.D.   On: 12/20/2018 03:50   Dg Chest Portable 1 View  Result Date: 12/26/2018 CLINICAL DATA:  Chest pain, shortness of breath. EXAM: PORTABLE CHEST 1 VIEW COMPARISON:  None. FINDINGS: The heart size and mediastinal contours are within normal limits. Right lung is clear. No pneumothorax is noted. Mild left basilar atelectasis is noted with probable small left pleural effusion. The visualized skeletal structures are unremarkable. IMPRESSION: Mild left basilar subsegmental atelectasis with probable small left pleural effusion. Electronically Signed   By: Marijo Conception M.D.   On: 01/07/2019 15:52     CBC Recent Labs  Lab 01/01/19 0531 12/14/2018 0441 01/06/19 1936 01/07/19 0415  WBC 6.6 8.3 18.3* 21.0*  HGB 13.7 12.8* 10.8* 11.2*  HCT 40.5 40.1 32.0* 33.8*  PLT 153 203 187 191  MCV 92.9 96.4 92.8 93.1  MCH 31.4 30.8 31.3 30.9  MCHC 33.8 31.9 33.8 33.1  RDW 13.2 14.6 14.8 14.8  LYMPHSABS  --  0.4* 1.5  --   MONOABS  --  0.2 0.0*  --   EOSABS  --  0.0 0.0  --   BASOSABS  --  0.1 0.0  --     Chemistries  Recent Labs  Lab 01/01/19 0531 12/29/2018 0441 01/06/19 2125 01/07/19 0415  NA 145 155* 147* 148*  K 4.2 4.0 3.2* 3.8  CL 116* 125* 122* 123*  CO2 20* 16* 13* 13*  GLUCOSE 118* 131* 251* 214*  BUN 74* 107* 106* 115*  CREATININE 2.13* 3.31* 3.84* 3.80*  CALCIUM 8.0* 8.4* 6.4* 6.7*  MG  --  2.7*  --  2.1  AST  --  90*  --   --   ALT  --  6  --   --    ALKPHOS  --  66  --   --   BILITOT  --  0.7  --   --    ------------------------------------------------------------------------------------------------------------------ estimated creatinine clearance is 14 mL/min (A) (by C-G formula based on SCr of 3.8 mg/dL (H)). ------------------------------------------------------------------------------------------------------------------ Recent Labs    01/06/19 1936  HGBA1C 5.4   ------------------------------------------------------------------------------------------------------------------ No results for input(s): CHOL, HDL, LDLCALC, TRIG, CHOLHDL, LDLDIRECT in the last 72 hours. ------------------------------------------------------------------------------------------------------------------ No results for input(s): TSH, T4TOTAL, T3FREE, THYROIDAB in the last 72 hours.  Invalid input(s): FREET3 ------------------------------------------------------------------------------------------------------------------ No results for input(s): VITAMINB12, FOLATE, FERRITIN,  TIBC, IRON, RETICCTPCT in the last 72 hours.  Coagulation profile No results for input(s): INR, PROTIME in the last 168 hours.  No results for input(s): DDIMER in the last 72 hours.  Cardiac Enzymes No results for input(s): CKMB, TROPONINI, MYOGLOBIN in the last 168 hours.  Invalid input(s): CK ------------------------------------------------------------------------------------------------------------------ Invalid input(s): POCBNP    Assessment & Plan    78 yr old male patient with a known history of parkinsons disease, BPH , hyperlipidemia presents to ER with generalized weakness and difficulty breathing. Patient also complaints of chest discomfort on deep breathing..   -Acute respiratory failure with needing cardiac resuscitation Continue ventilator Extubate when able  -Sepsis Continue broad-spectrum antibiotics with Zosyn   -Hypotension  continue  pressors   -Status post surgery status post gastric ulcer repair with open gGastrostomy tube    -Parkinson's with worsening has been seen by neurology Continue supportive care   -Dysphagia patient has severe dysphagia has pocketing his food.    Prognosis very poor in this 78 year old with worsening Parkinson's now on the ventilator multiple pressors status post surgery         Code Status Orders  (From admission, onward)         Start     Ordered   12/31/2018 2021  Full code  Continuous     12/26/2018 2020        Code Status History    This patient has a current code status but no historical code status.   Advance Care Planning Activity    Advance Directive Documentation     Most Recent Value  Type of Advance Directive  Living will  Pre-existing out of facility DNR order (yellow form or pink MOST form)  -  "MOST" Form in Place?  -           Consults none  DVT Prophylaxis SCDs Lab Results  Component Value Date   PLT 191 01/07/2019     Time Spent in minutes 35 minutes  Greater than 50% of time spent    Dustin Flock M.D on 01/07/2019 at 1:30 PM  Between 7am to 6pm - Pager - [phone]  After 6pm go to [email or web address] - Proofreader  [employer]   Office  [phone]

## 2019-01-07 NOTE — Patient Outreach (Signed)
[facility]) Care Management  01/07/2019  Alan Peterson 1941-02-06 [id number]    Telephone Assessment -2nd outreach  RN attempted outreach call however unsuccessful. RN able to leave a HIPAA approved voice message requested a call back.    Plan: Will attempt another call back within the next week.  Raina Mina, RN Care Management Coordinator [place] Office [phone]

## 2019-01-07 NOTE — Progress Notes (Signed)
Palliative:  HPI:78 y.o.malewith past medical history of Parkinson's disease, BPH, HLDadmitted on 10/19/2020with SOB, chest pain, weakness withlikelyaspiration pneumonia.He continues to decline with worsening swallow function during hospitalization.He had decline with perforated gastric ulcer s/p surgical intervention 01/04/2019 along with cardiac arrest. Currently intubated in ICU with multiorgan failure and no expectation for meaningful recovery.    I spent much time today speaking with [name] (son) and representative from [facility] at his request. I have also spoken with daughter at bedside. [name] is desparately hoping that he will be able to arrange for anatomical gift for his father as he wants to feel that some good comes from his father's life at the end. [name] was given multiple options to reach out to for donation and he is (frantically) trying to make arrangements before his father's death. I did reiterate to him today that although I do not see any signs of pain or discomfort it is difficult to know what Mr. Banka is feeling and thinking. I encouraged him that we need to consider extubation and comfort for his father and I do not recommend to let him linger in this condition much longer. [name] understands and wants to make some more calls today to make sure there are no options for anatomical donation and then we will proceed with comfort care and extubation. I told [name] to keep me updated and let me know when we may proceed with this next step.   Also spoke with daughter at bedside. All questions/concerns addressed. Emotional support provided.   Exam: Unresponsive. On vent. Some paroxysmal abd breathing at times. Abd incision dressing intact. JP x 2 in abd. Extremities warm to touch.   Plan: - Awaiting okay from son [name] to proceed with comfort measures. He wants to exhaust options for anatomical donation prior to moving towards extubation.  - With impending comfort care I would not recommend  escalation of care.   Hiko, NP Palliative Medicine Team Pager [phone] (Please see [email or web address] for schedule) Team Phone [phone]    Greater than 50%  of this time was spent counseling and coordinating care related to the above assessment and plan

## 2019-01-08 DIAGNOSIS — K275 Chronic or unspecified peptic ulcer, site unspecified, with perforation: Secondary | ICD-10-CM

## 2019-01-08 LAB — CBC
HCT: 28.6 % — ABNORMAL LOW (ref 39.0–52.0)
Hemoglobin: 9.7 g/dL — ABNORMAL LOW (ref 13.0–17.0)
MCH: 30.9 pg (ref 26.0–34.0)
MCHC: 33.9 g/dL (ref 30.0–36.0)
MCV: 91.1 fL (ref 80.0–100.0)
Platelets: 124 10*3/uL — ABNORMAL LOW (ref 150–400)
RBC: 3.14 MIL/uL — ABNORMAL LOW (ref 4.22–5.81)
RDW: 14.6 % (ref 11.5–15.5)
WBC: 12 10*3/uL — ABNORMAL HIGH (ref 4.0–10.5)
nRBC: 0.3 % — ABNORMAL HIGH (ref 0.0–0.2)

## 2019-01-08 LAB — BASIC METABOLIC PANEL
Anion gap: 12 (ref 5–15)
BUN: 125 mg/dL — ABNORMAL HIGH (ref 8–23)
CO2: 13 mmol/L — ABNORMAL LOW (ref 22–32)
Calcium: 6.3 mg/dL — CL (ref 8.9–10.3)
Chloride: 124 mmol/L — ABNORMAL HIGH (ref 98–111)
Creatinine, Ser: 3.96 mg/dL — ABNORMAL HIGH (ref 0.61–1.24)
GFR calc Af Amer: 16 mL/min — ABNORMAL LOW (ref >60)
GFR calc non Af Amer: 13 mL/min — ABNORMAL LOW (ref >60)
Glucose, Bld: 153 mg/dL — ABNORMAL HIGH (ref 70–99)
Potassium: 3.1 mmol/L — ABNORMAL LOW (ref 3.5–5.1)
Sodium: 149 mmol/L — ABNORMAL HIGH (ref 135–145)

## 2019-01-08 LAB — GLUCOSE, CAPILLARY
Glucose-Capillary: 121 mg/dL — ABNORMAL HIGH (ref 70–99)
Glucose-Capillary: 130 mg/dL — ABNORMAL HIGH (ref 70–99)
Glucose-Capillary: 82 mg/dL (ref 70–99)

## 2019-01-08 LAB — PROCALCITONIN: Procalcitonin: 144.4 ng/mL

## 2019-01-08 LAB — CULTURE, RESPIRATORY W GRAM STAIN

## 2019-01-08 MED ORDER — ONDANSETRON 4 MG PO TBDP
4.0000 mg | ORAL_TABLET | Freq: Four times a day (QID) | ORAL | Status: DC | PRN
  Filled 2019-01-08: qty 1

## 2019-01-08 MED ORDER — HYDROMORPHONE HCL 1 MG/ML IJ SOLN
1.0000 mg | INTRAMUSCULAR | Status: DC | PRN
  Administered 2019-01-08: 1 mg via INTRAVENOUS
  Filled 2019-01-08: qty 1

## 2019-01-08 MED ORDER — LORAZEPAM 2 MG/ML IJ SOLN: 1.0000 mg | INTRAMUSCULAR | Status: DC | PRN

## 2019-01-08 MED ORDER — HALOPERIDOL LACTATE 2 MG/ML PO CONC
0.5000 mg | ORAL | Status: DC | PRN
  Filled 2019-01-08: qty 0.3

## 2019-01-08 MED ORDER — ACETAMINOPHEN 650 MG RE SUPP: 650.0000 mg | Freq: Four times a day (QID) | RECTAL | Status: DC | PRN

## 2019-01-08 MED ORDER — POLYVINYL ALCOHOL 1.4 % OP SOLN
1.0000 [drp] | Freq: Four times a day (QID) | OPHTHALMIC | Status: DC | PRN
  Filled 2019-01-08: qty 15

## 2019-01-08 MED ORDER — LORAZEPAM 2 MG/ML IJ SOLN
1.0000 mg | INTRAMUSCULAR | Status: DC | PRN
  Administered 2019-01-08: 15:00:00 1 mg via INTRAVENOUS
  Filled 2019-01-08: qty 1

## 2019-01-08 MED ORDER — HALOPERIDOL 0.5 MG PO TABS
0.5000 mg | ORAL_TABLET | ORAL | Status: DC | PRN
  Filled 2019-01-08: qty 1

## 2019-01-08 MED ORDER — HALOPERIDOL LACTATE 5 MG/ML IJ SOLN: 0.5000 mg | INTRAMUSCULAR | Status: DC | PRN

## 2019-01-08 MED ORDER — GLYCOPYRROLATE 0.2 MG/ML IJ SOLN
0.4000 mg | INTRAMUSCULAR | Status: DC
  Administered 2019-01-08 (×2): 0.4 mg via INTRAVENOUS
  Filled 2019-01-08 (×2): qty 2

## 2019-01-08 MED ORDER — ONDANSETRON HCL 4 MG/2ML IJ SOLN: 4.0000 mg | Freq: Four times a day (QID) | INTRAMUSCULAR | Status: DC | PRN

## 2019-01-08 MED ORDER — BIOTENE DRY MOUTH MT LIQD: 15.0000 mL | OROMUCOSAL | Status: DC | PRN

## 2019-01-08 MED ORDER — POTASSIUM CL IN DEXTROSE 5% 20 MEQ/L IV SOLN
20.0000 meq | INTRAVENOUS | Status: DC
  Filled 2019-01-08: qty 1000

## 2019-01-08 MED ORDER — HYDROMORPHONE HCL 1 MG/ML IJ SOLN
1.0000 mg | INTRAMUSCULAR | Status: DC | PRN
  Administered 2019-01-08: 2 mg via INTRAVENOUS
  Filled 2019-01-08 (×2): qty 2

## 2019-01-08 MED ORDER — ACETAMINOPHEN 325 MG PO TABS: 650.0000 mg | ORAL_TABLET | Freq: Four times a day (QID) | ORAL | Status: DC | PRN

## 2019-01-09 ENCOUNTER — Other Ambulatory Visit: Payer: Self-pay | Admitting: *Deleted

## 2019-01-09 NOTE — Patient Outreach (Signed)
[facility]) Care Management  01/09/2019  Alan Peterson [birth year] [id number]    Pt expired 10/28 with comfort measures only after extubation. Case closed and inactive.  Raina Mina, RN Care Management Coordinator [place] Office [phone]

## 2019-01-09 NOTE — Progress Notes (Signed)
[employer] - [place] at [facility]                                                                                                                                                                                  Patient Demographics   Alan Peterson, is a 78 y.o. male, DOB - 12/11/1940, ZOX:[id number]  Admit date - 2019/01/06   Admitting Physician Ihor Austin, MD  Outpatient Primary MD for the patient is Karie Schwalbe, MD   LOS - 9  Patient seen and examined 10/28 AM  Subjective: Patient remains intubated and critically ill. Multiple pressor support  Review of Systems:   CONSTITUTIONAL: Intubated Vitals:   Vitals:   01/02/2019 0332 01/09/2019 0400 12/18/2018 0735 01/11/2019 0800  BP:  (!) 105/55  (!) 96/57  Pulse:  (!) 48  (!) 44  Resp:  (!) 22  (!) 21  Temp:  98.5 F (36.9 C)  98 F (36.7 C)  TempSrc:  Axillary  Axillary  SpO2: 100% 99% 100% 100%  Weight:      Height:        Wt Readings from Last 3 Encounters:  01/04/19 73.8 kg  12/07/17 70.8 kg  06/04/17 73.5 kg     Intake/Output Summary (Last 24 hours) at 01/09/2019 0703 Last data filed at 12/21/2018 1800 Gross per 24 hour  Intake 493.73 ml  Output 290 ml  Net 203.73 ml    Physical Exam:   GENERAL: Critically ill  HEAD, EYES, EARS, NOSE AND THROAT: Atraumatic, normocephalic.  Pupils equal and reactive to light. Sclerae anicteric. No conjunctival injection. No oro-pharyngeal erythema.  NECK: Supple. There is no jugular venous distention. No bruits, no lymphadenopathy, no thyromegaly.  HEART: Regular rate and rhythm,. No murmurs, no rubs, no clicks.  LUNGS: Clear to auscultation bilaterally. No rales or rhonchi. No wheezes.  ABDOMEN: Postop EXTREMITIES: No evidence of any cyanosis, clubbing, or peripheral edema.  +2 pedal and radial pulses bilaterally.  NEUROLOGIC: Sedated.  SKIN: Moist and warm with no rashes appreciated.  Psych: Sedated LN: No inguinal LN enlargement    Antibiotics    Anti-infectives (From admission, onward)   Start     Dose/Rate Route Frequency Ordered Stop   01/06/19 1000  anidulafungin (ERAXIS) 100 mg in sodium chloride 0.9 % 100 mL IVPB  Status:  Discontinued     100 mg 78 mL/hr over 100 Minutes Intravenous Every 24 hours 12/18/2018 1146 12/20/2018 1438   12/25/2018 1300  anidulafungin (ERAXIS) 200 mg in sodium chloride 0.9 % 200 mL IVPB     200 mg 78 mL/hr over 200 Minutes Intravenous  Once 12/29/2018 1146 01/04/2019 1742   12/26/2018 1230  anidulafungin (ERAXIS) 100 mg in sodium chloride 0.9 % 100 mL IVPB  Status:  Discontinued     100 mg 78 mL/hr over 100 Minutes Intravenous Every 24 hours 01/07/2019 1132 12/22/2018 1146   12/28/2018 1145  piperacillin-tazobactam (ZOSYN) IVPB 3.375 g  Status:  Discontinued     3.375 g 12.5 mL/hr over 240 Minutes Intravenous Every 8 hours 12/30/2018 1132 12/21/2018 1136   12/28/2018 1145  piperacillin-tazobactam (ZOSYN) IVPB 3.375 g  Status:  Discontinued     3.375 g 12.5 mL/hr over 240 Minutes Intravenous Every 12 hours 01/04/2019 1136 12/19/2018 1438   12/31/18 1600  ceFEPIme (MAXIPIME) 2 g in sodium chloride 0.9 % 100 mL IVPB  Status:  Discontinued     2 g 200 mL/hr over 30 Minutes Intravenous Every 24 hours 2019-01-10 1858 12/31/18 1158   12/31/18 1207  Ampicillin-Sulbactam (UNASYN) 3 g in sodium chloride 0.9 % 100 mL IVPB  Status:  Discontinued     3 g 200 mL/hr over 30 Minutes Intravenous Every 12 hours 12/31/18 1207 01/06/2019 0616   12/31/18 1200  Ampicillin-Sulbactam (UNASYN) 3 g in sodium chloride 0.9 % 100 mL IVPB  Status:  Discontinued     3 g 200 mL/hr over 30 Minutes Intravenous Every 6 hours 12/31/18 1158 12/31/18 1207   01-10-2019 1849  vancomycin variable dose per unstable renal function (pharmacist dosing)  Status:  Discontinued      Does not apply See admin instructions 01-10-2019 1858 12/31/18 1158   2019-01-10 1800  vancomycin (VANCOCIN) 500 mg in sodium chloride 0.9 % 100 mL IVPB     500 mg 100 mL/hr over 60 Minutes  Intravenous  Once 2019/01/10 1628 01-10-19 1941   01-10-19 1630  ceFEPIme (MAXIPIME) 2 g in sodium chloride 0.9 % 100 mL IVPB     2 g 200 mL/hr over 30 Minutes Intravenous  Once 01-10-2019 1617 Jan 10, 2019 1701   2019-01-10 1630  vancomycin (VANCOCIN) IVPB 1000 mg/200 mL premix     1,000 mg 200 mL/hr over 60 Minutes Intravenous  Once 01/10/19 1617 01/10/2019 1838      Medications      Data Review:   Micro Results Recent Results (from the past 240 hour(s))  Blood Culture (routine x 2)     Status: None   Collection Time: 2019-01-10  4:18 PM   Specimen: BLOOD  Result Value Ref Range Status   Specimen Description BLOOD LEFT ANTECUBITAL  Final   Special Requests   Final    BOTTLES DRAWN AEROBIC AND ANAEROBIC Blood Culture adequate volume   Culture   Final    NO GROWTH 5 DAYS Performed at [facility], [address] [place], [place] [address]    Report Status 01/04/2019 FINAL  Final  Blood Culture (routine x 2)     Status: None   Collection Time: 01-10-19  4:23 PM   Specimen: BLOOD  Result Value Ref Range Status   Specimen Description BLOOD BLOOD RIGHT FOREARM  Final   Special Requests   Final    BOTTLES DRAWN AEROBIC AND ANAEROBIC Blood Culture adequate volume   Culture   Final    NO GROWTH 5 DAYS Performed at [facility], [address] [place], [place] [address]    Report Status 01/04/2019 FINAL  Final  SARS CORONAVIRUS 2 (TAT 6-24 HRS) Nasopharyngeal Nasopharyngeal Swab     Status: None   Collection Time: January 10, 2019  4:45 PM   Specimen: Nasopharyngeal Swab  Result Value Ref Range Status  SARS Coronavirus 2 NEGATIVE NEGATIVE Final    Comment: (NOTE) SARS-CoV-2 target nucleic acids are NOT DETECTED. The SARS-CoV-2 RNA is generally detectable in upper and lower respiratory specimens during the acute phase of infection. Negative results do not preclude SARS-CoV-2 infection, do not rule out co-infections with other pathogens, and should not be used as the sole  basis for treatment or other patient management decisions. Negative results must be combined with clinical observations, patient history, and epidemiological information. The expected result is Negative. Fact Sheet for Patients: HairSlick.no Fact Sheet for Healthcare Providers: quierodirigir.com This test is not yet approved or cleared by the [place] FDA and  has been authorized for detection and/or diagnosis of SARS-CoV-2 by FDA under an Emergency Use Authorization (EUA). This EUA will remain  in effect (meaning this test can be used) for the duration of the COVID-19 declaration under Section 56 4(b)(1) of the Act, 21 U.S.C. section 360bbb-3(b)(1), unless the authorization is terminated or revoked sooner. Performed at [facility], [address], [place], [place] [address]   MRSA PCR Screening     Status: None   Collection Time: 12/31/18  8:42 AM   Specimen: Nasal Mucosa; Nasopharyngeal  Result Value Ref Range Status   MRSA by PCR NEGATIVE NEGATIVE Final    Comment:        The GeneXpert MRSA Assay (FDA approved for NASAL specimens only), is one component of a comprehensive MRSA colonization surveillance program. It is not intended to diagnose MRSA infection nor to guide or monitor treatment for MRSA infections. Performed at [facility], [address] [place], [place] [address]   MRSA PCR Screening     Status: None   Collection Time: 01/03/2019  2:10 AM   Specimen: Nasal Mucosa; Nasopharyngeal  Result Value Ref Range Status   MRSA by PCR NEGATIVE NEGATIVE Final    Comment:        The GeneXpert MRSA Assay (FDA approved for NASAL specimens only), is one component of a comprehensive MRSA colonization surveillance program. It is not intended to diagnose MRSA infection nor to guide or monitor treatment for MRSA infections. Performed at [facility], [address] [place], [place]  [address]   Culture, respiratory (non-expectorated)     Status: None   Collection Time: 12/17/2018  3:49 AM   Specimen: Tracheal Aspirate; Respiratory  Result Value Ref Range Status   Specimen Description   Final    TRACHEAL ASPIRATE Performed at [facility], [address] [place], [place] [address]    Special Requests   Final    NONE Performed at [facility], [address] [place], [place] [address]    Gram Stain   Final    RARE WBC PRESENT, PREDOMINANTLY PMN MODERATE YEAST Performed at [facility], [address] [place], [place] [address]    Culture MODERATE CANDIDA GLABRATA  Final   Report Status 12/22/2018 FINAL  Final  CULTURE, BLOOD (ROUTINE X 2) w Reflex to ID Panel     Status: None (Preliminary result)   Collection Time: 12/27/2018  4:41 AM   Specimen: BLOOD  Result Value Ref Range Status   Specimen Description BLOOD LEFT ANTECUBITAL  Final   Special Requests   Final    BOTTLES DRAWN AEROBIC AND ANAEROBIC Blood Culture adequate volume   Culture   Final    NO GROWTH 4 DAYS Performed at [facility], [address], [place], [place] [address]  Report Status PENDING  Incomplete  CULTURE, BLOOD (ROUTINE X 2) w Reflex to ID Panel     Status: None (Preliminary result)   Collection Time: 12/15/2018  4:41 AM   Specimen: BLOOD  Result Value Ref Range Status   Specimen Description BLOOD LEFT FOREARM  Final   Special Requests   Final    BOTTLES DRAWN AEROBIC AND ANAEROBIC Blood Culture adequate volume   Culture   Final    NO GROWTH 4 DAYS Performed at [facility], [address] [place], [place] [address]    Report Status PENDING  Incomplete    Radiology Reports Ct Abdomen Pelvis Wo Contrast  Result Date: 12/25/2018 CLINICAL DATA:  Abdominal distention. EXAM: CT ABDOMEN AND PELVIS WITHOUT CONTRAST TECHNIQUE: Multidetector CT imaging of the abdomen and pelvis was performed following the standard protocol without IV  contrast. COMPARISON:  Earlier today FINDINGS: Lower chest: Bilateral pleural effusions identified left greater than right. There is also subsegmental atelectasis overlying bilateral effusions. Three vessel coronary artery atherosclerotic calcifications. Aortic atherosclerosis. Hepatobiliary: No focal liver abnormality is seen. No gallstones, gallbladder wall thickening, or biliary dilatation. Pancreas: Unremarkable. No pancreatic ductal dilatation or surrounding inflammatory changes. Spleen: Normal in size without focal abnormality. Adrenals/Urinary Tract: Normal appearance of the adrenal glands. Bilateral nephrolithiasis identified. There is left renal atrophy. New from previous exam. Left-sided hydronephrosis and hydroureter is identified. Stone within the left ureter at the approximate L5-S1 level measures 8 mm, image 55/2. There are 2 small stones within the dependent portion of the bladder measuring up to 4 mm. Stomach/Bowel: The nasogastric tube tip extends beyond the margin of the wall of the gastric fundus and resides in the peritoneal cavity. Extensive pneumoperitoneum is identified. Additionally, free fluid with dilute extravasated enteric contrast material is identified within the peritoneal cavity of the abdomen and pelvis. There is increase caliber of the proximal small bowel loops which measure up to 4.2 cm. Multiple small bowel fluid levels are identified. The appendix is visualized and appears normal. Normal appearance of the colon. Vascular/Lymphatic: Aortic atherosclerosis. No aneurysm. No abdominopelvic adenopathy. Reproductive: Prostate gland is enlarged containing calcifications and a focal area of low-density in the right posterior gland. Other: Large volume pneumoperitoneum, free fluid, and extravasated enteric contrast material. Musculoskeletal: No acute or significant osseous findings. IMPRESSION: 1. The nasogastric tube tip extends beyond the margin of the wall of the gastric fundus and  resides in the peritoneal cavity. Large volume of pneumoperitoneum and free fluid identified within the abdomen and pelvis. Findings are compatible with bowel perforation. 2. Left-sided hydronephrosis and hydroureter secondary to 8 mm left ureteral calculus. 3. Three vessel coronary artery calcifications noted. 4. Bilateral pleural effusions and overlying subsegmental atelectasis. 5. Enlarged prostate gland with focal area of low-density within the right posterior gland. Correlate for any clinical signs or symptoms of prostatitis. 6. Left renal atrophy. 7. Bilateral nephrolithiasis. Aortic Atherosclerosis (ICD10-I70.0). Electronically Signed   By: Signa Kell M.D.   On: 01/11/2019 06:47   Dg Abd 1 View  Result Date: 12/25/2018 CLINICAL DATA:  78 year old male status post enteric tube placement. EXAM: PORTABLE CHEST 1 VIEW COMPARISON:  Chest radiograph dated 12/14/2018 FINDINGS: Endotracheal tube approximately 4 cm above the carina. An enteric tube extends into the left upper abdomen with tip likely in the stomach. Probable small left pleural effusion and associated atelectatic changes of the left lung base. No focal consolidation or pneumothorax. Mild cardiomegaly. There is apparent outlining of the bowel wall with air (Rigler's sign) concerning  for pneumoperitoneum. Further evaluation with CT is recommended. There is degenerative changes of the spine. IMPRESSION: 1. Endotracheal tube above the carina. 2. Enteric tube with tip in the stomach. 3. Findings concerning for pneumoperitoneum. Further evaluation with CT is recommended. 4. Probable small left pleural effusion and left lung base atelectasis. These results were called by telephone at the time of interpretation on 12/15/2018 at 3:47 am to provider [facility] , who verbally acknowledged these results. Electronically Signed   By: Elgie CollardArash  Radparvar M.D.   On: 01/11/2019 03:50   Ct Head Wo Contrast  Result Date: 01/02/2019 CLINICAL DATA:  Altered  level of consciousness and weakness EXAM: CT HEAD WITHOUT CONTRAST TECHNIQUE: Contiguous axial images were obtained from the base of the skull through the vertex without intravenous contrast. COMPARISON:  08/24/2017 FINDINGS: Brain: Considerable motion artifact is identified with severe limitations on the exam. There is persistent increased density along the anterior cranial fossa particularly on the left similar to that seen on the prior exam. Considerable beam hardening artifact is identified limiting the examination although the persistent density is suspicious for underlying meningioma. If the patient can undergo MRI examination that would be helpful. Diffuse atrophic changes are seen. Chronic white matter ischemic changes are noted. No findings to suggest acute hemorrhage or acute infarction are seen. Vascular: No hyperdense vessel or unexpected calcification. Skull: Normal. Negative for fracture or focal lesion. Sinuses/Orbits: No acute finding. Other: Stable fluid within the mastoid air cells on the left. IMPRESSION: Chronic atrophic and ischemic changes stable from previous exam. Persistent increased density is noted along the base of the anterior cranial fossa particularly on the left. Although this is likely related to underlying beam hardening artifact the possibility of a meningioma could not be totally excluded. MRI would be helpful in this regard. Electronically Signed   By: Alcide CleverMark  Lukens M.D.   On: 01/02/2019 10:54   Dg Chest Port 1 View  Result Date: 01/09/2019 CLINICAL DATA:  Unsuccessful attempt at left IJ catheter placement. Rule out PTX EXAM: PORTABLE CHEST 1 VIEW COMPARISON:  12/22/2018 FINDINGS: ET tube tip is above the carina. There is a enteric tube with tip below the field of view. No pneumothorax following attempted IJ catheter placement. Normal heart size. Decreased lung volumes. Small bilateral pleural effusions noted with overlying compressive type atelectasis. IMPRESSION: 1. No  pneumothorax after attempted IJ catheter placement. 2. Small bilateral pleural effusions with overlying compressive type atelectasis. 3. The enteric tube tip is above the carina. Electronically Signed   By: Signa Kellaylor  Stroud M.D.   On: 01/07/2019 08:34   Dg Chest Port 1 View  Result Date: 12/17/2018 CLINICAL DATA:  78 year old male status post enteric tube placement. EXAM: PORTABLE CHEST 1 VIEW COMPARISON:  Chest radiograph dated 12/15/2018 FINDINGS: Endotracheal tube approximately 4 cm above the carina. An enteric tube extends into the left upper abdomen with tip likely in the stomach. Probable small left pleural effusion and associated atelectatic changes of the left lung base. No focal consolidation or pneumothorax. Mild cardiomegaly. There is apparent outlining of the bowel wall with air (Rigler's sign) concerning for pneumoperitoneum. Further evaluation with CT is recommended. There is degenerative changes of the spine. IMPRESSION: 1. Endotracheal tube above the carina. 2. Enteric tube with tip in the stomach. 3. Findings concerning for pneumoperitoneum. Further evaluation with CT is recommended. 4. Probable small left pleural effusion and left lung base atelectasis. These results were called by telephone at the time of interpretation on 12/15/2018 at 3:47 am to  provider Sonda Rumble , who verbally acknowledged these results. Electronically Signed   By: Elgie Collard M.D.   On: 2019-01-24 03:50   Dg Chest Portable 1 View  Result Date: 01/10/2019 CLINICAL DATA:  Chest pain, shortness of breath. EXAM: PORTABLE CHEST 1 VIEW COMPARISON:  None. FINDINGS: The heart size and mediastinal contours are within normal limits. Right lung is clear. No pneumothorax is noted. Mild left basilar atelectasis is noted with probable small left pleural effusion. The visualized skeletal structures are unremarkable. IMPRESSION: Mild left basilar subsegmental atelectasis with probable small left pleural effusion.  Electronically Signed   By: Lupita Raider M.D.   On: 01/07/2019 15:52     CBC Recent Labs  Lab 2019/01/24 0441 01/06/19 1936 01/07/19 0415 12/25/2018 0425  WBC 8.3 18.3* 21.0* 12.0*  HGB 12.8* 10.8* 11.2* 9.7*  HCT 40.1 32.0* 33.8* 28.6*  PLT 203 187 191 124*  MCV 96.4 92.8 93.1 91.1  MCH 30.8 31.3 30.9 30.9  MCHC 31.9 33.8 33.1 33.9  RDW 14.6 14.8 14.8 14.6  LYMPHSABS 0.4* 1.5  --   --   MONOABS 0.2 0.0*  --   --   EOSABS 0.0 0.0  --   --   BASOSABS 0.1 0.0  --   --     Chemistries  Recent Labs  Lab Jan 24, 2019 0441 01/06/19 2125 01/07/19 0415 12/24/2018 0425  NA 155* 147* 148* 149*  K 4.0 3.2* 3.8 3.1*  CL 125* 122* 123* 124*  CO2 16* 13* 13* 13*  GLUCOSE 131* 251* 214* 153*  BUN 107* 106* 115* 125*  CREATININE 3.31* 3.84* 3.80* 3.96*  CALCIUM 8.4* 6.4* 6.7* 6.3*  MG 2.7*  --  2.1  --   AST 90*  --   --   --   ALT 6  --   --   --   ALKPHOS 66  --   --   --   BILITOT 0.7  --   --   --    ------------------------------------------------------------------------------------------------------------------ estimated creatinine clearance is 13.4 mL/min (A) (by C-G formula based on SCr of 3.96 mg/dL (H)). ------------------------------------------------------------------------------------------------------------------ Recent Labs    01/06/19 1936  HGBA1C 5.4   ------------------------------------------------------------------------------------------------------------------ No results for input(s): CHOL, HDL, LDLCALC, TRIG, CHOLHDL, LDLDIRECT in the last 72 hours. ------------------------------------------------------------------------------------------------------------------ No results for input(s): TSH, T4TOTAL, T3FREE, THYROIDAB in the last 72 hours.  Invalid input(s): FREET3 ------------------------------------------------------------------------------------------------------------------ No results for input(s): VITAMINB12, FOLATE, FERRITIN, TIBC, IRON, RETICCTPCT in  the last 72 hours.  Coagulation profile No results for input(s): INR, PROTIME in the last 168 hours.  No results for input(s): DDIMER in the last 72 hours.  Cardiac Enzymes No results for input(s): CKMB, TROPONINI, MYOGLOBIN in the last 168 hours.  Invalid input(s): CK ------------------------------------------------------------------------------------------------------------------ Invalid input(s): POCBNP    Assessment & Plan    78 yr old male patient with a known history of parkinsons disease, BPH , hyperlipidemia presents to ER with generalized weakness and difficulty breathing. Patient also complaints of chest discomfort on deep breathing..   -Acute respiratory failure with cardiac arrest needing cardiac resuscitation Continue ventilator Appreciate ICU team help Very poor prognosis on multiple pressors  -Septic shock Continue broad-spectrum antibiotics with Zosyn  -Hypotension  continue pressors  -Status post surgery status post gastric ulcer repair with open gGastrostomy tube    -Parkinson's with worsening has been seen by neurology Continue supportive care   -Dysphagia patient has severe dysphagia has pocketing his food.    Prognosis very poor in this 78 year old  with worsening Parkinson's now on the ventilator multiple pressors status post surgery  Recommend comfort measures        Code Status Orders  (From admission, onward)         Start     Ordered   01/10/2019 2021  Full code  Continuous     12/15/2018 2020        Code Status History    This patient has a current code status but no historical code status.   Advance Care Planning Activity    Advance Directive Documentation     Most Recent Value  Type of Advance Directive  Living will  Pre-existing out of facility DNR order (yellow form or pink MOST form)  -  "MOST" Form in Place?  -           Consults none  DVT Prophylaxis SCDs Lab Results  Component Value Date   PLT 124 (L)  09-Jan-2019     Time Spent in minutes 35 minutes  Greater than 50% of time spent    Neita Carp M.D on 01/09/2019 at 7:03 AM  Between 7am to 6pm - Pager - [phone]  After 6pm go to [email or web address] - Proofreader  [employer]   Office  [phone]

## 2019-01-10 LAB — CULTURE, BLOOD (ROUTINE X 2)
Culture: NO GROWTH
Culture: NO GROWTH
Special Requests: ADEQUATE
Special Requests: ADEQUATE

## 2019-01-12 NOTE — Progress Notes (Signed)
Pharmacy Antibiotic Note  AYSON Peterson is a 78 y.o. male admitted on 12/15/2018 with aspiration pneumonia and intra-abdominal infection.  Pharmacy has been consulted for Zosyn dosing.  Plan: -The dose of Zosyn 3.375g IV q8h will be adjusted to 3.375g IV q12h based on renal function.  -SCr: 3.96, CcCl:13.4 -Continue Zosyn 3.375g IV q12h, day 3  -Continue Anidulafungin 100mg  IV daily, day 3  Height: 5' 5.98" (167.6 cm) Weight: 162 lb 9.6 oz (73.8 kg) IBW/kg (Calculated) : 63.76  Temp (24hrs), Avg:98.6 F (37 C), Min:98 F (36.7 C), Max:99.2 F (37.3 C)  Recent Labs  Lab 12/15/2018 0441 01/06/19 1936 01/06/19 2125 01/07/19 0415 24-Jan-2019 0425  WBC 8.3 18.3*  --  21.0* 12.0*  CREATININE 3.31*  --  3.84* 3.80* 3.96*  LATICACIDVEN 2.4*  --   --   --   --     Estimated Creatinine Clearance: 13.4 mL/min (A) (by C-G formula based on SCr of 3.96 mg/dL (H)).    No Known Allergies  Antimicrobials this admission: Unasyn 10/20 >> 10/25 Zosyn 10/25 >>  Anidulafungin 10/25 >>  Microbiology results: 10/25 BCx: No growth x3 day, final pending  10/25 Sputum: Moderate Candida Glabrata, Final 10/25 MRSA PCR: Negative 10/20 MRSA PCR: Negative 10/19 SARS-CoV-2: Negative 10/19 BCx: No growth x5 day, final   Thank you for allowing pharmacy to be a part of this patient's care.  Sallye Lat, PharmD Candidate 01/24/19 11:04 AM

## 2019-01-12 NOTE — Progress Notes (Signed)
Palliative:  HPI:78 y.o.malewith past medical history of Parkinson's disease, BPH, HLDadmitted on 10/08/2020with SOB, chest pain, weakness withlikelyaspiration pneumonia.He continues to decline with worsening swallow function during hospitalization.He had decline with perforated gastric ulcer s/p surgical intervention 12/17/2018 along with cardiac arrest. Currently intubated in ICU with multiorgan failure and no expectation for meaningful recovery. Extubated to comfort care 12/27/2018.    I spoke today with son, [name], and daughter, Cindy. [name] has arranged for anatomical gift and has sorted everything and prepared to move forward with extubation and full comfort care. After speaking with family we have planned on extubation and comfort care this afternoon between 2-3 pm.   I met with family at bedside and Mr. Alan Peterson was extubated to comfort at 1500. He was premedicated and expectations of signs/symptoms at EOL, prognosis of hours to days, and symptom management. He remained stable after extubation. All questions/concerns addressed. Emotional support provided.   Exam: Unresponsive. Extubated but breathing regular, unlabored, shallow. Abd with incision intact, JP x2, soft. Extremities warm to touch.   Plan: - Extubated to full comfort care. Family at bedside.  - Medications added for liberalized PRN to ensure comfort.  - Anticipate hospital death.   60 min   , NP Palliative Medicine Team Pager [phone] (Please see [email or web address] for schedule) Team Phone [phone]    Greater than 50%  of this time was spent counseling and coordinating care related to the above assessment and plan  

## 2019-01-12 NOTE — Progress Notes (Signed)
Patient presented with asystole on central monitoring. Heart and lung sounds auscultated and absent. RN Marshall Cork and RN Margreta Journey pronounced time of expire 1720. MD Kasa notified and patient family at bedside during expiration.

## 2019-01-12 NOTE — Progress Notes (Signed)
CRITICAL CARE NOTE   78 yo WM with acute and severe acidosis from acute gastric ulcer perforation with severe septic shock and multiorgan failure s/p cardiac arrest   CC  Severe resp failure  SUBJECTIVE Multiorgan failure Signs of brain damage On multiple vasopressors Prognosis is grave    BP (!) 105/55 (BP Location: Right Arm)   Pulse (!) 48   Temp 98.5 F (36.9 C) (Axillary)   Resp (!) 22   Ht 5' 5.98" (1.676 m)   Wt 73.8 kg   SpO2 99%   BMI 26.26 kg/m    I/O last 3 completed shifts: In: 2224.2 [I.V.:1580.6; IV Piggyback:643.7] Out: 680 [Drains:680] No intake/output data recorded.  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 Large volume of pneumoperitoneum and free fluid identified within the abdomen and pelvis. Findings are compatible with bowel perforation 10/25 Findings: 1. Large quantity of bilious andturbid fluid identified(2250 mL). 2.Pre pyloric 1 cm ulcer identified and able to be closed and patched.  3. Patient did not deteriorated during surgery 10/26 critically ill, multiple vasopressors    REVIEW OF SYSTEMS  PATIENT IS UNABLE TO PROVIDE COMPLETE REVIEW OF SYSTEM S DUE TO SEVERE CRITICAL ILLNESS AND ENCEPHALOPATHY   PHYSICAL EXAMINATION:  GENERAL:critically ill appearing, +resp distress HEAD: Normocephalic, atraumatic.  EYES: Pupils equal, round, reactive to light.  No scleral icterus.  MOUTH: Moist mucosal membrane. NECK: Supple. No thyromegaly. No nodules. No JVD.  PULMONARY: +rhonchi, +wheezing CARDIOVASCULAR: S1 and S2. Regular rate and rhythm. No murmurs, rubs, or gallops.  GASTROINTESTINAL: Soft,  nontender, -distended. Positive bowel sounds.  MUSCULOSKELETAL: No swelling, clubbing, or edema.  NEUROLOGIC: obtunded SKIN:intact,warm,dry    MEDICATIONS: I have reviewed all medications and confirmed regimen as documented   CULTURE RESULTS   Recent Results (from the past 240 hour(s))  Blood Culture (routine x 2)     Status: None   Collection Time: 12/28/2018  4:18 PM   Specimen: BLOOD  Result Value Ref Range Status   Specimen Description BLOOD LEFT ANTECUBITAL  Final   Special Requests   Final    BOTTLES DRAWN AEROBIC AND ANAEROBIC Blood Culture adequate volume   Culture   Final    NO GROWTH 5 DAYS Performed at [facility], [address] [place], [place] [address]    Report Status 01/04/2019 FINAL  Final  Blood Culture (routine x 2)     Status: None   Collection Time: 01/07/2019  4:23 PM   Specimen: BLOOD  Result Value Ref Range Status   Specimen Description BLOOD BLOOD RIGHT FOREARM  Final   Special Requests   Final    BOTTLES DRAWN AEROBIC AND ANAEROBIC Blood Culture adequate volume   Culture   Final    NO GROWTH 5 DAYS Performed at [facility], [address] [place], [place] [address]    Report Status 01/04/2019 FINAL  Final  SARS CORONAVIRUS 2 (TAT 6-24 HRS) Nasopharyngeal Nasopharyngeal Swab     Status: None   Collection Time: 01/01/2019  4:45 PM   Specimen: Nasopharyngeal Swab  Result Value Ref Range Status   SARS Coronavirus 2 NEGATIVE NEGATIVE Final    Comment: (NOTE) SARS-CoV-2 target nucleic acids  are NOT DETECTED. The SARS-CoV-2 RNA is generally detectable in upper and lower respiratory specimens during the acute phase of infection. Negative results do not preclude SARS-CoV-2 infection, do not rule out co-infections with other pathogens, and should not be used as the sole basis for treatment or other patient management decisions. Negative results must be combined with clinical observations, patient history, and epidemiological  information. The expected result is Negative. Fact Sheet for Patients: HairSlick.no Fact Sheet for Healthcare Providers: quierodirigir.com This test is not yet approved or cleared by the [place] FDA and  has been authorized for detection and/or diagnosis of SARS-CoV-2 by FDA under an Emergency Use Authorization (EUA). This EUA will remain  in effect (meaning this test can be used) for the duration of the COVID-19 declaration under Section 56 4(b)(1) of the Act, 21 U.S.C. section 360bbb-3(b)(1), unless the authorization is terminated or revoked sooner. Performed at [facility], [address], [place], [place] [address]   MRSA PCR Screening     Status: None   Collection Time: 12/31/18  8:42 AM   Specimen: Nasal Mucosa; Nasopharyngeal  Result Value Ref Range Status   MRSA by PCR NEGATIVE NEGATIVE Final    Comment:        The GeneXpert MRSA Assay (FDA approved for NASAL specimens only), is one component of a comprehensive MRSA colonization surveillance program. It is not intended to diagnose MRSA infection nor to guide or monitor treatment for MRSA infections. Performed at [facility], [address] [place], [place] [address]   MRSA PCR Screening     Status: None   Collection Time: 12/15/2018  2:10 AM   Specimen: Nasal Mucosa; Nasopharyngeal  Result Value Ref Range Status   MRSA by PCR NEGATIVE NEGATIVE Final    Comment:        The GeneXpert MRSA Assay (FDA approved for NASAL specimens only), is one component of a comprehensive MRSA colonization surveillance program. It is not intended to diagnose MRSA infection nor to guide or monitor treatment for MRSA infections. Performed at [facility], [address] [place], [place] [address]   Culture, respiratory (non-expectorated)     Status: None (Preliminary result)   Collection Time: 12/18/2018  3:49 AM   Specimen: Tracheal Aspirate;  Respiratory  Result Value Ref Range Status   Specimen Description   Final    TRACHEAL ASPIRATE Performed at [facility], [address] [place], [place] [address]    Special Requests   Final    NONE Performed at [facility], [address] [place], [place] [address]    Gram Stain   Final    RARE WBC PRESENT, PREDOMINANTLY PMN MODERATE YEAST    Culture   Final    CULTURE REINCUBATED FOR BETTER GROWTH Performed at [facility], [address], [place], [place] [address]    Report Status PENDING  Incomplete  CULTURE, BLOOD (ROUTINE X 2) w Reflex to ID Panel     Status: None (Preliminary result)   Collection Time: 12/13/2018  4:41 AM   Specimen: BLOOD  Result Value Ref Range Status   Specimen Description BLOOD LEFT ANTECUBITAL  Final   Special Requests   Final    BOTTLES DRAWN AEROBIC AND ANAEROBIC Blood Culture adequate volume   Culture   Final    NO GROWTH 3 DAYS Performed at [facility], [address] [place], [place] [address]    Report Status PENDING  Incomplete  CULTURE,  BLOOD (ROUTINE X 2) w Reflex to ID Panel     Status: None (Preliminary result)   Collection Time: 01/29/19  4:41 AM   Specimen: BLOOD  Result Value Ref Range Status   Specimen Description BLOOD LEFT FOREARM  Final   Special Requests   Final    BOTTLES DRAWN AEROBIC AND ANAEROBIC Blood Culture adequate volume   Culture   Final    NO GROWTH 3 DAYS Performed at [facility], [address] [place], [place] [address]    Report Status PENDING  Incomplete              Indwelling Urinary Catheter continued, requirement due to   Reason to continue Indwelling Urinary Catheter strict Intake/Output monitoring for hemodynamic instability   Central Line/ continued, requirement due to  Reason to continue [place] of central venous pressure or other hemodynamic parameters and poor IV access   Ventilator continued, requirement due to severe  respiratory failure   Ventilator Sedation RASS 0 to -2       ASSESSMENT AND PLAN SYNOPSIS  SYNOPSIS 78 yo WM with acute and severe acidosis from acute gastric ulcer perforation with severe septic shock and multiorgan failure s/p cardiac arrest   Severe ACUTE Hypoxic and Hypercapnic Respiratory Failure -continue Mechanical Ventilator support -continue Bronchodilator Therapy -Wean Fio2 and PEEP as tolerated -VAP/VENT bundle implementation  ACUTE KIDNEY INJURY/Renal Failure -follow chem 7 -follow UO -continue Foley Catheter-assess need -Avoid nephrotoxic agents   NEUROLOGY - intubated and sedated - minimal sedation to achieve a RASS goal: -1 Showing signs of brain damage Not on ssedation   Septic shock -use vasopressors to keep MAP>65 -follow ABG and LA -follow up cultures -emperic ABX - stress dose steroids  INFECTIOUS DISEASE -continue antibiotics as prescribed -follow up cultures    DVT/GI PRX ordered TRANSFUSIONS AS NEEDED MONITOR FSBS ASSESS the need for LABS as needed   GI GI PROPHYLAXIS as indicated  NUTRITIONAL STATUS DIET-->NPO Constipation protocol as indicated  ELECTROLYTES -follow labs as needed -replace as needed -pharmacy consultation and following    Critical Care Time devoted to patient care services described in this note is 35 minutes.   Overall, patient is critically ill, prognosis is guarded.  Patient with Multiorgan failure and at high risk for cardiac arrest and death.   DNR status Recommend Comfort care measures Palliative care consulted   Micheline Markes Patricia Pesa, M.D.  [facility]  Medical Director [place] Director [facility] Cardio-Pulmonary Department

## 2019-01-12 NOTE — Death Summary Note (Signed)
DEATH SUMMARY   Patient Details  Name: Alan Peterson MRN: [id number] DOB: 29-Dec-1940  Admission/Discharge Information   Admit Date:  12/12/2018  Date of Death: Date of Death: 12/21/18  Time of Death: Time of Death: 1720  Length of Stay: 9  Referring Physician: Karie SchwalbeLetvak, Richard I, MD     Diagnoses  Preliminary cause of death: gastric ulcer perforation, metabolic acidosis Secondary Diagnoses (including complications and co-morbidities):  Active Problems:   Sepsis (HCC)   Aspiration pneumonia of left lower lobe (HCC)   Dysphagia   Parkinson disease (HCC)   Cardiac arrest [facility](HCC)   Brief Hospital Course (including significant findings, care, treatment, and services provided and events leading to death)  Admitted for gastric uler perforation Cardiac arrest Severe acidosis Vent support Vasopressor support Multiorgan failure   Updated and notified of patients medical condition-  Progressive multiorgan failure with very low chance of meaningful recovery.  Patient is in dying  Process.  Family understands the situation.  They have consented and agreed to DNR/DNI and would like to proceed with Comfort care measures.   Family are satisfied with Plan of action and management. All questions answered        Pertinent Labs and Studies  Significant Diagnostic Studies Ct Abdomen Pelvis Wo Contrast  Result Date: 12/25/2018 CLINICAL DATA:  Abdominal distention. EXAM: CT ABDOMEN AND PELVIS WITHOUT CONTRAST TECHNIQUE: Multidetector CT imaging of the abdomen and pelvis was performed following the standard protocol without IV contrast. COMPARISON:  Earlier today FINDINGS: Lower chest: Bilateral pleural effusions identified left greater than right. There is also subsegmental atelectasis overlying bilateral effusions. Three vessel coronary artery atherosclerotic calcifications. Aortic atherosclerosis. Hepatobiliary: No focal liver abnormality is seen. No gallstones, gallbladder  wall thickening, or biliary dilatation. Pancreas: Unremarkable. No pancreatic ductal dilatation or surrounding inflammatory changes. Spleen: Normal in size without focal abnormality. Adrenals/Urinary Tract: Normal appearance of the adrenal glands. Bilateral nephrolithiasis identified. There is left renal atrophy. New from previous exam. Left-sided hydronephrosis and hydroureter is identified. Stone within the left ureter at the approximate L5-S1 level measures 8 mm, image 55/2. There are 2 small stones within the dependent portion of the bladder measuring up to 4 mm. Stomach/Bowel: The nasogastric tube tip extends beyond the margin of the wall of the gastric fundus and resides in the peritoneal cavity. Extensive pneumoperitoneum is identified. Additionally, free fluid with dilute extravasated enteric contrast material is identified within the peritoneal cavity of the abdomen and pelvis. There is increase caliber of the proximal small bowel loops which measure up to 4.2 cm. Multiple small bowel fluid levels are identified. The appendix is visualized and appears normal. Normal appearance of the colon. Vascular/Lymphatic: Aortic atherosclerosis. No aneurysm. No abdominopelvic adenopathy. Reproductive: Prostate gland is enlarged containing calcifications and a focal area of low-density in the right posterior gland. Other: Large volume pneumoperitoneum, free fluid, and extravasated enteric contrast material. Musculoskeletal: No acute or significant osseous findings. IMPRESSION: 1. The nasogastric tube tip extends beyond the margin of the wall of the gastric fundus and resides in the peritoneal cavity. Large volume of pneumoperitoneum and free fluid identified within the abdomen and pelvis. Findings are compatible with bowel perforation. 2. Left-sided hydronephrosis and hydroureter secondary to 8 mm left ureteral calculus. 3. Three vessel coronary artery calcifications noted. 4. Bilateral pleural effusions and overlying  subsegmental atelectasis. 5. Enlarged prostate gland with focal area of low-density within the right posterior gland. Correlate for any clinical signs or symptoms of prostatitis. 6. Left renal atrophy. 7. Bilateral nephrolithiasis.  Aortic Atherosclerosis (ICD10-I70.0). Electronically Signed   By: Signa Kell M.D.   On: 06-Jan-2019 06:47   Dg Abd 1 View  Result Date: 01/06/19 CLINICAL DATA:  78 year old male status post enteric tube placement. EXAM: PORTABLE CHEST 1 VIEW COMPARISON:  Chest radiograph dated 01/10/2019 FINDINGS: Endotracheal tube approximately 4 cm above the carina. An enteric tube extends into the left upper abdomen with tip likely in the stomach. Probable small left pleural effusion and associated atelectatic changes of the left lung base. No focal consolidation or pneumothorax. Mild cardiomegaly. There is apparent outlining of the bowel wall with air (Rigler's sign) concerning for pneumoperitoneum. Further evaluation with CT is recommended. There is degenerative changes of the spine. IMPRESSION: 1. Endotracheal tube above the carina. 2. Enteric tube with tip in the stomach. 3. Findings concerning for pneumoperitoneum. Further evaluation with CT is recommended. 4. Probable small left pleural effusion and left lung base atelectasis. These results were called by telephone at the time of interpretation on January 06, 2019 at 3:47 am to provider [facility] , who verbally acknowledged these results. Electronically Signed   By: Elgie Collard M.D.   On: 01-06-2019 03:50   Ct Head Wo Contrast  Result Date: 01/02/2019 CLINICAL DATA:  Altered level of consciousness and weakness EXAM: CT HEAD WITHOUT CONTRAST TECHNIQUE: Contiguous axial images were obtained from the base of the skull through the vertex without intravenous contrast. COMPARISON:  08/24/2017 FINDINGS: Brain: Considerable motion artifact is identified with severe limitations on the exam. There is persistent increased density along the  anterior cranial fossa particularly on the left similar to that seen on the prior exam. Considerable beam hardening artifact is identified limiting the examination although the persistent density is suspicious for underlying meningioma. If the patient can undergo MRI examination that would be helpful. Diffuse atrophic changes are seen. Chronic white matter ischemic changes are noted. No findings to suggest acute hemorrhage or acute infarction are seen. Vascular: No hyperdense vessel or unexpected calcification. Skull: Normal. Negative for fracture or focal lesion. Sinuses/Orbits: No acute finding. Other: Stable fluid within the mastoid air cells on the left. IMPRESSION: Chronic atrophic and ischemic changes stable from previous exam. Persistent increased density is noted along the base of the anterior cranial fossa particularly on the left. Although this is likely related to underlying beam hardening artifact the possibility of a meningioma could not be totally excluded. MRI would be helpful in this regard. Electronically Signed   By: Alcide Clever M.D.   On: 01/02/2019 10:54   Dg Chest Port 1 View  Result Date: Jan 06, 2019 CLINICAL DATA:  Unsuccessful attempt at left IJ catheter placement. Rule out PTX EXAM: PORTABLE CHEST 1 VIEW COMPARISON:  06-Jan-2019 FINDINGS: ET tube tip is above the carina. There is a enteric tube with tip below the field of view. No pneumothorax following attempted IJ catheter placement. Normal heart size. Decreased lung volumes. Small bilateral pleural effusions noted with overlying compressive type atelectasis. IMPRESSION: 1. No pneumothorax after attempted IJ catheter placement. 2. Small bilateral pleural effusions with overlying compressive type atelectasis. 3. The enteric tube tip is above the carina. Electronically Signed   By: Signa Kell M.D.   On: 01-06-19 08:34   Dg Chest Port 1 View  Result Date: 2019/01/06 CLINICAL DATA:  78 year old male status post enteric tube  placement. EXAM: PORTABLE CHEST 1 VIEW COMPARISON:  Chest radiograph dated 12/20/2018 FINDINGS: Endotracheal tube approximately 4 cm above the carina. An enteric tube extends into the left upper abdomen with tip likely  in the stomach. Probable small left pleural effusion and associated atelectatic changes of the left lung base. No focal consolidation or pneumothorax. Mild cardiomegaly. There is apparent outlining of the bowel wall with air (Rigler's sign) concerning for pneumoperitoneum. Further evaluation with CT is recommended. There is degenerative changes of the spine. IMPRESSION: 1. Endotracheal tube above the carina. 2. Enteric tube with tip in the stomach. 3. Findings concerning for pneumoperitoneum. Further evaluation with CT is recommended. 4. Probable small left pleural effusion and left lung base atelectasis. These results were called by telephone at the time of interpretation on 12/19/2018 at 3:47 am to provider [facility] , who verbally acknowledged these results. Electronically Signed   By: Elgie Collard M.D.   On: 12/26/2018 03:50   Dg Chest Portable 1 View  Result Date: 12/18/2018 CLINICAL DATA:  Chest pain, shortness of breath. EXAM: PORTABLE CHEST 1 VIEW COMPARISON:  None. FINDINGS: The heart size and mediastinal contours are within normal limits. Right lung is clear. No pneumothorax is noted. Mild left basilar atelectasis is noted with probable small left pleural effusion. The visualized skeletal structures are unremarkable. IMPRESSION: Mild left basilar subsegmental atelectasis with probable small left pleural effusion. Electronically Signed   By: Lupita Raider M.D.   On: 01/04/2019 15:52    Microbiology Recent Results (from the past 240 hour(s))  Blood Culture (routine x 2)     Status: None   Collection Time: 12/22/2018  4:18 PM   Specimen: BLOOD  Result Value Ref Range Status   Specimen Description BLOOD LEFT ANTECUBITAL  Final   Special Requests   Final    BOTTLES DRAWN  AEROBIC AND ANAEROBIC Blood Culture adequate volume   Culture   Final    NO GROWTH 5 DAYS Performed at [facility], [address], [place], [place] [address]    Report Status 01/04/2019 FINAL  Final  Blood Culture (routine x 2)     Status: None   Collection Time: 01/05/2019  4:23 PM   Specimen: BLOOD  Result Value Ref Range Status   Specimen Description BLOOD BLOOD RIGHT FOREARM  Final   Special Requests   Final    BOTTLES DRAWN AEROBIC AND ANAEROBIC Blood Culture adequate volume   Culture   Final    NO GROWTH 5 DAYS Performed at [facility], [address] [place], [place] [address]    Report Status 01/04/2019 FINAL  Final  SARS CORONAVIRUS 2 (TAT 6-24 HRS) Nasopharyngeal Nasopharyngeal Swab     Status: None   Collection Time: 12/25/2018  4:45 PM   Specimen: Nasopharyngeal Swab  Result Value Ref Range Status   SARS Coronavirus 2 NEGATIVE NEGATIVE Final    Comment: (NOTE) SARS-CoV-2 target nucleic acids are NOT DETECTED. The SARS-CoV-2 RNA is generally detectable in upper and lower respiratory specimens during the acute phase of infection. Negative results do not preclude SARS-CoV-2 infection, do not rule out co-infections with other pathogens, and should not be used as the sole basis for treatment or other patient management decisions. Negative results must be combined with clinical observations, patient history, and epidemiological information. The expected result is Negative. Fact Sheet for Patients: HairSlick.no Fact Sheet for Healthcare Providers: quierodirigir.com This test is not yet approved or cleared by the [place] FDA and  has been authorized for detection and/or diagnosis of SARS-CoV-2 by FDA under an Emergency Use Authorization (EUA). This EUA will remain  in effect (meaning this test can be used) for the duration of the  COVID-19 declaration under Section 56 4(b)(1) of the Act, 21  U.S.C. section 360bbb-3(b)(1), unless the authorization is terminated or revoked sooner. Performed at [facility], [place] [address], [place], [place] [address]   MRSA PCR Screening     Status: None   Collection Time: 12/31/18  8:42 AM   Specimen: Nasal Mucosa; Nasopharyngeal  Result Value Ref Range Status   MRSA by PCR NEGATIVE NEGATIVE Final    Comment:        The GeneXpert MRSA Assay (FDA approved for NASAL specimens only), is one component of a comprehensive MRSA colonization surveillance program. It is not intended to diagnose MRSA infection nor to guide or monitor treatment for MRSA infections. Performed at [facility], [address] [place], [place] [address]   MRSA PCR Screening     Status: None   Collection Time: 24-Jan-2019  2:10 AM   Specimen: Nasal Mucosa; Nasopharyngeal  Result Value Ref Range Status   MRSA by PCR NEGATIVE NEGATIVE Final    Comment:        The GeneXpert MRSA Assay (FDA approved for NASAL specimens only), is one component of a comprehensive MRSA colonization surveillance program. It is not intended to diagnose MRSA infection nor to guide or monitor treatment for MRSA infections. Performed at [facility], [address] [place], [place] [address]   Culture, respiratory (non-expectorated)     Status: None   Collection Time: 2019/01/24  3:49 AM   Specimen: Tracheal Aspirate; Respiratory  Result Value Ref Range Status   Specimen Description   Final    TRACHEAL ASPIRATE Performed at [facility], [address], [place], [place] [address]    Special Requests   Final    NONE Performed at [facility], [address] [place], [place] [address]    Gram Stain   Final    RARE WBC PRESENT, PREDOMINANTLY PMN MODERATE YEAST Performed at [facility], [place] [address], [place], [place] [address]    Culture MODERATE CANDIDA GLABRATA  Final   Report Status 12/28/2018 FINAL  Final  CULTURE, BLOOD  (ROUTINE X 2) w Reflex to ID Panel     Status: None (Preliminary result)   Collection Time: 24-Jan-2019  4:41 AM   Specimen: BLOOD  Result Value Ref Range Status   Specimen Description BLOOD LEFT ANTECUBITAL  Final   Special Requests   Final    BOTTLES DRAWN AEROBIC AND ANAEROBIC Blood Culture adequate volume   Culture   Final    NO GROWTH 4 DAYS Performed at [facility], [address] [place], [place] [address]    Report Status PENDING  Incomplete  CULTURE, BLOOD (ROUTINE X 2) w Reflex to ID Panel     Status: None (Preliminary result)   Collection Time: 01-24-19  4:41 AM   Specimen: BLOOD  Result Value Ref Range Status   Specimen Description BLOOD LEFT FOREARM  Final   Special Requests   Final    BOTTLES DRAWN AEROBIC AND ANAEROBIC Blood Culture adequate volume   Culture   Final    NO GROWTH 4 DAYS Performed at [facility], [address], [place], [place] [address]    Report Status PENDING  Incomplete    Lab Basic Metabolic Panel: Recent Labs  Lab 01/24/2019 0441 01/06/19 2125 01/07/19 0415 12/12/2018 0425  NA 155* 147* 148* 149*  K 4.0 3.2* 3.8 3.1*  CL 125* 122* 123* 124*  CO2 16* 13* 13* 13*  GLUCOSE 131* 251* 214* 153*  BUN 107* 106* 115* 125*  CREATININE 3.31* 3.84* 3.80* 3.96*  CALCIUM 8.4* 6.4* 6.7* 6.3*  MG 2.7*  --  2.1  --   PHOS 5.7*  --  5.5*  --    Liver Function Tests: Recent Labs  Lab 12/22/2018 0441 01/07/19 0415  AST 90*  --   ALT 6  --   ALKPHOS 66  --   BILITOT 0.7  --   PROT 5.0*  --   ALBUMIN 2.1* 1.6*   No results for input(s): LIPASE, AMYLASE in the last 168 hours. No results for input(s): AMMONIA in the last 168 hours. CBC: Recent Labs  Lab 12/14/2018 0441 01/06/19 1936 01/07/19 0415 12/25/2018 0425  WBC 8.3 18.3* 21.0* 12.0*  NEUTROABS 7.7 16.8*  --   --   HGB 12.8* 10.8* 11.2* 9.7*  HCT 40.1 32.0* 33.8* 28.6*  MCV 96.4 92.8 93.1 91.1  PLT 203 187 191 124*   Cardiac Enzymes: No results for input(s):  CKTOTAL, CKMB, CKMBINDEX, TROPONINI in the last 168 hours. Sepsis Labs: Recent Labs  Lab 01/02/19 1232 12/12/2018 0441 01/06/19 1936 01/07/19 0415 01/11/2019 0425  PROCALCITON 27.67 16.12  --  >150.00 144.40  WBC  --  8.3 18.3* 21.0* 12.0*  LATICACIDVEN  --  2.4*  --   --   --      Kyngston Pickelsimer 01/09/2019, 7:15 AM

## 2019-01-12 NOTE — Progress Notes (Signed)
Extubation orders written.  Patient extubated to comfort measures @ 15:00.  RN present for extubation.

## 2019-01-12 DEATH — deceased

## 2019-01-15 ENCOUNTER — Ambulatory Visit: Payer: Self-pay | Admitting: *Deleted

## 2019-01-22 ENCOUNTER — Telehealth: Payer: Self-pay

## 2019-01-22 NOTE — Telephone Encounter (Signed)
Received fax death certificate from [facility]. Death certificate has been placed in DK's folder.

## 2019-01-24 NOTE — Telephone Encounter (Signed)
Death certificate has been faxed to [facility] at [phone].

## 2019-04-03 ENCOUNTER — Telehealth: Payer: Self-pay | Admitting: Internal Medicine

## 2019-04-03 NOTE — Telephone Encounter (Signed)
Completed death certificate has been placed in outgoing mail.  Alan Peterson is aware of results.  Nothing further is needed.

## 2020-12-11 IMAGING — DX DG CHEST 1V PORT
1 series · 1 of 1 positions shown · non-contrast
Comparison: None.

CLINICAL DATA: Chest pain, shortness of breath.

EXAM:
PORTABLE CHEST 1 VIEW

[chest ap]
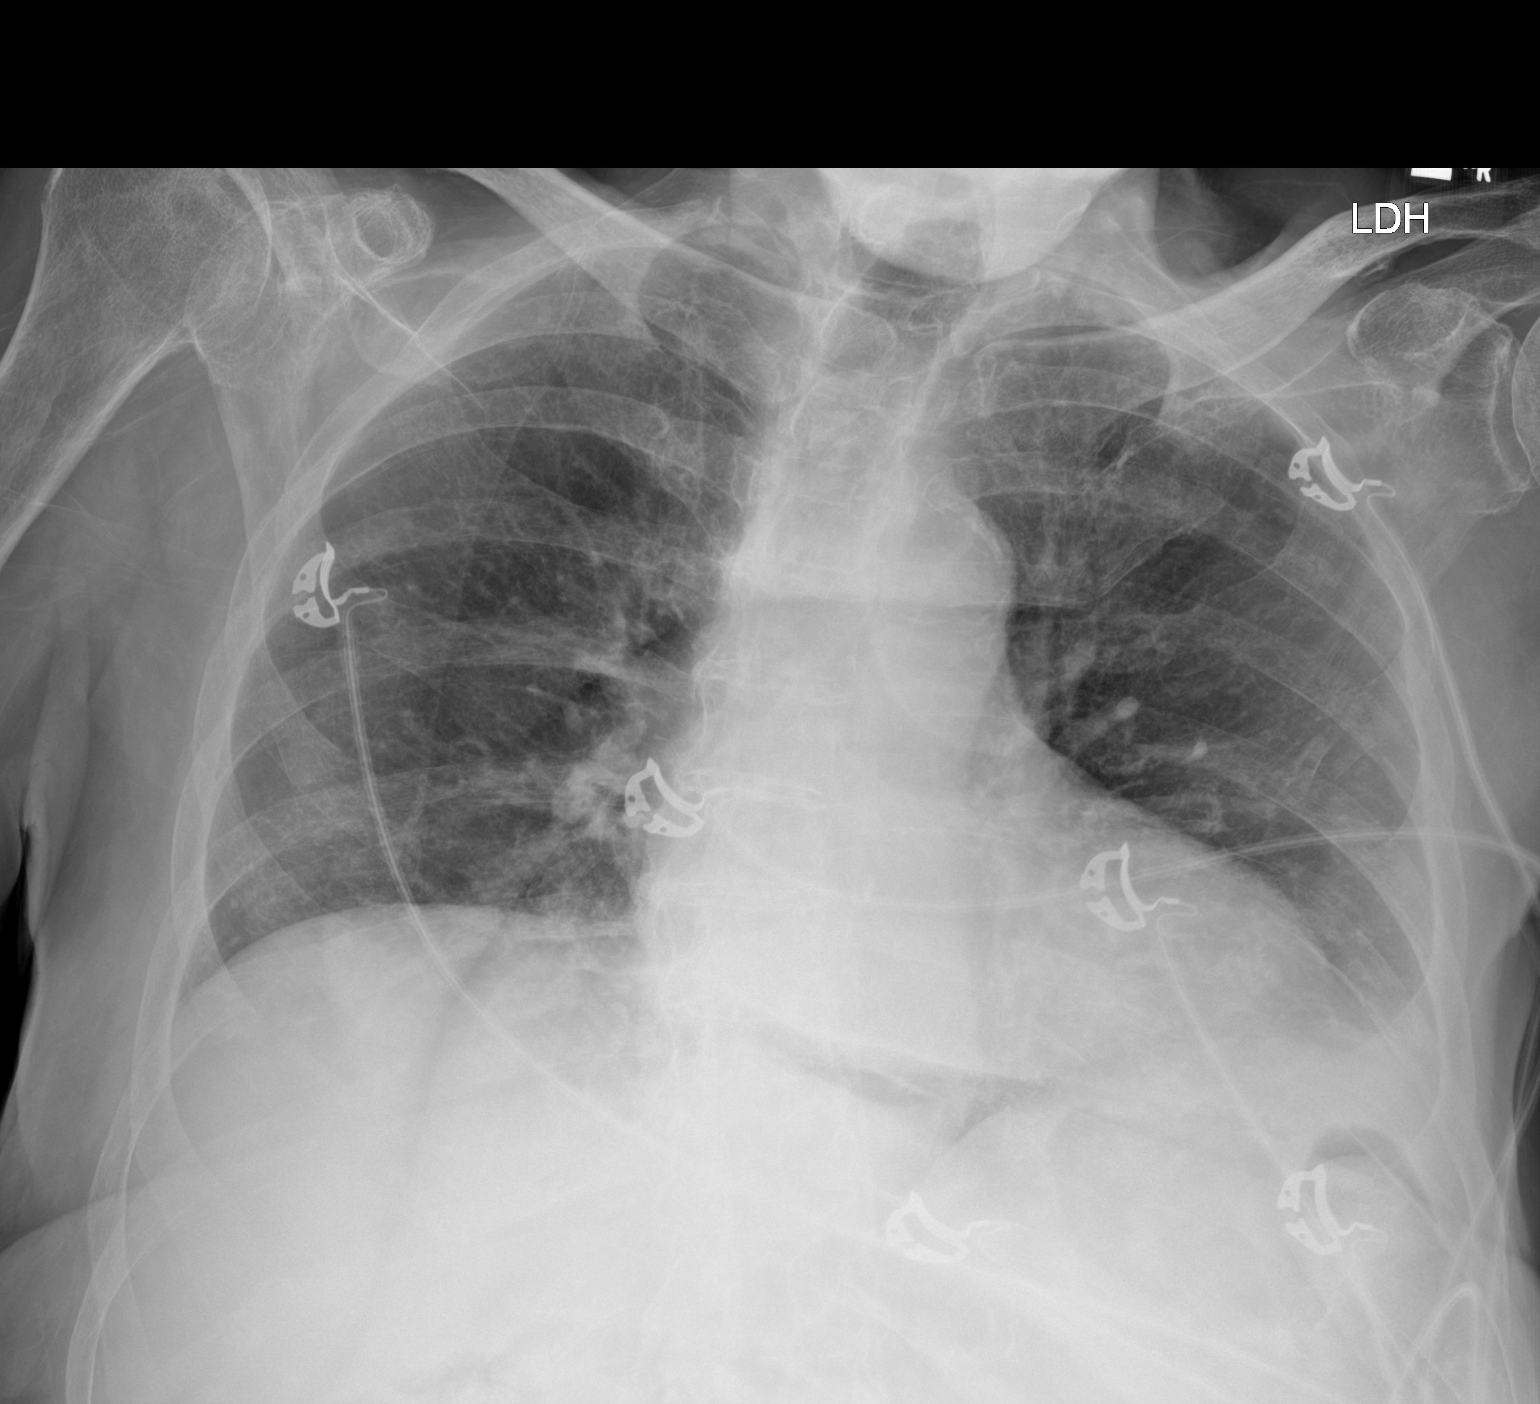

[1 of 1 positions shown; findings below may reference images not displayed]

FINDINGS: The heart size and mediastinal contours are within normal limits.
Right lung is clear. No pneumothorax is noted. Mild left basilar
atelectasis is noted with probable small left pleural effusion. The
visualized skeletal structures are unremarkable.
IMPRESSION: Mild left basilar subsegmental atelectasis with probable small left
pleural effusion.

## 2020-12-14 IMAGING — CT CT HEAD W/O CM
1 of 2 series · 15 of 30 positions shown, 19 images · non-contrast
Comparison: 08/24/2017

CLINICAL DATA: Altered level of consciousness and weakness

EXAM:
CT HEAD WITHOUT CONTRAST
TECHNIQUE: Contiguous axial images were obtained from the base of the skull
through the vertex without intravenous contrast.

[Series 3: head 5.0 h31s · axial · 0.46mm/px · z∈[-202,-36]mm · 15 of 39 slices shown, 19 images]
[im 3/39  brain]
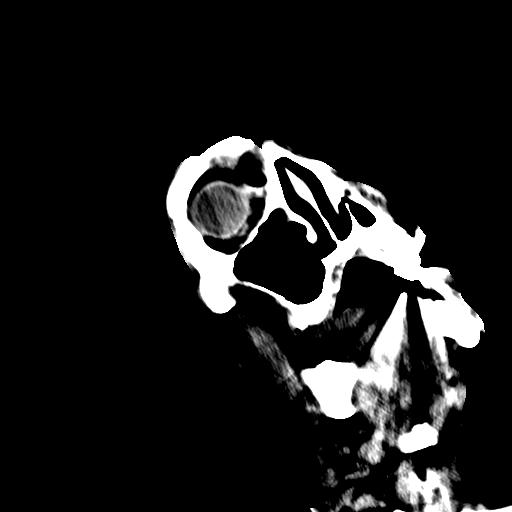
[im 3/39  bone]
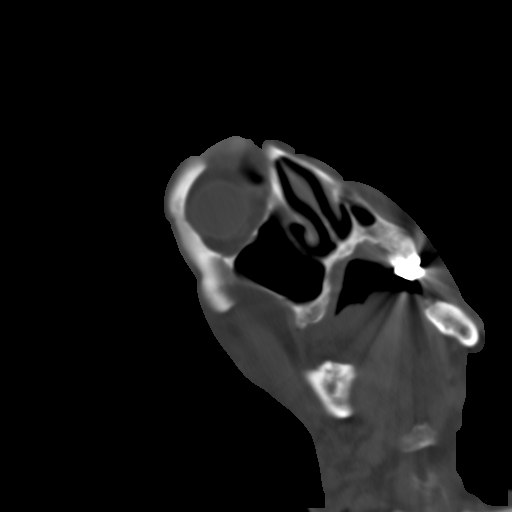
[im 5/39  brain]
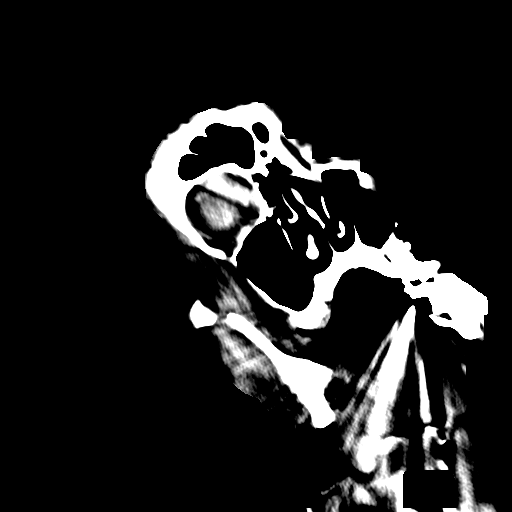
[im 7/39  brain]
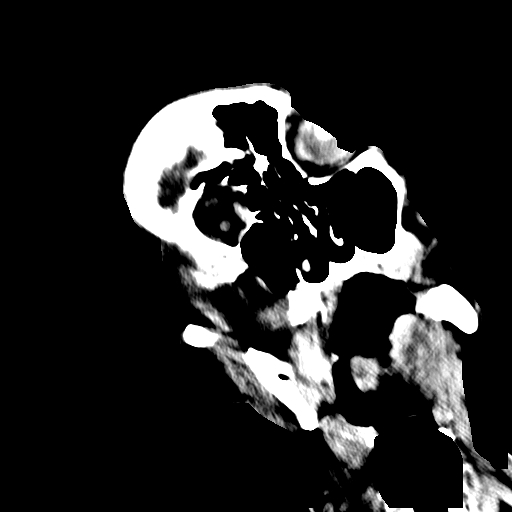
[im 9/39  brain]
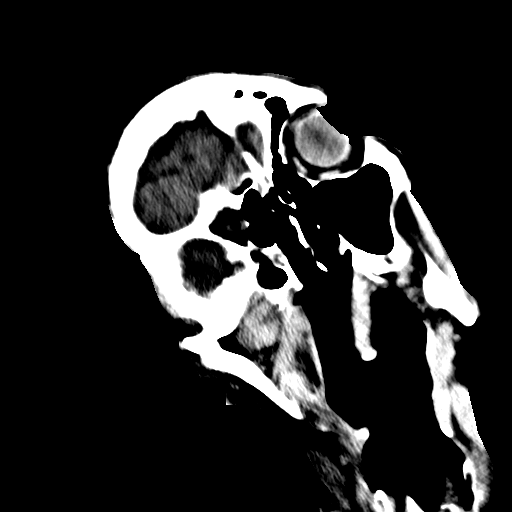
[im 13/39  brain]
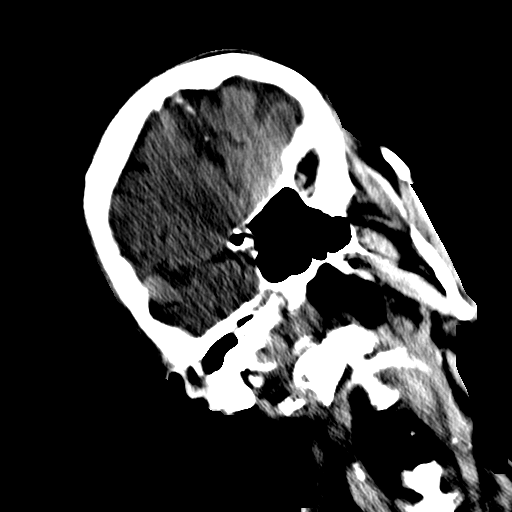
[im 13/39  bone]
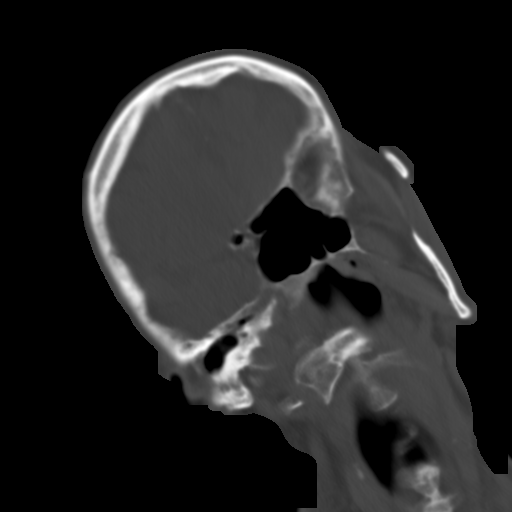
[im 15/39  brain]
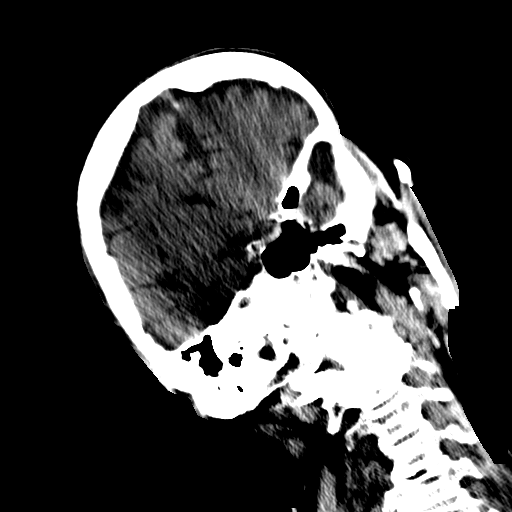
[im 17/39  brain]
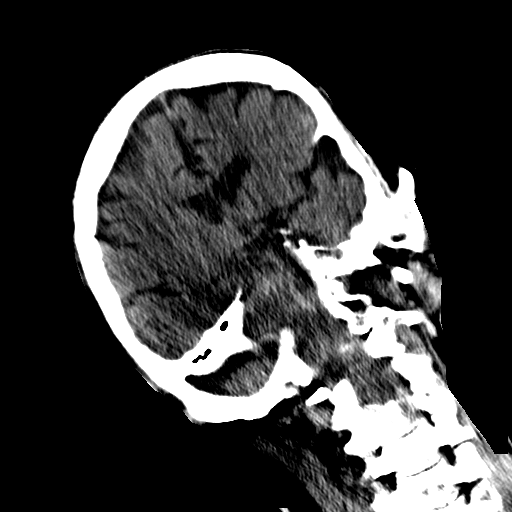
[im 20/39  brain]
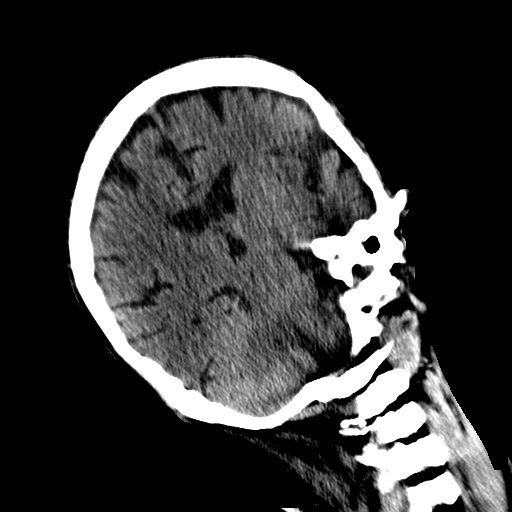
[im 22/39  brain]
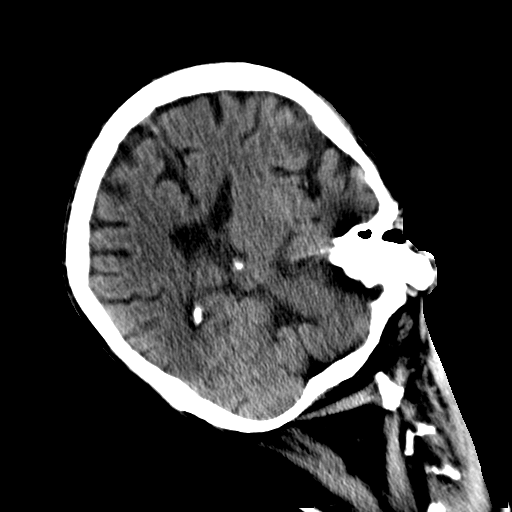
[im 22/39  bone]
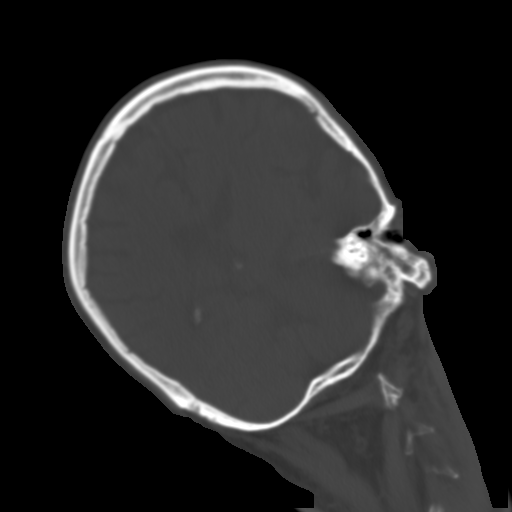
[im 24/39  brain]
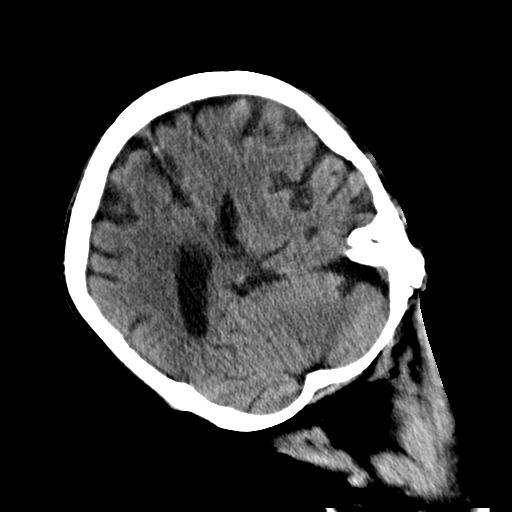
[im 26/39  brain]
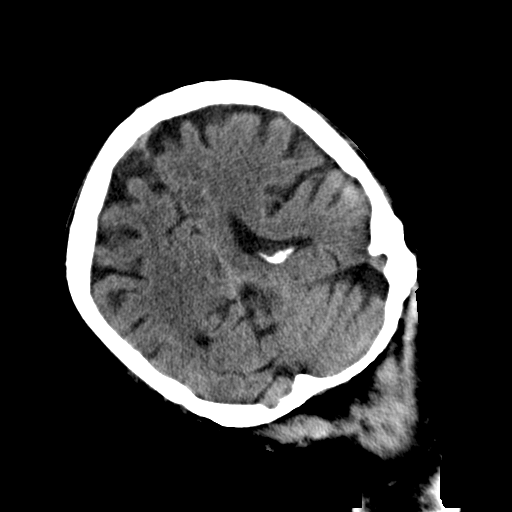
[im 30/39  brain]
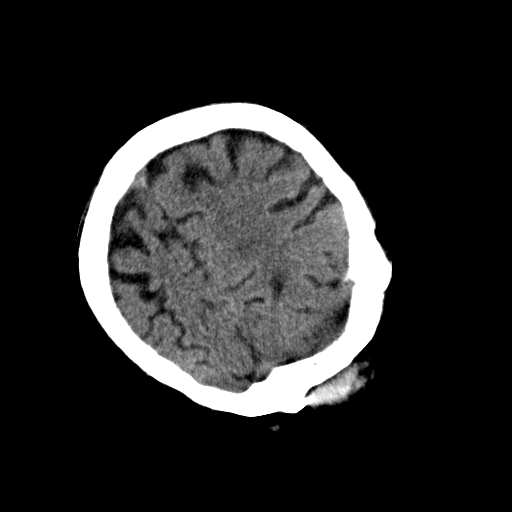
[im 32/39  brain]
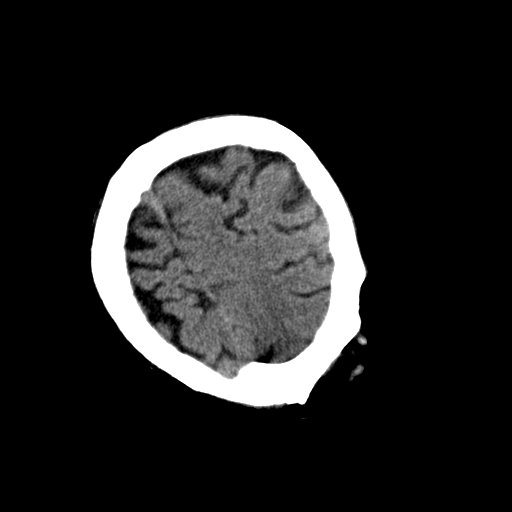
[im 32/39  bone]
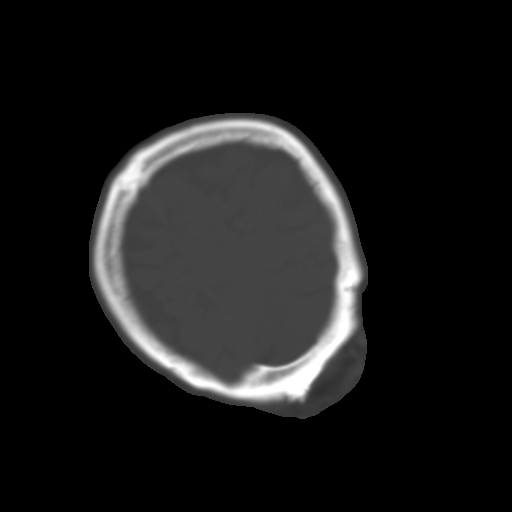
[im 34/39  brain]
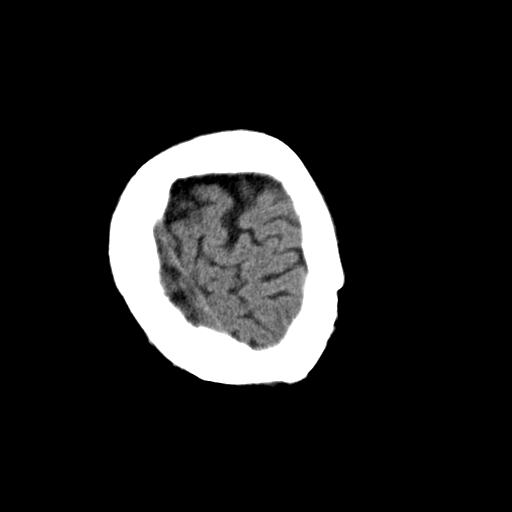
[im 36/39  brain]
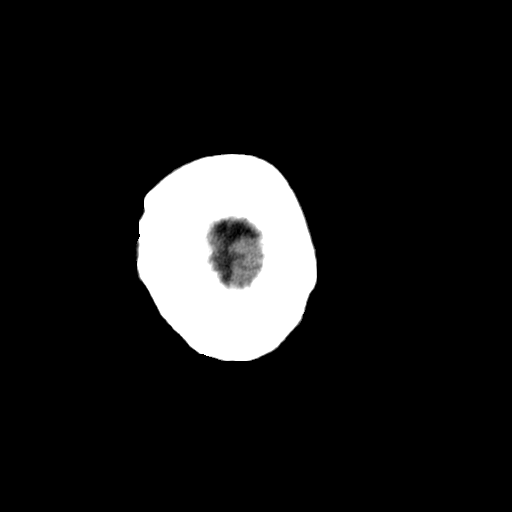

[15 of 30 positions shown; findings below may reference images not displayed]

FINDINGS: Brain: Considerable motion artifact is identified with severe
limitations on the exam. There is persistent increased density along
the anterior cranial fossa particularly on the left similar to that
seen on the prior exam. Considerable beam hardening artifact is
identified limiting the examination although the persistent density
is suspicious for underlying meningioma. If the patient can undergo
MRI examination that would be helpful. Diffuse atrophic changes are
seen. Chronic white matter ischemic changes are noted. No findings
to suggest acute hemorrhage or acute infarction are seen.

Vascular: No hyperdense vessel or unexpected calcification.

Skull: Normal. Negative for fracture or focal lesion.

Sinuses/Orbits: No acute finding.

Other: Stable fluid within the mastoid air cells on the left.
IMPRESSION: Chronic atrophic and ischemic changes stable from previous exam.

Persistent increased density is noted along the base of the anterior
cranial fossa particularly on the left. Although this is likely
related to underlying beam hardening artifact the possibility of a
meningioma could not be totally excluded. MRI would be helpful in
this regard.
